# Patient Record
Sex: Male | Born: 1955 | Race: White | Hispanic: No | Marital: Married | Smoking: Never smoker
Health system: Southern US, Community
[De-identification: ages and names within clinical notes are randomized; demographics above are authoritative.]

## PROBLEM LIST (undated history)

## (undated) DIAGNOSIS — I10 Essential (primary) hypertension: Secondary | ICD-10-CM

## (undated) DIAGNOSIS — A498 Other bacterial infections of unspecified site: Secondary | ICD-10-CM

## (undated) HISTORY — PX: APPENDECTOMY: SHX54

## (undated) HISTORY — PX: KNEE SURGERY: SHX244

## (undated) HISTORY — PX: TONSILLECTOMY: SUR1361

---

## 2013-04-23 ENCOUNTER — Emergency Department: Payer: Self-pay | Admitting: Emergency Medicine

## 2013-04-23 LAB — CBC
HCT: 46.4 % (ref 40.0–52.0)
HGB: 16.2 g/dL (ref 13.0–18.0)
MCH: 33.5 pg (ref 26.0–34.0)
MCHC: 34.9 g/dL (ref 32.0–36.0)
MCV: 96 fL (ref 80–100)
Platelet: 188 10*3/uL (ref 150–440)
RBC: 4.83 10*6/uL (ref 4.40–5.90)
RDW: 13 % (ref 11.5–14.5)
WBC: 9.4 10*3/uL (ref 3.8–10.6)

## 2013-04-23 LAB — COMPREHENSIVE METABOLIC PANEL
ALBUMIN: 4.1 g/dL (ref 3.4–5.0)
ALK PHOS: 72 U/L
ALT: 34 U/L (ref 12–78)
ANION GAP: 7 (ref 7–16)
BUN: 6 mg/dL — ABNORMAL LOW (ref 7–18)
Bilirubin,Total: 0.9 mg/dL (ref 0.2–1.0)
Calcium, Total: 8.2 mg/dL — ABNORMAL LOW (ref 8.5–10.1)
Chloride: 95 mmol/L — ABNORMAL LOW (ref 98–107)
Co2: 26 mmol/L (ref 21–32)
Creatinine: 0.67 mg/dL (ref 0.60–1.30)
EGFR (African American): >60
EGFR (Non-African Amer.): >60
GLUCOSE: 98 mg/dL (ref 65–99)
Osmolality: 255 (ref 275–301)
Potassium: 3.6 mmol/L (ref 3.5–5.1)
SGOT(AST): 25 U/L (ref 15–37)
Sodium: 128 mmol/L — ABNORMAL LOW (ref 136–145)
Total Protein: 7.6 g/dL (ref 6.4–8.2)

## 2013-04-23 LAB — TROPONIN I: Troponin-I: <0.02 ng/mL

## 2013-05-22 ENCOUNTER — Ambulatory Visit: Payer: Self-pay | Admitting: Otolaryngology

## 2015-01-26 ENCOUNTER — Emergency Department
Admission: EM | Admit: 2015-01-26 | Discharge: 2015-01-26 | Disposition: A | Discharge status: Home / Self Care | Payer: BLUE CROSS/BLUE SHIELD | Attending: Emergency Medicine | Admitting: Emergency Medicine

## 2015-01-26 ENCOUNTER — Encounter: Payer: Self-pay | Admitting: Emergency Medicine

## 2015-01-26 DIAGNOSIS — I10 Essential (primary) hypertension: Secondary | ICD-10-CM | POA: Diagnosis not present

## 2015-01-26 DIAGNOSIS — Y9389 Activity, other specified: Secondary | ICD-10-CM | POA: Insufficient documentation

## 2015-01-26 DIAGNOSIS — Y998 Other external cause status: Secondary | ICD-10-CM | POA: Diagnosis not present

## 2015-01-26 DIAGNOSIS — S99921A Unspecified injury of right foot, initial encounter: Secondary | ICD-10-CM | POA: Diagnosis present

## 2015-01-26 DIAGNOSIS — Y9289 Other specified places as the place of occurrence of the external cause: Secondary | ICD-10-CM | POA: Insufficient documentation

## 2015-01-26 DIAGNOSIS — W208XXA Other cause of strike by thrown, projected or falling object, initial encounter: Secondary | ICD-10-CM | POA: Insufficient documentation

## 2015-01-26 DIAGNOSIS — S92321A Displaced fracture of second metatarsal bone, right foot, initial encounter for closed fracture: Secondary | ICD-10-CM | POA: Diagnosis not present

## 2015-01-26 DIAGNOSIS — S92301A Fracture of unspecified metatarsal bone(s), right foot, initial encounter for closed fracture: Secondary | ICD-10-CM

## 2015-01-26 HISTORY — DX: Essential (primary) hypertension: I10

## 2015-01-26 MED ORDER — OXYCODONE-ACETAMINOPHEN 5-325 MG PO TABS
1.0000 | ORAL_TABLET | Freq: Once | ORAL | Status: AC
  Administered 2015-01-26: 1 via ORAL
  Filled 2015-01-26: qty 1

## 2015-01-26 MED ORDER — OXYCODONE-ACETAMINOPHEN 7.5-325 MG PO TABS: 1.0000 | ORAL_TABLET | Freq: Four times a day (QID) | ORAL | Status: DC | PRN

## 2015-01-26 MED ORDER — IBUPROFEN 800 MG PO TABS
800.0000 mg | ORAL_TABLET | Freq: Once | ORAL | Status: AC
  Administered 2015-01-26: 800 mg via ORAL
  Filled 2015-01-26: qty 1

## 2015-01-26 MED ORDER — IBUPROFEN 800 MG PO TABS: 800.0000 mg | ORAL_TABLET | Freq: Three times a day (TID) | ORAL | Status: DC | PRN

## 2015-01-26 NOTE — ED Notes (Signed)
States he had a block of wood drop on right foot  Sent in from [facility]

## 2015-01-26 NOTE — Discharge Instructions (Signed)
Metatarsal Fracture A metatarsal fracture is a break in a metatarsal bone. Metatarsal bones connect your toe bones to your ankle bones. CAUSES This type of fracture may be caused by:  A sudden twisting of your foot.  A fall onto your foot.  Overuse or repetitive exercise. RISK FACTORS This condition is more likely to develop in people who:  Play contact sports.  Have a bone disease.  Have a low calcium level. SYMPTOMS Symptoms of this condition include:  Pain that is worse when walking or standing.  Pain when pressing on the foot or moving the toes.  Swelling.  Bruising on the top or bottom of the foot.  A foot that appears shorter than the other one. DIAGNOSIS This condition is diagnosed with a physical exam. You may also have imaging tests, such as:  X-rays.  A CT scan.  MRI. TREATMENT Treatment for this condition depends on its severity and whether a bone has moved out of place. Treatment may involve:  Rest.  Wearing foot support such as a cast, splint, or boot for several weeks.  Using crutches.  Surgery to move bones back into the right position. Surgery is usually needed if there are many pieces of broken bone or bones that are very out of place (displaced fracture).  Physical therapy. This may be needed to help you regain full movement and strength in your foot. You will need to return to your health care provider to have X-rays taken until your bones heal. Your health care provider will look at the X-rays to make sure that your foot is healing well. HOME CARE INSTRUCTIONS  If You Have a Cast:  Do not stick anything inside the cast to scratch your skin. Doing that increases your risk of infection.  Check the skin around the cast every day. Report any concerns to your health care provider. You may put lotion on dry skin around the edges of the cast. Do not apply lotion to the skin underneath the cast.  Keep the cast clean and dry. If You Have a Splint  or a Supportive Boot:  Wear it as directed by your health care provider. Remove it only as directed by your health care provider.  Loosen it if your toes become numb and tingle, or if they turn cold and blue.  Keep it clean and dry. Bathing  Do not take baths, swim, or use a hot tub until your health care provider approves. Ask your health care provider if you can take showers. You may only be allowed to take sponge baths for bathing.  If your health care provider approves bathing and showering, cover the cast or splint with a watertight plastic bag to protect it from water. Do not let the cast or splint get wet. Managing Pain, Stiffness, and Swelling  If directed, apply ice to the injured area (if you have a splint, not a cast).  Put ice in a plastic bag.  Place a towel between your skin and the bag.  Leave the ice on for 20 minutes, 2-3 times per day.  Move your toes often to avoid stiffness and to lessen swelling.  Raise (elevate) the injured area above the level of your heart while you are sitting or lying down. Driving  Do not drive or operate heavy machinery while taking pain medicine.  Do not drive while wearing foot support on a foot that you use for driving. Activity  Return to your normal activities as directed by your health care   provider. Ask your health care provider what activities are safe for you.  Perform exercises as directed by your health care provider or physical therapist. Safety  Do not use the injured foot to support your body weight until your health care provider says that you can. Use crutches as directed by your health care provider. General Instructions  Do not put pressure on any part of the cast or splint until it is fully hardened. This may take several hours.  Do not use any tobacco products, including cigarettes, chewing tobacco, or e-cigarettes. Tobacco can delay bone healing. If you need help quitting, ask your health care  provider.  Take medicines only as directed by your health care provider.  Keep all follow-up visits as directed by your health care provider. This is important. SEEK MEDICAL CARE IF:  You have a fever.  Your cast, splint, or boot is too loose or too tight.  Your cast, splint, or boot is damaged.  Your pain medicine is not helping.  You have pain, tingling, or numbness in your foot that is not going away. SEEK IMMEDIATE MEDICAL CARE IF:  You have severe pain.  You have tingling or numbness in your foot that is getting worse.  Your foot feels cold or becomes numb.  Your foot changes color.   This information is not intended to replace advice given to you by your health care provider. Make sure you discuss any questions you have with your health care provider.   Document Released: 11/11/2001 Document Revised: 07/06/2014 Document Reviewed: 12/16/2013 Elsevier Interactive Patient Education 2016 [employer]  

## 2015-01-26 NOTE — ED Provider Notes (Signed)
[facility] Emergency Department Provider Note  ____________________________________________  Time seen: Approximately 11:24 AM  I have reviewed the triage vital signs and the nursing notes.   HISTORY  Chief Complaint Foot Pain    HPI Madison HickmanDonald L Dykema is a 59 y.o. male patient complaining of right foot pain secondary dropped a block of wood on his foot. Patient stated there was pain mild edema but he was able to ambulate and went to work whole shift that evening. Patient states pain continued he went to an urgent care clinic and x-rays were taken x-rays reveal a mild displaced fracture involving the distal portion of the right second metatarsal. It was noted fractures that involve the metatarsophalangeal joint. Patient was sent from urgent care to the ER for further evaluation and treatment. Patient was given crutches with nonweightbearing by urgent care clinic. No medications were prescribed. Patient is rating his pain discomfort as  8/10.  Past Medical History  Diagnosis Date  . Hypertension     There are no active problems to display for this patient.   Past Surgical History  Procedure Laterality Date  . Tonsillectomy    . Appendectomy    . Knee surgery Left     No current outpatient prescriptions on file.  Allergies Review of patient's allergies indicates no known allergies.  No family history on file.  Social History Social History  Substance Use Topics  . Smoking status: Never Smoker   . Smokeless tobacco: None  . Alcohol Use: No    Review of Systems Constitutional: No fever/chills Eyes: No visual changes. ENT: No sore throat. Cardiovascular: Denies chest pain. Respiratory: Denies shortness of breath. Gastrointestinal: No abdominal pain.  No nausea, no vomiting.  No diarrhea.  No constipation. Genitourinary: Negative for dysuria. Musculoskeletal: Right foot pain Skin: Negative for rash. Neurological: Negative for headaches, focal  weakness or numbness. Endocrine:Hypertension 10-point ROS otherwise negative.  ____________________________________________   PHYSICAL EXAM:  VITAL SIGNS: ED Triage Vitals  Enc Vitals Group     BP 01/26/15 1108 146/72 mmHg     Pulse Rate 01/26/15 1108 92     Resp 01/26/15 1108 18     Temp 01/26/15 1108 98.2 F (36.8 C)     Temp Source 01/26/15 1108 Oral     SpO2 01/26/15 1108 98 %     Weight 01/26/15 1108 166 lb (75.297 kg)     Height 01/26/15 1108 5\' 9"  (1.753 m)     Head Cir --      Peak Flow --      Pain Score 01/26/15 1108 8     Pain Loc --      Pain Edu? --      Excl. in GC? --     Constitutional: Alert and oriented. Well appearing and in no acute distress. Eyes: Conjunctivae are normal. PERRL. EOMI. Head: Atraumatic. Nose: No congestion/rhinnorhea. Mouth/Throat: Mucous membranes are moist.  Oropharynx non-erythematous. Neck: No stridor. No cervical spine tenderness to palpation. Hematological/Lymphatic/Immunilogical: No cervical lymphadenopathy. Cardiovascular: Normal rate, regular rhythm. Grossly normal heart sounds.  Good peripheral circulation. Respiratory: Normal respiratory effort.  No retractions. Lungs CTAB. Gastrointestinal: Soft and nontender. No distention. No abdominal bruits. No CVA tenderness. Musculoskeletal: No lower extremity tenderness nor edema.  No joint effusions. Neurologic:  Normal speech and language. No gross focal neurologic deficits are appreciated. No gait instability. Skin:  Skin is warm, dry and intact. No rash noted. Psychiatric: Mood and affect are normal. Speech and behavior are normal.  ____________________________________________   LABS (all labs ordered are listed, but only abnormal results are displayed)  Labs Reviewed - No data to display ____________________________________________  EKG   ____________________________________________  RADIOLOGY  Reviewed x-ray findings from urgent care clinic  . ____________________________________________   PROCEDURES  Procedure(s) performed: None  Critical Care performed: No  ____________________________________________   INITIAL IMPRESSION / ASSESSMENT AND PLAN / ED COURSE  Pertinent labs & imaging results that were available during my care of the patient were reviewed by me and considered in my medical decision making (see chart for details).  Right mild displaced second metatarsal fracture. Patient given a cast shoe and advised to continue ambulating with supportive crutches. Patient given a prescription for Percocets and ibuprofen. Patient advised to contact Dr. Rondel Baton office for follow-up appointment. ____________________________________________   FINAL CLINICAL IMPRESSION(S) / ED DIAGNOSES  Final diagnoses:  Fracture of second metatarsal bone, right, closed, initial encounter      Joni Reining, PA-C 01/26/15 1146  Sharman Cheek, MD 01/26/15 1527

## 2015-09-18 ENCOUNTER — Other Ambulatory Visit: Payer: Self-pay | Admitting: Family Medicine

## 2015-11-03 ENCOUNTER — Other Ambulatory Visit: Payer: Self-pay | Admitting: Family Medicine

## 2015-11-04 ENCOUNTER — Other Ambulatory Visit: Payer: Self-pay | Admitting: Family Medicine

## 2016-02-04 ENCOUNTER — Other Ambulatory Visit: Payer: Self-pay | Admitting: Family Medicine

## 2016-05-06 ENCOUNTER — Other Ambulatory Visit: Payer: Self-pay | Admitting: Family Medicine

## 2016-06-08 ENCOUNTER — Other Ambulatory Visit: Payer: Self-pay | Admitting: Family Medicine

## 2016-07-08 ENCOUNTER — Other Ambulatory Visit: Payer: Self-pay | Admitting: Family Medicine

## 2016-07-12 NOTE — Telephone Encounter (Signed)
Patient has not seen bob in over 2 years. He needs to schedule follow up with Nadine CountsBob before we can approve prescription refill.

## 2016-07-13 NOTE — Telephone Encounter (Signed)
[id number] 

## 2016-07-17 NOTE — Telephone Encounter (Signed)
Patient scheduled for 07/20/16

## 2016-07-18 ENCOUNTER — Telehealth: Payer: Self-pay | Admitting: Family Medicine

## 2016-07-18 ENCOUNTER — Other Ambulatory Visit: Payer: Self-pay | Admitting: Family Medicine

## 2016-07-18 NOTE — Telephone Encounter (Signed)
Please review. KW 

## 2016-07-18 NOTE — Telephone Encounter (Signed)
I sent this in yesterday

## 2016-07-18 NOTE — Telephone Encounter (Signed)
[facility] pharmacy faxed a request for the following medication. Thanks CC ° ° °lisinopril (PRINIVIL,ZESTRIL) 10 MG tablet  °Take 1 tablet by mouth every day. °

## 2016-07-20 ENCOUNTER — Ambulatory Visit: Payer: Self-pay | Admitting: Family Medicine

## 2016-08-10 ENCOUNTER — Encounter: Payer: Self-pay | Admitting: Family Medicine

## 2016-08-10 ENCOUNTER — Other Ambulatory Visit: Payer: Self-pay | Admitting: Family Medicine

## 2016-08-10 ENCOUNTER — Ambulatory Visit (INDEPENDENT_AMBULATORY_CARE_PROVIDER_SITE_OTHER): Payer: BLUE CROSS/BLUE SHIELD | Admitting: Family Medicine

## 2016-08-10 VITALS — BP 166/96 | HR 78 | Temp 97.9°F | Resp 16 | Wt 167.0 lb

## 2016-08-10 DIAGNOSIS — E785 Hyperlipidemia, unspecified: Secondary | ICD-10-CM | POA: Insufficient documentation

## 2016-08-10 DIAGNOSIS — I10 Essential (primary) hypertension: Secondary | ICD-10-CM | POA: Diagnosis not present

## 2016-08-10 HISTORY — DX: Hyperlipidemia, unspecified: E78.5

## 2016-08-10 MED ORDER — LISINOPRIL 20 MG PO TABS
20.0000 mg | ORAL_TABLET | Freq: Every day | ORAL | 3 refills | Status: DC
Start: 2016-08-10 — End: 2017-08-12

## 2016-08-10 NOTE — Progress Notes (Signed)
Subjective:     Patient ID: Madison HickmanDonald L Pasquarello, male   DOB: 08/31/1955, 61 y.o.   MRN: [id number]  HPI  Chief Complaint  Patient presents with  . 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 He has been taking lisinopril 10 mg for many years and thinks he needs his medication adjusted.   Reports compliance with medication. Defers lipid screen: "I don't want to be on cholesterol medication" and defers other health maintenance at this time. 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   Review of Systems  Respiratory: Negative for shortness of breath.   Cardiovascular: Negative for chest pain and palpitations.       Objective:   Physical Exam  Constitutional: He appears well-developed and well-nourished. No distress.  Cardiovascular: Normal rate and regular rhythm.   Pulmonary/Chest: Breath sounds normal.  Musculoskeletal: He exhibits no edema (of lower extremities).       Assessment:    1. Essential hypertension; increase dose for improved control - lisinopril (PRINIVIL,ZESTRIL) 20 MG tablet; Take 1 tablet (20 mg total) by mouth daily.  Dispense: 90 tablet; Refill: 3 - Renal function panel    Plan:    Further f/u pending lab work.

## 2016-08-10 NOTE — Patient Instructions (Signed)
We will call you with the lab results. 

## 2016-09-07 ENCOUNTER — Ambulatory Visit: Payer: BLUE CROSS/BLUE SHIELD | Admitting: Family Medicine

## 2017-08-12 ENCOUNTER — Other Ambulatory Visit: Payer: Self-pay | Admitting: Family Medicine

## 2017-08-12 DIAGNOSIS — I10 Essential (primary) hypertension: Secondary | ICD-10-CM

## 2017-11-07 ENCOUNTER — Other Ambulatory Visit: Payer: Self-pay | Admitting: Family Medicine

## 2017-11-07 DIAGNOSIS — I10 Essential (primary) hypertension: Secondary | ICD-10-CM

## 2019-03-19 ENCOUNTER — Ambulatory Visit: Payer: BC Managed Care – PPO | Admitting: Adult Health

## 2019-03-19 ENCOUNTER — Other Ambulatory Visit: Payer: Self-pay

## 2019-03-19 ENCOUNTER — Encounter: Payer: Self-pay | Admitting: Adult Health

## 2019-03-19 VITALS — BP 130/102 | HR 76 | Temp 96.8°F | Resp 16 | Wt 183.2 lb

## 2019-03-19 DIAGNOSIS — Z13220 Encounter for screening for lipoid disorders: Secondary | ICD-10-CM

## 2019-03-19 DIAGNOSIS — F419 Anxiety disorder, unspecified: Secondary | ICD-10-CM

## 2019-03-19 DIAGNOSIS — Z1329 Encounter for screening for other suspected endocrine disorder: Secondary | ICD-10-CM | POA: Diagnosis not present

## 2019-03-19 DIAGNOSIS — H5461 Unqualified visual loss, right eye, normal vision left eye: Secondary | ICD-10-CM

## 2019-03-19 DIAGNOSIS — H547 Unspecified visual loss: Secondary | ICD-10-CM

## 2019-03-19 DIAGNOSIS — I10 Essential (primary) hypertension: Secondary | ICD-10-CM | POA: Diagnosis not present

## 2019-03-19 DIAGNOSIS — Z125 Encounter for screening for malignant neoplasm of prostate: Secondary | ICD-10-CM

## 2019-03-19 MED ORDER — LISINOPRIL 20 MG PO TABS: 20.0000 mg | ORAL_TABLET | Freq: Every day | ORAL | 0 refills | Status: DC

## 2019-03-19 NOTE — Patient Instructions (Addendum)
[facility] online can schedule appointment for counseling and even psychiatric care if needed. Counseling always recommended.  Will start Lexapro if labs ok and cleared with [facility].  Records requested.  Monitor blood pressure at home,    Lisinopril Tablets What is this medicine? LISINOPRIL (lyse IN oh pril) is an ACE inhibitor. It treats high blood pressure and heart failure. It can treat heart damage after a heart attack. This medicine may be used for other purposes; ask your health care provider or pharmacist if you have questions. COMMON BRAND NAME(S): Prinivil, Zestril What should I tell my health care provider before I take this medicine? They need to know if you have any of these conditions:  diabetes  heart or blood vessel disease  kidney disease  low blood pressure  previous swelling of the tongue, face, or lips with difficulty breathing, difficulty swallowing, hoarseness, or tightening of the throat  an unusual or allergic reaction to lisinopril, other ACE inhibitors, insect venom, foods, dyes, or preservatives  pregnant or trying to get pregnant  breast-feeding How should I use this medicine? Take this drug by mouth. Take it as directed on the prescription label at the same time every day. You can take it with or without food. If it upsets your stomach, take it with food. Keep taking it unless your health care provider tells you to stop. Talk to your health care provider about the use of this drug in children. While it may be prescribed for children as young as 6 for selected conditions, precautions do apply. Overdosage: If you think you have taken too much of this medicine contact a poison control center or emergency room at once. NOTE: This medicine is only for you. Do not share this medicine with others. What if I miss a dose? If you miss a dose, take it as soon as you can. If it is almost time for your next dose, take only that dose.  Do not take double or extra doses. What may interact with this medicine? Do not take this medicine with any of the following medications:  hymenoptera venom  sacubitril; valsartan This medicines may also interact with the following medications:  aliskiren  angiotensin receptor blockers, like losartan or valsartan  certain medicines for diabetes  diuretics  everolimus  gold compounds  lithium  NSAIDs, medicines for pain and inflammation, like ibuprofen or naproxen  potassium salts or supplements  salt substitutes  sirolimus  temsirolimus This list may not describe all possible interactions. Give your health care provider a list of all the medicines, herbs, non-prescription drugs, or dietary supplements you use. Also tell them if you smoke, drink alcohol, or use illegal drugs. Some items may interact with your medicine. What should I watch for while using this medicine? Visit your doctor or health care professional for regular check ups. Check your blood pressure as directed. Ask your doctor what your blood pressure should be, and when you should contact him or her. Do not treat yourself for coughs, colds, or pain while you are using this medicine without asking your doctor or health care professional for advice. Some ingredients may increase your blood pressure. Women should inform their doctor if they wish to become pregnant or think they might be pregnant. There is a potential for serious side effects to an unborn child. Talk to your health care professional or pharmacist for more information. Check with your doctor or health care professional if you get an attack of severe  diarrhea, nausea and vomiting, or if you sweat a lot. The loss of too much body fluid can make it dangerous for you to take this medicine. You may get drowsy or dizzy. Do not drive, use machinery, or do anything that needs mental alertness until you know how this drug affects you. Do not stand or sit up  quickly, especially if you are an older patient. This reduces the risk of dizzy or fainting spells. Alcohol can make you more drowsy and dizzy. Avoid alcoholic drinks. Avoid salt substitutes unless you are told otherwise by your doctor or health care professional. What side effects may I notice from receiving this medicine? Side effects that you should report to your doctor or health care professional as soon as possible:  allergic reactions like skin rash, itching or hives, swelling of the hands, feet, face, lips, throat, or tongue  breathing problems  signs and symptoms of kidney injury like trouble passing urine or change in the amount of urine  signs and symptoms of increased potassium like muscle weakness; chest pain; or fast, irregular heartbeat  signs and symptoms of liver injury like dark yellow or brown urine; general ill feeling or flu-like symptoms; light-colored stools; loss of appetite; nausea; right upper belly pain; unusually weak or tired; yellowing of the eyes or skin  signs and symptoms of low blood pressure like dizziness; feeling faint or lightheaded, falls; unusually weak or tired  stomach pain with or without nausea and vomiting Side effects that usually do not require medical attention (report to your doctor or health care professional if they continue or are bothersome):  changes in taste  cough  dizziness  fever  headache  sensitivity to light This list may not describe all possible side effects. Call your doctor for medical advice about side effects. You may report side effects to FDA at 1-800-FDA-1088. Where should I keep my medicine? Keep out of the reach of children and pets. Store at room temperature between 20 and 25 degrees C (68 and 77 degrees F). Protect from moisture. Keep the container tightly closed. Do not freeze. Avoid exposure to extreme heat. Throw away any unused drug after the expiration date. NOTE: This sheet is a summary. It may not  cover all possible information. If you have questions about this medicine, talk to your doctor, pharmacist, or health care provider.  2020 Elsevier/Gold Standard (2018-09-24 11:38:35) Hypertension, Adult Hypertension is another name for high blood pressure. High blood pressure forces your heart to work harder to pump blood. This can cause problems over time. There are two numbers in a blood pressure reading. There is a top number (systolic) over a bottom number (diastolic). It is best to have a blood pressure that is below 120/80. Healthy choices can help lower your blood pressure, or you may need medicine to help lower it. What are the causes? The cause of this condition is not known. Some conditions may be related to high blood pressure. What increases the risk?  Smoking.  Having type 2 diabetes mellitus, high cholesterol, or both.  Not getting enough exercise or physical activity.  Being overweight.  Having too much fat, sugar, calories, or salt (sodium) in your diet.  Drinking too much alcohol.  Having long-term (chronic) kidney disease.  Having a family history of high blood pressure.  Age. Risk increases with age.  Race. You may be at higher risk if you are African American.  Gender. Men are at higher risk than women before age 64. After  age 29, women are at higher risk than men.  Having obstructive sleep apnea.  Stress. What are the signs or symptoms?  High blood pressure may not cause symptoms. Very high blood pressure (hypertensive crisis) may cause: ? Headache. ? Feelings of worry or nervousness (anxiety). ? Shortness of breath. ? Nosebleed. ? A feeling of being sick to your stomach (nausea). ? Throwing up (vomiting). ? Changes in how you see. ? Very bad chest pain. ? Seizures. How is this treated?  This condition is treated by making healthy lifestyle changes, such as: ? Eating healthy foods. ? Exercising more. ? Drinking less alcohol.  Your health  care provider may prescribe medicine if lifestyle changes are not enough to get your blood pressure under control, and if: ? Your top number is above 130. ? Your bottom number is above 80.  Your personal target blood pressure may vary. Follow these instructions at home: Eating and drinking   If told, follow the DASH eating plan. To follow this plan: ? Fill one half of your plate at each meal with fruits and vegetables. ? Fill one fourth of your plate at each meal with whole grains. Whole grains include whole-wheat pasta, brown rice, and whole-grain bread. ? Eat or drink low-fat dairy products, such as skim milk or low-fat yogurt. ? Fill one fourth of your plate at each meal with low-fat (lean) proteins. Low-fat proteins include fish, chicken without skin, eggs, beans, and tofu. ? Avoid fatty meat, cured and processed meat, or chicken with skin. ? Avoid pre-made or processed food.  Eat less than 1,500 mg of salt each day.  Do not drink alcohol if: ? Your doctor tells you not to drink. ? You are pregnant, may be pregnant, or are planning to become pregnant.  If you drink alcohol: ? Limit how much you use to:  0-1 drink a day for women.  0-2 drinks a day for men. ? Be aware of how much alcohol is in your drink. In the [place] one drink equals one 12 oz bottle of beer (355 mL), one 5 oz glass of wine (148 mL), or one 1 oz glass of hard liquor (44 mL). Lifestyle   Work with your doctor to stay at a healthy weight or to lose weight. Ask your doctor what the best weight is for you.  Get at least 30 minutes of exercise most days of the week. This may include walking, swimming, or biking.  Get at least 30 minutes of exercise that strengthens your muscles (resistance exercise) at least 3 days a week. This may include lifting weights or doing Pilates.  Do not use any products that contain nicotine or tobacco, such as cigarettes, e-cigarettes, and chewing tobacco. If you need help quitting,  ask your doctor.  Check your blood pressure at home as told by your doctor.  Keep all follow-up visits as told by your doctor. This is important. Medicines  Take over-the-counter and prescription medicines only as told by your doctor. Follow directions carefully.  Do not skip doses of blood pressure medicine. The medicine does not work as well if you skip doses. Skipping doses also puts you at risk for problems.  Ask your doctor about side effects or reactions to medicines that you should watch for. Contact a doctor if you:  Think you are having a reaction to the medicine you are taking.  Have headaches that keep coming back (recurring).  Feel dizzy.  Have swelling in your ankles.  Have trouble  with your vision. Get help right away if you:  Get a very bad headache.  Start to feel mixed up (confused).  Feel weak or numb.  Feel faint.  Have very bad pain in your: ? Chest. ? Belly (abdomen).  Throw up more than once.  Have trouble breathing. Summary  Hypertension is another name for high blood pressure.  High blood pressure forces your heart to work harder to pump blood.  For most people, a normal blood pressure is less than 120/80.  Making healthy choices can help lower blood pressure. If your blood pressure does not get lower with healthy choices, you may need to take medicine. This information is not intended to replace advice given to you by your health care provider. Make sure you discuss any questions you have with your health care provider. Document Revised: 10/30/2017 Document Reviewed: 10/30/2017 Elsevier Patient Education  [place]. Generalized Anxiety Disorder, Adult Generalized anxiety disorder (GAD) is a mental health disorder. People with this condition constantly worry about everyday events. Unlike normal anxiety, worry related to GAD is not triggered by a specific event. These worries also do not fade or get better with time. GAD interferes  with life functions, including relationships, work, and school. GAD can vary from mild to severe. People with severe GAD can have intense waves of anxiety with physical symptoms (panic attacks). What are the causes? The exact cause of GAD is not known. What increases the risk? This condition is more likely to develop in:  Women.  People who have a family history of anxiety disorders.  People who are very shy.  People who experience very stressful life events, such as the death of a loved one.  People who have a very stressful family environment. What are the signs or symptoms? People with GAD often worry excessively about many things in their lives, such as their health and family. They may also be overly concerned about:  Doing well at work.  Being on time.  Natural disasters.  Friendships. Physical symptoms of GAD include:  Fatigue.  Muscle tension or having muscle twitches.  Trembling or feeling shaky.  Being easily startled.  Feeling like your heart is pounding or racing.  Feeling out of breath or like you cannot take a deep breath.  Having trouble falling asleep or staying asleep.  Sweating.  Nausea, diarrhea, or irritable bowel syndrome (IBS).  Headaches.  Trouble concentrating or remembering facts.  Restlessness.  Irritability. How is this diagnosed? Your health care provider can diagnose GAD based on your symptoms and medical history. You will also have a physical exam. The health care provider will ask specific questions about your symptoms, including how severe they are, when they started, and if they come and go. Your health care provider may ask you about your use of alcohol or drugs, including prescription medicines. Your health care provider may refer you to a mental health specialist for further evaluation. Your health care provider will do a thorough examination and may perform additional tests to rule out other possible causes of your  symptoms. To be diagnosed with GAD, a person must have anxiety that:  Is out of his or her control.  Affects several different aspects of his or her life, such as work and relationships.  Causes distress that makes him or her unable to take part in normal activities.  Includes at least three physical symptoms of GAD, such as restlessness, fatigue, trouble concentrating, irritability, muscle tension, or sleep problems. Before your  health care provider can confirm a diagnosis of GAD, these symptoms must be present more days than they are not, and they must last for six months or longer. How is this treated? The following therapies are usually used to treat GAD:  Medicine. Antidepressant medicine is usually prescribed for long-term daily control. Antianxiety medicines may be added in severe cases, especially when panic attacks occur.  Talk therapy (psychotherapy). Certain types of talk therapy can be helpful in treating GAD by providing support, education, and guidance. Options include: ? Cognitive behavioral therapy (CBT). People learn coping skills and techniques to ease their anxiety. They learn to identify unrealistic or negative thoughts and behaviors and to replace them with positive ones. ? Acceptance and commitment therapy (ACT). This treatment teaches people how to be mindful as a way to cope with unwanted thoughts and feelings. ? Biofeedback. This process trains you to manage your body's response (physiological response) through breathing techniques and relaxation methods. You will work with a therapist while machines are used to monitor your physical symptoms.  Stress management techniques. These include yoga, meditation, and exercise. A mental health specialist can help determine which treatment is best for you. Some people see improvement with one type of therapy. However, other people require a combination of therapies. Follow these instructions at home:  Take over-the-counter and  prescription medicines only as told by your health care provider.  Try to maintain a normal routine.  Try to anticipate stressful situations and allow extra time to manage them.  Practice any stress management or self-calming techniques as taught by your health care provider.  Do not punish yourself for setbacks or for not making progress.  Try to recognize your accomplishments, even if they are small.  Keep all follow-up visits as told by your health care provider. This is important. Contact a health care provider if:  Your symptoms do not get better.  Your symptoms get worse.  You have signs of depression, such as: ? A persistently sad, cranky, or irritable mood. ? Loss of enjoyment in activities that used to bring you joy. ? Change in weight or eating. ? Changes in sleeping habits. ? Avoiding friends or family members. ? Loss of energy for normal tasks. ? Feelings of guilt or worthlessness. Get help right away if:  You have serious thoughts about hurting yourself or others. If you ever feel like you may hurt yourself or others, or have thoughts about taking your own life, get help right away. You can go to your nearest emergency department or call:  Your local emergency services (911 in the [place]  A suicide crisis helpline, such as the National Suicide Prevention Lifeline at 810-848-6847. This is open 24 hours a day. Summary  Generalized anxiety disorder (GAD) is a mental health disorder that involves worry that is not triggered by a specific event.  People with GAD often worry excessively about many things in their lives, such as their health and family.  GAD may cause physical symptoms such as restlessness, trouble concentrating, sleep problems, frequent sweating, nausea, diarrhea, headaches, and trembling or muscle twitching.  A mental health specialist can help determine which treatment is best for you. Some people see improvement with one type of therapy.  However, other people require a combination of therapies. This information is not intended to replace advice given to you by your health care provider. Make sure you discuss any questions you have with your health care provider. Document Revised: 02/01/2017 Document Reviewed: 01/10/2016 Elsevier Patient Education  2020 Elsevier Inc.  Escitalopram tablets What is this medicine? ESCITALOPRAM (es sye TAL oh pram) is used to treat depression and certain types of anxiety. This medicine may be used for other purposes; ask your health care provider or pharmacist if you have questions. COMMON BRAND NAME(S): Lexapro What should I tell my health care provider before I take this medicine? They need to know if you have any of these conditions:  bipolar disorder or a family history of bipolar disorder  diabetes  glaucoma  heart disease  kidney or liver disease  receiving electroconvulsive therapy  seizures (convulsions)  suicidal thoughts, plans, or attempt by you or a family member  an unusual or allergic reaction to escitalopram, the related drug citalopram, other medicines, foods, dyes, or preservatives  pregnant or trying to become pregnant  breast-feeding How should I use this medicine? Take this medicine by mouth with a glass of water. Follow the directions on the prescription label. You can take it with or without food. If it upsets your stomach, take it with food. Take your medicine at regular intervals. Do not take it more often than directed. Do not stop taking this medicine suddenly except upon the advice of your doctor. Stopping this medicine too quickly may cause serious side effects or your condition may worsen. A special MedGuide will be given to you by the pharmacist with each prescription and refill. Be sure to read this information carefully each time. Talk to your pediatrician regarding the use of this medicine in children. Special care may be needed. Overdosage: If you  think you have taken too much of this medicine contact a poison control center or emergency room at once. NOTE: This medicine is only for you. Do not share this medicine with others. What if I miss a dose? If you miss a dose, take it as soon as you can. If it is almost time for your next dose, take only that dose. Do not take double or extra doses. What may interact with this medicine? Do not take this medicine with any of the following medications:  certain medicines for fungal infections like fluconazole, itraconazole, ketoconazole, posaconazole, voriconazole  cisapride  citalopram  dronedarone  linezolid  MAOIs like Carbex, Eldepryl, Marplan, Nardil, and Parnate  methylene blue (injected into a vein)  pimozide  thioridazine This medicine may also interact with the following medications:  alcohol  amphetamines  aspirin and aspirin-like medicines  carbamazepine  certain medicines for depression, anxiety, or psychotic disturbances  certain medicines for migraine headache like almotriptan, eletriptan, frovatriptan, naratriptan, rizatriptan, sumatriptan, zolmitriptan  certain medicines for sleep  certain medicines that treat or prevent blood clots like warfarin, enoxaparin, dalteparin  cimetidine  diuretics  dofetilide  fentanyl  furazolidone  isoniazid  lithium  metoprolol  NSAIDs, medicines for pain and inflammation, like ibuprofen or naproxen  other medicines that prolong the QT interval (cause an abnormal heart rhythm)  procarbazine  rasagiline  supplements like St. John's wort, kava kava, valerian  tramadol  tryptophan  ziprasidone This list may not describe all possible interactions. Give your health care provider a list of all the medicines, herbs, non-prescription drugs, or dietary supplements you use. Also tell them if you smoke, drink alcohol, or use illegal drugs. Some items may interact with your medicine. What should I watch for  while using this medicine? Tell your doctor if your symptoms do not get better or if they get worse. Visit your doctor or health care professional for regular checks on  your progress. Because it may take several weeks to see the full effects of this medicine, it is important to continue your treatment as prescribed by your doctor. Patients and their families should watch out for new or worsening thoughts of suicide or depression. Also watch out for sudden changes in feelings such as feeling anxious, agitated, panicky, irritable, hostile, aggressive, impulsive, severely restless, overly excited and hyperactive, or not being able to sleep. If this happens, especially at the beginning of treatment or after a change in dose, call your health care professional. Bonita Quin may get drowsy or dizzy. Do not drive, use machinery, or do anything that needs mental alertness until you know how this medicine affects you. Do not stand or sit up quickly, especially if you are an older patient. This reduces the risk of dizzy or fainting spells. Alcohol may interfere with the effect of this medicine. Avoid alcoholic drinks. Your mouth may get dry. Chewing sugarless gum or sucking hard candy, and drinking plenty of water may help. Contact your doctor if the problem does not go away or is severe. What side effects may I notice from receiving this medicine? Side effects that you should report to your doctor or health care professional as soon as possible:  allergic reactions like skin rash, itching or hives, swelling of the face, lips, or tongue  anxious  black, tarry stools  changes in vision  confusion  elevated mood, decreased need for sleep, racing thoughts, impulsive behavior  eye pain  fast, irregular heartbeat  feeling faint or lightheaded, falls  feeling agitated, angry, or irritable  hallucination, loss of contact with reality  loss of balance or coordination  loss of memory  painful or prolonged  erections  restlessness, pacing, inability to keep still  seizures  stiff muscles  suicidal thoughts or other mood changes  trouble sleeping  unusual bleeding or bruising  unusually weak or tired  vomiting Side effects that usually do not require medical attention (report to your doctor or health care professional if they continue or are bothersome):  changes in appetite  change in sex drive or performance  headache  increased sweating  indigestion, nausea  tremors This list may not describe all possible side effects. Call your doctor for medical advice about side effects. You may report side effects to FDA at 1-800-FDA-1088. Where should I keep my medicine? Keep out of reach of children. Store at room temperature between 15 and 30 degrees C (59 and 86 degrees F). Throw away any unused medicine after the expiration date. NOTE: This sheet is a summary. It may not cover all possible information. If you have questions about this medicine, talk to your doctor, pharmacist, or health care provider.  2020 Elsevier/Gold Standard (2018-02-10 11:21:44)

## 2019-03-19 NOTE — Progress Notes (Deleted)
snelle

## 2019-03-19 NOTE — Progress Notes (Signed)
Patient: Elijah Murphy Male    DOB: 02/14/1956   64 y.o.   MRN: [id number] Visit Date: 03/19/2019  Today's Provider: Marcille Buffy, FNP   Chief Complaint  Patient presents with  . Loss of Vision  . Anxiety   Subjective:     Eye Problem  The right eye is affected. This is a new problem. The current episode started 1 to 4 weeks ago (patient states that he went to [facility] 1 week ago and states that he was given a shot in his eye and told to return in one month). The problem has been unchanged. There is no known exposure to pink eye. He does not wear contacts. Associated symptoms include blurred vision. Pertinent negatives include no eye discharge, double vision, eye redness, fever, foreign body sensation, itching, nausea, photophobia, recent URI, vomiting or weakness.  Anxiety Presents for initial visit. Onset was 1 to 6 months ago. The problem has been unchanged. Symptoms include depressed mood, insomnia (patient reports difficulty falling asleep and being restless) and nervous/anxious behavior. Patient reports no chest pain, compulsions, confusion, decreased concentration, dizziness, dry mouth, excessive worry, feeling of choking, hyperventilation, impotence, irritability, malaise, muscle tension, nausea, obsessions, palpitations, panic, restlessness, shortness of breath or suicidal ideas. The quality of sleep is poor. Nighttime awakenings: several.    He sees eye doctor again 5th February and sooner if needed. He denies any changes since seeing eye doctor last.   He woke up and had  lost vision in his right eye, he went to the [facility] and saw Elijah Murphy, This occurred January 4th.He reports he had injections in both eyes that day. He had a small amount of fluid on left eye. Right eye had small bleed he reports on pictures taken by opthalmology. He is unable to see out of right eye. Denies any falling, no changes in behaviors, no headaches, or tunnel vision.  Denies floaters. Denies any injury, chemosis, foreign body or any liquid leaking from eye.  He is unsure of his actual diagnosis.  Records have been requested from [facility]. Eye doctored ordered on script pad patient has with him today's visit.  CBC , A1C , ESR. [facility].   He was taking Lisnopril and quit it for a while he reports.  He has started it back last week.  His blood pressure in the office today is 130/102, he has been back on his lisinopril for 2 days.  He will continue taking lisinopril.  He was seen last in this office 08/10/2016 with Elijah Baltimore PA, at that time he only had a renal function panel ordered as he declined all other labs and it appears that that lab was not drawn as well.  Last noted labs are over 42 years old. He has not had a complete physical exam, unknown last.  He has had increased anxiety with Covid, the happenings in the world today, and with his recent changes to his office and vision. Denies any suicidal/ homicidal ideations or intents.  Denies dizziness, lightheadedness, any oral rectal bleeding. He denies any numbness tingling or paresthesias.  He denies any falls or trauma, denies any injury to his head or eyes. Patient  denies any fever, body aches,chills, rash, chest pain, shortness of breath, nausea, vomiting, or diarrhea.    No Known Allergies   Current Outpatient Medications:  .  lisinopril (  PRINIVIL,ZESTRIL) 20 MG tablet, TAKE 1 TABLET BY MOUTH EVERY DAY, Disp: 90 tablet, Rfl: 0 .  Multiple Vitamin (MULTIVITAMIN) tablet, Take 1 tablet by mouth daily., Disp: , Rfl:   Review of Systems  Constitutional: Negative for activity change, appetite change, chills, diaphoresis, fatigue, fever, irritability and unexpected weight change.  HENT: Negative for congestion, hearing loss, postnasal drip, rhinorrhea, sinus pressure and sinus pain.   Eyes: Positive for blurred vision and visual  disturbance. Negative for double vision, photophobia, pain, discharge, redness and itching.  Respiratory: Negative.  Negative for apnea, cough, choking, chest tightness, shortness of breath, wheezing and stridor.   Cardiovascular: Negative.  Negative for chest pain, palpitations and leg swelling.  Gastrointestinal: Negative.  Negative for blood in stool, nausea, rectal pain and vomiting.  Endocrine: Negative for polydipsia, polyphagia and polyuria.  Genitourinary: Negative for dysuria, flank pain, hematuria and impotence.  Musculoskeletal: Negative.   Skin: Negative.   Neurological: Negative for dizziness, tremors, seizures, syncope, facial asymmetry, speech difficulty, weakness, light-headedness, numbness and headaches.  Hematological: Negative for adenopathy. Does not bruise/bleed easily.  Psychiatric/Behavioral: Negative for agitation, behavioral problems, confusion, decreased concentration, dysphoric mood, hallucinations, self-injury and suicidal ideas. The patient is nervous/anxious and has insomnia (patient reports difficulty falling asleep and being restless). The patient is not hyperactive.     Social History   Tobacco Use  . Smoking status: Never Smoker  . Smokeless tobacco: Current User    Types: Chew  Substance Use Topics  . Alcohol use: Yes    Comment: occasionally      Objective:   BP (!) 130/102   Pulse 76   Temp (!) 96.8 F (36 C) (Oral)   Resp 16   Wt 183 lb 3.2 oz (83.1 kg)   SpO2 98%   BMI 27.05 kg/m  Vitals:   03/19/19 0819  BP: (!) 130/102  Pulse: 76  Resp: 16  Temp: (!) 96.8 F (36 C)  TempSrc: Oral  SpO2: 98%  Weight: 183 lb 3.2 oz (83.1 kg)  Body mass index is 27.05 kg/m.   Physical Exam Vitals and nursing note reviewed.  Constitutional:      General: He is not in acute distress.    Appearance: Normal appearance. He is not ill-appearing, toxic-appearing or diaphoretic.  HENT:     Head: Normocephalic and atraumatic. No raccoon eyes,  Battle's sign, contusion, right periorbital erythema or left periorbital erythema. Hair is normal.     Jaw: There is normal jaw occlusion.     Right Ear: Hearing, tympanic membrane, ear canal and external ear normal. No tenderness. There is no impacted cerumen. Tympanic membrane is not erythematous.     Left Ear: Hearing, tympanic membrane, ear canal and external ear normal. No tenderness. There is no impacted cerumen. Tympanic membrane is not erythematous.     Nose: Nose normal. No congestion or rhinorrhea.     Right Sinus: No maxillary sinus tenderness or frontal sinus tenderness.     Left Sinus: No maxillary sinus tenderness or frontal sinus tenderness.     Mouth/Throat:     Lips: Pink.     Mouth: Mucous membranes are moist.     Palate: No mass and lesions.     Pharynx: Oropharynx is clear. Uvula midline. No oropharyngeal exudate or posterior oropharyngeal erythema.     Tonsils: No tonsillar exudate or tonsillar abscesses.  Eyes:     General: Visual field deficit (right eye  vision loss - he has seen opthamology and recieved injection in  eye he reports this week- awaiting [facility] report ) present. No scleral icterus.       Right eye: No discharge.        Left eye: No discharge.     Extraocular Movements: Extraocular movements intact.     Conjunctiva/sclera: Conjunctivae normal.     Pupils: Pupils are equal, round, and reactive to light.  Neck:     Vascular: No carotid bruit.  Cardiovascular:     Rate and Rhythm: Normal rate and regular rhythm.     Pulses: Normal pulses.     Heart sounds: Normal heart sounds. No murmur. No friction rub. No gallop.   Pulmonary:     Effort: Pulmonary effort is normal. No respiratory distress.     Breath sounds: Normal breath sounds. No stridor. No wheezing, rhonchi or rales.  Chest:     Chest wall: No tenderness.  Abdominal:     General: There is no distension.     Palpations: Abdomen is soft. There is no mass.     Tenderness: There is  no abdominal tenderness. There is no right CVA tenderness, left CVA tenderness, guarding or rebound.     Hernia: No hernia is present.  Musculoskeletal:        General: Normal range of motion.     Cervical back: Normal range of motion and neck supple. No rigidity or tenderness.  Lymphadenopathy:     Cervical: No cervical adenopathy.  Skin:    General: Skin is warm and dry.     Capillary Refill: Capillary refill takes less than 2 seconds.  Neurological:     General: No focal deficit present.     Mental Status: He is alert and oriented to person, place, and time. Mental status is at baseline.     GCS: GCS eye subscore is 4. GCS verbal subscore is 5. GCS motor subscore is 6.     Cranial Nerves: No cranial nerve deficit, dysarthria or facial asymmetry.     Sensory: Sensation is intact. No sensory deficit.     Motor: Motor function is intact. No weakness, abnormal muscle tone or seizure activity.     Coordination: Coordination is intact. Romberg sign negative. Coordination normal. Finger-Nose-Finger Test and Heel to [facility] Test normal. Rapid alternating movements normal.     Gait: Gait normal.     Deep Tendon Reflexes: Reflexes normal.     Reflex Scores:      Tricep reflexes are 2+ on the right side and 2+ on the left side.      Bicep reflexes are 2+ on the right side and 2+ on the left side.      Brachioradialis reflexes are 2+ on the right side and 2+ on the left side.      Patellar reflexes are 2+ on the right side and 2+ on the left side.      Achilles reflexes are 2+ on the right side and 2+ on the left side. Psychiatric:        Mood and Affect: Mood normal.        Behavior: Behavior normal.        Thought Content: Thought content normal.        Judgment: Judgment normal.      No results found for any visits on 03/19/19.     Assessment & Plan    Vision problem - Plan: Visual acuity screening, CBC with Differential/Platelet 005009, Comprehensive Metabolic Panel (CMET), Sed Rate  (ESR), HgB A1c, PSA, TSH,  PTT, INR/PT, Lipid panel  Vision loss, right eye - Plan: Comprehensive Metabolic Panel (CMET), Sed Rate (ESR), HgB A1c, PSA, TSH, PTT, INR/PT, Lipid panel  Essential hypertension - Plan: Lipid panel, lisinopril (ZESTRIL) 20 MG tablet  Screening for thyroid disorder  Screening for malignant neoplasm of prostate - Plan: PSA  Screening for hyperlipidemia - Plan: Lipid panel  Anxiety - Plan: CBC with Differential/Platelet 005009, Comprehensive Metabolic Panel (CMET), Sed Rate (ESR), HgB A1c, PSA, TSH, PTT, INR/PT, Lipid panel  He has been non compliant with any labs in the past. Discussed importance of labs with his new vision change and his medication. He agrees to have labs performed.      1. Vision problem Keep follow up with [facility]. Will wait for records to be sent. No neurological symptoms currently and neurologically intact exam. Will consider neurology referral.  - Visual acuity screening - CBC with Differential/Platelet 005009 - Comprehensive Metabolic Panel (CMET) - Sed Rate (ESR) - HgB A1c - PSA - TSH - PTT - INR/PT - Lipid panel  2. Vision loss, right eye Keep follow up with [facility].  - Comprehensive Metabolic Panel (CMET) - Sed Rate (ESR) - HgB A1c - PSA - TSH - PTT - INR/PT - Lipid panel  3. Essential hypertension Refill given for his medication he had recently stopped, he has been taking expired medication prior to that. No recent follow up's with office.  He will start new scrip. Return in 2 weeks for recheck and keep records.  - Lipid panel - lisinopril (ZESTRIL) 20 MG tablet; Take 1 tablet (20 mg total) by mouth daily.  Dispense: 90 tablet; Refill: 0  4. Screening for thyroid disorder TSH   5. Screening for malignant neoplasm of prostate  - PSA  6. Screening for hyperlipidemia - Lipid panel  7. Anxiety Will await lab results and eye center report. Mild anxiety discussed counseling and red flags.    - CBC  with Differential/Platelet 005009 - Comprehensive Metabolic Panel (CMET) - Sed Rate (ESR) - HgB A1c - PSA - TSH - PTT - INR/PT - Lipid panel  Return in about 2 weeks (around 04/02/2019), or if symptoms worsen or fail to improve, for at any time for any worsening symptoms, Go to Emergency room/ urgent care if worse.  Advised patient call the office or your primary care doctor for an appointment if no improvement within 72 hours or if any symptoms change or worsen at any time  Advised ER or urgent Care if after hours or on weekend. Call 911 for emergency symptoms at any time.Patinet verbalized understanding of all instructions given/reviewed and treatment plan and has no further questions or concerns at this time.      The entirety of the information documented in the History of Present Illness, Review of Systems and Physical Exam were personally obtained by me. Portions of this information were initially documented by the  Certified Medical Assistant whose name is documented in [place] and reviewed by me for thoroughness and accuracy.  I have personally performed the exam and reviewed the chart and it is accurate to the best of my knowledge.  Haematologist has been used and any errors in dictation or transcription are unintentional.  Kelby Aline. [place], [facility]

## 2019-03-19 NOTE — Progress Notes (Signed)
Seen by [facility] and is being followed  there - records requested.

## 2019-03-20 ENCOUNTER — Encounter: Payer: Self-pay | Admitting: Adult Health

## 2019-03-21 LAB — COMPREHENSIVE METABOLIC PANEL
AG Ratio: 2.2 (calc) (ref 1.0–2.5)
ALT: 12 U/L (ref 9–46)
AST: 14 U/L (ref 10–35)
Albumin: 4.3 g/dL (ref 3.6–5.1)
Alkaline phosphatase (APISO): 52 U/L (ref 35–144)
BUN: 9 mg/dL (ref 7–25)
CO2: 26 mmol/L (ref 20–32)
Calcium: 9 mg/dL (ref 8.6–10.3)
Chloride: 98 mmol/L (ref 98–110)
Creat: 0.82 mg/dL (ref 0.70–1.25)
Globulin: 2 g/dL (calc) (ref 1.9–3.7)
Glucose, Bld: 98 mg/dL (ref 65–99)
Potassium: 3.9 mmol/L (ref 3.5–5.3)
Sodium: 132 mmol/L — ABNORMAL LOW (ref 135–146)
Total Bilirubin: 0.8 mg/dL (ref 0.2–1.2)
Total Protein: 6.3 g/dL (ref 6.1–8.1)

## 2019-03-21 LAB — PSA: PSA: 4.1 ng/mL — ABNORMAL HIGH (ref <=4.0)

## 2019-03-21 LAB — LIPID PANEL
Cholesterol: 244 mg/dL — ABNORMAL HIGH (ref <200)
HDL: 59 mg/dL (ref >=40)
LDL Cholesterol (Calc): 165 mg/dL (calc) — ABNORMAL HIGH
Non-HDL Cholesterol (Calc): 185 mg/dL (calc) — ABNORMAL HIGH (ref <130)
Total CHOL/HDL Ratio: 4.1 (calc) (ref <5.0)
Triglycerides: 93 mg/dL (ref <150)

## 2019-03-21 LAB — HEMOGLOBIN A1C
Hgb A1c MFr Bld: 5.2 % of total Hgb (ref <5.7)
Mean Plasma Glucose: 103 (calc)
eAG (mmol/L): 5.7 (calc)

## 2019-03-21 LAB — CBC WITH DIFFERENTIAL/PLATELET
Absolute Monocytes: 356 cells/uL (ref 200–950)
Basophils Absolute: 20 cells/uL (ref 0–200)
Basophils Relative: 0.5 %
Eosinophils Absolute: 48 cells/uL (ref 15–500)
Eosinophils Relative: 1.2 %
HCT: 40.5 % (ref 38.5–50.0)
Hemoglobin: 14.2 g/dL (ref 13.2–17.1)
Lymphs Abs: 1460 cells/uL (ref 850–3900)
MCH: 33.3 pg — ABNORMAL HIGH (ref 27.0–33.0)
MCHC: 35.1 g/dL (ref 32.0–36.0)
MCV: 95.1 fL (ref 80.0–100.0)
MPV: 10.8 fL (ref 7.5–12.5)
Monocytes Relative: 8.9 %
Neutro Abs: 2116 cells/uL (ref 1500–7800)
Neutrophils Relative %: 52.9 %
Platelets: 185 10*3/uL (ref 140–400)
RBC: 4.26 10*6/uL (ref 4.20–5.80)
RDW: 11.7 % (ref 11.0–15.0)
Total Lymphocyte: 36.5 %
WBC: 4 10*3/uL (ref 3.8–10.8)

## 2019-03-21 LAB — TSH: TSH: 1.02 mIU/L (ref 0.40–4.50)

## 2019-03-21 LAB — PROTIME-INR
INR: 1.1
Prothrombin Time: 11.6 s — ABNORMAL HIGH (ref 9.0–11.5)

## 2019-03-21 LAB — APTT: aPTT: 28 s (ref 23–32)

## 2019-03-21 LAB — SEDIMENTATION RATE: Sed Rate: 6 mm/h (ref 0–20)

## 2019-03-23 NOTE — Progress Notes (Signed)
Follow up call- still looking for records from [facility]. Keep follow ups.  If patient having any neurological symptoms or new symptoms from his last office visit he should return to the office.  CBC ok no anemia or infection. Sedimentation rate normal. No diabetes. PSA only mildly elevated at 4.1 within acceptable variation ok to  will recheck in 6 months.   Patients sodium level is mildly decreased , has he been drinking increased amounts of water, intense excercising, alcohol intake ? Looks like at his last labs years ago sodium was low as well. Appears he could be overhydrated by kidney function labs. If drinking large amounts decrease and will recheck lab in 1-2 months. He could add in a sport drink,Pedialyte, pr electrolyte drink for a few weeks daily, just be sure to look at sugar content.   Total cholesterol and LDL elevated.  Discuss lifestyle modification with patient e.g. increase exercise, fiber, fruits, vegetables, lean meat, and omega 3/fish intake and decrease saturated fat.  Statin can be prescribed if patient is in agreement to reduce the risk of cardiovascular disease.

## 2019-04-02 ENCOUNTER — Ambulatory Visit: Payer: Self-pay | Admitting: Adult Health

## 2019-04-06 NOTE — Addendum Note (Signed)
Addended by: Berniece Pap on: 04/06/2019 01:25 PM   Modules accepted: Orders

## 2019-04-13 ENCOUNTER — Ambulatory Visit: Payer: BC Managed Care – PPO | Admitting: Adult Health

## 2019-04-16 ENCOUNTER — Encounter: Payer: Self-pay | Admitting: Adult Health

## 2019-06-08 ENCOUNTER — Other Ambulatory Visit: Payer: Self-pay | Admitting: Adult Health

## 2019-06-08 DIAGNOSIS — I10 Essential (primary) hypertension: Secondary | ICD-10-CM

## 2019-10-17 ENCOUNTER — Encounter: Payer: Self-pay | Admitting: Emergency Medicine

## 2019-10-17 ENCOUNTER — Emergency Department: Payer: BC Managed Care – PPO

## 2019-10-17 ENCOUNTER — Other Ambulatory Visit: Payer: Self-pay

## 2019-10-17 ENCOUNTER — Emergency Department
Admission: EM | Admit: 2019-10-17 | Discharge: 2019-10-17 | Disposition: A | Discharge status: Home / Self Care | Payer: BC Managed Care – PPO | Attending: Emergency Medicine | Admitting: Emergency Medicine

## 2019-10-17 DIAGNOSIS — R112 Nausea with vomiting, unspecified: Secondary | ICD-10-CM | POA: Diagnosis present

## 2019-10-17 DIAGNOSIS — Z20822 Contact with and (suspected) exposure to covid-19: Secondary | ICD-10-CM | POA: Diagnosis not present

## 2019-10-17 DIAGNOSIS — Z79899 Other long term (current) drug therapy: Secondary | ICD-10-CM | POA: Diagnosis not present

## 2019-10-17 DIAGNOSIS — E860 Dehydration: Secondary | ICD-10-CM

## 2019-10-17 DIAGNOSIS — R197 Diarrhea, unspecified: Secondary | ICD-10-CM | POA: Insufficient documentation

## 2019-10-17 DIAGNOSIS — R531 Weakness: Secondary | ICD-10-CM | POA: Insufficient documentation

## 2019-10-17 DIAGNOSIS — R5381 Other malaise: Secondary | ICD-10-CM

## 2019-10-17 DIAGNOSIS — I10 Essential (primary) hypertension: Secondary | ICD-10-CM | POA: Insufficient documentation

## 2019-10-17 LAB — URINALYSIS, COMPLETE (UACMP) WITH MICROSCOPIC
Bacteria, UA: NONE SEEN
Bilirubin Urine: NEGATIVE
Glucose, UA: NEGATIVE mg/dL
Ketones, ur: 20 mg/dL — AB
Nitrite: NEGATIVE
Protein, ur: 100 mg/dL — AB
Specific Gravity, Urine: 1.016 (ref 1.005–1.030)
pH: 6 (ref 5.0–8.0)

## 2019-10-17 LAB — BASIC METABOLIC PANEL
Anion gap: 14 (ref 5–15)
BUN: 22 mg/dL (ref 8–23)
CO2: 22 mmol/L (ref 22–32)
Calcium: 8.1 mg/dL — ABNORMAL LOW (ref 8.9–10.3)
Chloride: 99 mmol/L (ref 98–111)
Creatinine, Ser: 1 mg/dL (ref 0.61–1.24)
GFR calc Af Amer: >60 mL/min (ref >60)
GFR calc non Af Amer: >60 mL/min (ref >60)
Glucose, Bld: 152 mg/dL — ABNORMAL HIGH (ref 70–99)
Potassium: 4.4 mmol/L (ref 3.5–5.1)
Sodium: 135 mmol/L (ref 135–145)

## 2019-10-17 LAB — FIBRIN DERIVATIVES D-DIMER (ARMC ONLY): Fibrin derivatives D-dimer (ARMC): >7500 ng/mL (FEU) — ABNORMAL HIGH (ref 0.00–499.00)

## 2019-10-17 LAB — COMPREHENSIVE METABOLIC PANEL
ALT: 46 U/L — ABNORMAL HIGH (ref 0–44)
AST: 50 U/L — ABNORMAL HIGH (ref 15–41)
Albumin: 4.9 g/dL (ref 3.5–5.0)
Alkaline Phosphatase: 58 U/L (ref 38–126)
Anion gap: 23 — ABNORMAL HIGH (ref 5–15)
BUN: 22 mg/dL (ref 8–23)
CO2: 18 mmol/L — ABNORMAL LOW (ref 22–32)
Calcium: 9.8 mg/dL (ref 8.9–10.3)
Chloride: 91 mmol/L — ABNORMAL LOW (ref 98–111)
Creatinine, Ser: 1.57 mg/dL — ABNORMAL HIGH (ref 0.61–1.24)
GFR calc Af Amer: 53 mL/min — ABNORMAL LOW (ref >60)
GFR calc non Af Amer: 46 mL/min — ABNORMAL LOW (ref >60)
Glucose, Bld: 220 mg/dL — ABNORMAL HIGH (ref 70–99)
Potassium: 3.7 mmol/L (ref 3.5–5.1)
Sodium: 132 mmol/L — ABNORMAL LOW (ref 135–145)
Total Bilirubin: 4.3 mg/dL — ABNORMAL HIGH (ref 0.3–1.2)
Total Protein: 7.8 g/dL (ref 6.5–8.1)

## 2019-10-17 LAB — CBC WITH DIFFERENTIAL/PLATELET
Abs Immature Granulocytes: 0.06 10*3/uL (ref 0.00–0.07)
Basophils Absolute: 0 10*3/uL (ref 0.0–0.1)
Basophils Relative: 0 %
Eosinophils Absolute: 0 10*3/uL (ref 0.0–0.5)
Eosinophils Relative: 0 %
HCT: 52 % (ref 39.0–52.0)
Hemoglobin: 18.5 g/dL — ABNORMAL HIGH (ref 13.0–17.0)
Immature Granulocytes: 1 %
Lymphocytes Relative: 10 %
Lymphs Abs: 1 10*3/uL (ref 0.7–4.0)
MCH: 35.5 pg — ABNORMAL HIGH (ref 26.0–34.0)
MCHC: 35.6 g/dL (ref 30.0–36.0)
MCV: 99.8 fL (ref 80.0–100.0)
Monocytes Absolute: 0.7 10*3/uL (ref 0.1–1.0)
Monocytes Relative: 7 %
Neutro Abs: 8.2 10*3/uL — ABNORMAL HIGH (ref 1.7–7.7)
Neutrophils Relative %: 82 %
Platelets: 211 10*3/uL (ref 150–400)
RBC: 5.21 MIL/uL (ref 4.22–5.81)
RDW: 12.5 % (ref 11.5–15.5)
WBC: 9.9 10*3/uL (ref 4.0–10.5)
nRBC: 0 % (ref 0.0–0.2)

## 2019-10-17 LAB — SARS CORONAVIRUS 2 BY RT PCR (HOSPITAL ORDER, PERFORMED IN ~~LOC~~ HOSPITAL LAB): SARS Coronavirus 2: NEGATIVE

## 2019-10-17 LAB — TROPONIN I (HIGH SENSITIVITY)
Troponin I (High Sensitivity): 34 ng/L — ABNORMAL HIGH (ref <18)
Troponin I (High Sensitivity): 20 ng/L — ABNORMAL HIGH (ref <18)

## 2019-10-17 LAB — LIPASE, BLOOD: Lipase: 39 U/L (ref 11–51)

## 2019-10-17 MED ORDER — CEPHALEXIN 500 MG PO CAPS: 500.0000 mg | ORAL_CAPSULE | Freq: Four times a day (QID) | ORAL | 0 refills | Status: AC

## 2019-10-17 MED ORDER — IOHEXOL 350 MG/ML SOLN
75.0000 mL | Freq: Once | INTRAVENOUS | Status: AC | PRN
  Administered 2019-10-17: 75 mL via INTRAVENOUS

## 2019-10-17 MED ORDER — SODIUM CHLORIDE 0.9 % IV BOLUS
1000.0000 mL | Freq: Once | INTRAVENOUS | Status: AC
  Administered 2019-10-17: 1000 mL via INTRAVENOUS

## 2019-10-17 MED ORDER — LORAZEPAM 2 MG/ML IJ SOLN
1.0000 mg | Freq: Once | INTRAMUSCULAR | Status: AC
  Administered 2019-10-17: 1 mg via INTRAVENOUS
  Filled 2019-10-17: qty 1

## 2019-10-17 MED ORDER — ONDANSETRON 4 MG PO TBDP
4.0000 mg | ORAL_TABLET | Freq: Three times a day (TID) | ORAL | 0 refills | Status: DC | PRN
Start: 2019-10-17 — End: 2020-02-06

## 2019-10-17 MED ORDER — LORAZEPAM 1 MG PO TABS
1.0000 mg | ORAL_TABLET | Freq: Two times a day (BID) | ORAL | 0 refills | Status: DC
Start: 2019-10-17 — End: 2020-02-06

## 2019-10-17 NOTE — Discharge Instructions (Signed)
You may have a UTI.  I will give you Keflex 1 pill 4 times a day.  This is an antibiotic and should help with that.  For the shakiness I will give you Ativan.  Take 2 pills 3 times a day for 2 days then take 1 pill 3 times a day for 2 days then 1 pill twice a day for 2 days then 1 pill once a day for 2 days then stop.  If you get very sleepy while you are taking this medication you can cut the number of pills in half for example instead of taking 2 pills 3 times a day you can take 1 pill 3 times a day.  If you are only taking 1 pill 3 times a day you can take a half a pill 3 times a day.  Please return for any further problems.  If you need something for nausea I will give you some Zofran melt on your tongue wafers to take as needed 3 times a day.

## 2019-10-17 NOTE — ED Triage Notes (Signed)
Pt presents to ED via POV with c/o weakness and decreased appetite x several days. Pt with noted severe weakness on arrival to ED. Pt also noted to be tachycardic on arrival with HR 135. Diaphoretic to touch.   Pt also c/o N/V x 3 days, states has been unable to keep anything down.

## 2019-10-17 NOTE — ED Notes (Signed)
Pt unable to sign for discharge d/t signature pad not working °

## 2019-10-17 NOTE — ED Provider Notes (Signed)
[facility] Emergency Department Provider Note   ____________________________________________   First MD Initiated Contact with Patient 10/17/19 [phone]     (approximate)  I have reviewed the triage vital signs and the nursing notes.   HISTORY  Chief Complaint Weakness   HPI Elijah Murphy is a 65 y.o. male patient reports nausea vomiting and diarrhea for 3 days.  He is feeling very weak.  He is also sweating.  He denies any abdominal pain.  He is a little bit short of breath he says and cannot breathe through the mask.  He is not having any chest pain either.   He is not been able to keep anything down today.     Past Medical History:  Diagnosis Date  . 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 Hypertension 08/10/2016  . Hyperlipidemia 08/10/2016    Past Surgical History:  Procedure Laterality Date  . APPENDECTOMY    . KNEE SURGERY Left   . TONSILLECTOMY      Prior to Admission medications   Medication Sig Start Date End Date Taking? Authorizing Provider  cephALEXin (KEFLEX) 500 MG capsule Take 1 capsule (500 mg total) by mouth 4 (four) times daily for 10 days. 10/17/19 10/27/19  Arnaldo Natal, MD  lisinopril (ZESTRIL) 20 MG tablet TAKE 1 TABLET BY MOUTH EVERY DAY 06/08/19   Flinchum, Eula Fried, FNP  LORazepam (ATIVAN) 1 MG tablet Take 1 tablet (1 mg total) by mouth 2 (two) times daily. Disregard the 1 tablet by mouth twice a day. Start with 2 tablets 3 times a day for 2 days and then  drop down to 1 tablet 3 times a day for 2 days and then Drop down to 1 tablet twice a day for 2 days and then Take 1 tablet a day for 2 days then stop.  If it anytime you get very sleepy you can cut the number of tablets taken at any 1 time in half.  To include going down to half a tablet the same number of times per day. 10/17/19 10/16/20  Arnaldo Natal, MD  Multiple Vitamin (MULTIVITAMIN) tablet Take 1  tablet by mouth daily.    [provider]  ondansetron (ZOFRAN ODT) 4 MG disintegrating tablet Take 1 tablet (4 mg total) by mouth every 8 (eight) hours as needed for nausea or vomiting. 10/17/19   Arnaldo Natal, MD    Allergies Patient has no known allergies.  History reviewed. No pertinent family history.  Social History Social History   Tobacco Use  . Smoking status: Never Smoker  . Smokeless tobacco: Current User    Types: Chew  Vaping Use  . Vaping Use: Never used  Substance Use Topics  . Alcohol use: Yes    Comment: occasionally  . Drug use: No    Review of Systems  Constitutional: No fever/chills but he is very sweaty Eyes: No visual changes. ENT: No sore throat. Cardiovascular: Denies chest pain. Respiratory: Some shortness of breath. Gastrointestinal: No abdominal pain.   nausea, vomiting.  diarrhea.  No constipation. Genitourinary: Negative for dysuria. Musculoskeletal: Negative for back pain. Skin: Negative for rash. Neurological: Negative for headaches, focal weakness   ____________________________________________   PHYSICAL EXAM:  VITAL SIGNS: ED Triage Vitals [10/17/19 0814]  Enc Vitals Group     BP (!) 109/49     Pulse Rate (!) 130     Resp (!)  24     Temp 97.9 F (36.6 C)     Temp Source Oral     SpO2 95 %     Weight 183 lb 3.2 oz (83.1 kg)     Height 5\' 9"  (1.753 m)     Head Circumference      Peak Flow      Pain Score 0     Pain Loc      Pain Edu?      Excl. in GC?     Constitutional: Alert and oriented.  Looks tired and ill he is very sweaty Eyes: Conjunctivae are normal. Head: Atraumatic. Nose: No congestion/rhinnorhea. Mouth/Throat: Mucous membranes are moist.  Oropharynx non-erythematous. Neck: No stridor. Cardiovascular: Normal rate, regular rhythm. Grossly normal heart sounds.  Good peripheral circulation. Respiratory: Normal respiratory effort.  No retractions. Lungs CTAB. Gastrointestinal: Soft and nontender. No  distention. No abdominal bruits.  Musculoskeletal: No lower extremity tenderness nor edema.   Neurologic:  Normal speech and language. No gross focal neurologic deficits are appreciated.  Skin:  Skin is warm, dry and intact. No rash noted.   ____________________________________________   LABS (all labs ordered are listed, but only abnormal results are displayed)  Labs Reviewed  COMPREHENSIVE METABOLIC PANEL - Abnormal; Notable for the following components:      Result Value   Sodium 132 (*)    Chloride 91 (*)    CO2 18 (*)    Glucose, Bld 220 (*)    Creatinine, Ser 1.57 (*)    AST 50 (*)    ALT 46 (*)    Total Bilirubin 4.3 (*)    GFR calc non Af Amer 46 (*)    GFR calc Af Amer 53 (*)    Anion gap 23 (*)    All other components within normal limits  URINALYSIS, COMPLETE (UACMP) WITH MICROSCOPIC - Abnormal; Notable for the following components:   Color, Urine AMBER (*)    APPearance CLOUDY (*)    Hgb urine dipstick SMALL (*)    Ketones, ur 20 (*)    Protein, ur 100 (*)    Leukocytes,Ua SMALL (*)    All other components within normal limits  CBC WITH DIFFERENTIAL/PLATELET - Abnormal; Notable for the following components:   Hemoglobin 18.5 (*)    MCH 35.5 (*)    Neutro Abs 8.2 (*)    All other components within normal limits  FIBRIN DERIVATIVES D-DIMER ([facility] ONLY) - Abnormal; Notable for the following components:   Fibrin derivatives D-dimer ([facility]) >7,500.00 (*)    All other components within normal limits  BASIC METABOLIC PANEL - Abnormal; Notable for the following components:   Glucose, Bld 152 (*)    Calcium 8.1 (*)    All other components within normal limits  TROPONIN I (HIGH SENSITIVITY) - Abnormal; Notable for the following components:   Troponin I (High Sensitivity) 34 (*)    All other components within normal limits  TROPONIN I (HIGH SENSITIVITY) - Abnormal; Notable for the following components:   Troponin I (High Sensitivity) 20 (*)    All other components  within normal limits  SARS CORONAVIRUS 2 BY RT PCR (HOSPITAL ORDER, PERFORMED IN [facility])  GASTROINTESTINAL PANEL BY PCR, STOOL (REPLACES STOOL CULTURE)  C DIFFICILE QUICK SCREEN W PCR REFLEX  LIPASE, BLOOD   ____________________________________________  EKG EKG read interpreted by me shows sinus tach rate of 134 normal axis new flipped T in lead III compared to old EKG from 2015 patient  does have an S1Q3T3 which can be seen with pulmonary embolus.  Patient's chest x-ray is clear.  I do not think he has a pulmonary embolus and in my experience S1Q3T3 is only been associated with a PE about 50% of the time I will get a D-dimer  ____________________________________________  RADIOLOGY  ED MD interpretation: Lungs appear clear but there does appear to be a shadow above the right diaphragm on the AP view. Chest x-ray read by radiology as negative as is the abdominal film.  I did review all the films. CT of the head and chest show no acute disease.  There is a nodule chest x-ray today. Official radiology report(s): DG Chest 2 View  Result Date: 10/17/2019 CLINICAL DATA:  Weakness and tachycardia EXAM: CHEST - 2 VIEW COMPARISON:  None. FINDINGS: The heart size and mediastinal contours are within normal limits. Both lungs are clear. The visualized skeletal structures are unremarkable. IMPRESSION: No active cardiopulmonary disease. Electronically Signed   By: Alcide Clever M.D.   On: 10/17/2019 09:14   CT Head Wo Contrast  Result Date: 10/17/2019 CLINICAL DATA:  Possible stroke. EXAM: CT HEAD WITHOUT CONTRAST TECHNIQUE: Contiguous axial images were obtained from the base of the skull through the vertex without intravenous contrast. COMPARISON:  MRI brain 05/22/2013 FINDINGS: Brain: Ventricles, cisterns and other CSF spaces are normal. There is evidence of mild to moderate chronic ischemic microvascular disease. No evidence of mass, mass effect, shift of midline structures or acute  hemorrhage. No evidence of acute infarction. Vascular: No hyperdense vessel or unexpected calcification. Skull: Normal. Negative for fracture or focal lesion. Sinuses/Orbits: No acute finding. Other: None. IMPRESSION: 1. No acute findings. 2. Chronic ischemic microvascular disease. Electronically Signed   By: Elberta Fortis M.D.   On: 10/17/2019 12:46   CT Angio Chest PE W and/or Wo Contrast  Result Date: 10/17/2019 CLINICAL DATA:  Patient with tachycardia and elevated D-dimer. EXAM: CT ANGIOGRAPHY CHEST WITH CONTRAST TECHNIQUE: Multidetector CT imaging of the chest was performed using the standard protocol during bolus administration of intravenous contrast. Multiplanar CT image reconstructions and MIPs were obtained to evaluate the vascular anatomy. CONTRAST:  84mL OMNIPAQUE IOHEXOL 350 MG/ML SOLN COMPARISON:  None. FINDINGS: Cardiovascular: Heart size upper limits of normal. Ascending thoracic aorta measures 4 cm. Thoracic aortic and coronary arterial vascular calcifications. Adequate opacification of the pulmonary arterial system. No intraluminal filling defects identified to suggest acute pulmonary embolus. Mediastinum/Nodes: No enlarged axillary, mediastinal. Circumferential wall thickening of the esophagus. Lungs/Pleura: Central airways are patent. Dependent atelectasis within the bilateral lower lobes. 4 mm left upper lobe nodule (image 20; series 6). Small right pleural effusion. No pneumothorax. Upper Abdomen: Liver is markedly low in attenuation compatible with steatosis. No acute process. Musculoskeletal: Thoracic spine degenerative changes. No aggressive or acute appearing osseous lesions. Review of the MIP images confirms the above findings. IMPRESSION: 1. No evidence for acute pulmonary embolus. 2. Small right pleural effusion. 3. Circumferential wall thickening of the esophagus which may be secondary to esophagitis. Consider evaluation with upper endoscopy as clinically warranted. 4. Hepatic  steatosis. 5. 4 mm left upper lobe nodule. No follow-up needed if patient is low-risk. Non-contrast chest CT can be considered in 12 months if patient is high-risk. This recommendation follows the consensus statement: Guidelines for Management of Incidental Pulmonary Nodules Detected on CT Images: From the Fleischner Society 2017; Radiology 2017; 284:228-243. 6. Ascending thoracic aorta measures 4 cm. Recommend annual imaging followup by CTA or MRA. This recommendation follows 2010 ACCF/AHA/AATS/ACR/ASA/SCA/SCAI/SIR/STS/SVM  Guidelines for the Diagnosis and Management of Patients with Thoracic Aortic Disease. Circulation. 2010; 121: Z610-R604: E266-e369. Aortic aneurysm NOS (ICD10-I71.9) 1 Aortic Atherosclerosis (ICD10-I70.0). Electronically Signed   By: Annia Beltrew  Davis M.D.   On: 10/17/2019 12:45   DG Abd 2 Views  Result Date: 10/17/2019 CLINICAL DATA:  Weakness EXAM: ABDOMEN - 2 VIEW COMPARISON:  None. FINDINGS: The bowel gas pattern is normal. There is no evidence of free air. No radio-opaque calculi or other significant radiographic abnormality is seen. IMPRESSION: No acute abnormality noted. Electronically Signed   By: Alcide CleverMark  Lukens M.D.   On: 10/17/2019 09:15    ____________________________________________   PROCEDURES  Procedure(s) performed (including Critical Care):  Procedures   ____________________________________________   INITIAL IMPRESSION / ASSESSMENT AND PLAN / ED COURSE ----------------------------------------- 11:13 AM on 10/17/2019 ----------------------------------------- Patient now feeling better his wife is here history changes little bit he has had the nausea and vomiting for 3 days really has minimal to keep anything down he was quite short of breath earlier today but is not now.  He did not have any pleuritic chest pain.  He is a little constipated and has not had diarrhea.  ----------------------------------------- 1:23 PM on  10/17/2019 -----------------------------------------  Patient now feeling basically back to normal.  He is a little shaky.  His wife tells me he drinks bourbon every day and has not been able to for 3 days so he may be having a little withdrawal.  We will give him some Ativan IV and see if this helps.  If it does we will try some p.o. fluids if he can keep them down anticipate we can let him leave.  He does not have any pain or discomfort anywhere.  There are some white cells and red cells in his urine but no bacteria.  We will double check with him and see if he has any dysuria.         ----------------------------------------- 2:11 PM on 10/17/2019 -----------------------------------------  Patient is feeling much better.  He is tolerating p.o.  He is a little shaky I will give him some Ativan taper to help with that.  His wife is going to make sure he is not drinking any.  I will also give him some Keflex for his apparent UTI.  And some Zofran ODT.         ____________________________________________   FINAL CLINICAL IMPRESSION(S) / ED DIAGNOSES  Final diagnoses:  Dehydration  Weakness  Non-intractable vomiting with nausea, unspecified vomiting type     ED Discharge Orders         Ordered    cephALEXin (KEFLEX) 500 MG capsule  4 times daily     Discontinue  Reprint     10/17/19 1358    LORazepam (ATIVAN) 1 MG tablet  2 times daily     Discontinue  Reprint     10/17/19 1358    ondansetron (ZOFRAN ODT) 4 MG disintegrating tablet  Every 8 hours PRN     Discontinue  Reprint     10/17/19 1400           Note:  This document was prepared using Dragon voice recognition software and may include unintentional dictation errors.    Arnaldo NatalMalinda, Zalmen Wrightsman F, MD 10/17/19 [phone]

## 2019-10-17 NOTE — ED Notes (Signed)
Pt to bathroom to urinate.  

## 2019-10-19 LAB — GLUCOSE, CAPILLARY: Glucose-Capillary: 230 mg/dL — ABNORMAL HIGH (ref 70–99)

## 2019-11-22 ENCOUNTER — Encounter: Payer: Self-pay | Admitting: Adult Health

## 2019-11-27 ENCOUNTER — Ambulatory Visit: Payer: BC Managed Care – PPO | Admitting: Adult Health

## 2019-12-01 NOTE — Telephone Encounter (Signed)
It looks like patient was on the schedule for 09/24 with Marcelino Duster and appointment got canceled by patient/or wife.

## 2019-12-08 NOTE — Telephone Encounter (Signed)
Called to leave a message for the patient to r/s appt and the voice mailbox was full.  MyChart message was sent on 9/29 for patient to call in for appt.

## 2020-02-05 ENCOUNTER — Emergency Department: Payer: BC Managed Care – PPO

## 2020-02-05 ENCOUNTER — Encounter: Payer: Self-pay | Admitting: Emergency Medicine

## 2020-02-05 ENCOUNTER — Inpatient Hospital Stay
Admission: EM | Admit: 2020-02-05 | Discharge: 2020-02-08 | DRG: 871 | Disposition: A | Discharge status: Home Health | Payer: BC Managed Care – PPO | Attending: Internal Medicine | Admitting: Internal Medicine

## 2020-02-05 ENCOUNTER — Other Ambulatory Visit: Payer: Self-pay

## 2020-02-05 DIAGNOSIS — R000 Tachycardia, unspecified: Secondary | ICD-10-CM | POA: Diagnosis present

## 2020-02-05 DIAGNOSIS — K529 Noninfective gastroenteritis and colitis, unspecified: Secondary | ICD-10-CM | POA: Diagnosis not present

## 2020-02-05 DIAGNOSIS — F17220 Nicotine dependence, chewing tobacco, uncomplicated: Secondary | ICD-10-CM | POA: Diagnosis present

## 2020-02-05 DIAGNOSIS — K5790 Diverticulosis of intestine, part unspecified, without perforation or abscess without bleeding: Secondary | ICD-10-CM | POA: Diagnosis present

## 2020-02-05 DIAGNOSIS — H538 Other visual disturbances: Secondary | ICD-10-CM | POA: Diagnosis present

## 2020-02-05 DIAGNOSIS — E860 Dehydration: Secondary | ICD-10-CM | POA: Diagnosis present

## 2020-02-05 DIAGNOSIS — A498 Other bacterial infections of unspecified site: Secondary | ICD-10-CM

## 2020-02-05 DIAGNOSIS — N179 Acute kidney failure, unspecified: Secondary | ICD-10-CM

## 2020-02-05 DIAGNOSIS — I10 Essential (primary) hypertension: Secondary | ICD-10-CM

## 2020-02-05 DIAGNOSIS — J90 Pleural effusion, not elsewhere classified: Secondary | ICD-10-CM | POA: Diagnosis present

## 2020-02-05 DIAGNOSIS — A419 Sepsis, unspecified organism: Secondary | ICD-10-CM | POA: Diagnosis present

## 2020-02-05 DIAGNOSIS — E876 Hypokalemia: Secondary | ICD-10-CM

## 2020-02-05 DIAGNOSIS — R296 Repeated falls: Secondary | ICD-10-CM | POA: Diagnosis present

## 2020-02-05 DIAGNOSIS — D649 Anemia, unspecified: Secondary | ICD-10-CM | POA: Diagnosis not present

## 2020-02-05 DIAGNOSIS — E872 Acidosis, unspecified: Secondary | ICD-10-CM

## 2020-02-05 DIAGNOSIS — A09 Infectious gastroenteritis and colitis, unspecified: Secondary | ICD-10-CM | POA: Diagnosis not present

## 2020-02-05 DIAGNOSIS — E785 Hyperlipidemia, unspecified: Secondary | ICD-10-CM | POA: Diagnosis present

## 2020-02-05 DIAGNOSIS — E871 Hypo-osmolality and hyponatremia: Secondary | ICD-10-CM | POA: Diagnosis present

## 2020-02-05 DIAGNOSIS — R531 Weakness: Secondary | ICD-10-CM

## 2020-02-05 DIAGNOSIS — Z20822 Contact with and (suspected) exposure to covid-19: Secondary | ICD-10-CM | POA: Diagnosis present

## 2020-02-05 DIAGNOSIS — Z87440 Personal history of urinary (tract) infections: Secondary | ICD-10-CM | POA: Diagnosis not present

## 2020-02-05 DIAGNOSIS — N390 Urinary tract infection, site not specified: Secondary | ICD-10-CM

## 2020-02-05 DIAGNOSIS — K760 Fatty (change of) liver, not elsewhere classified: Secondary | ICD-10-CM | POA: Diagnosis present

## 2020-02-05 DIAGNOSIS — D61818 Other pancytopenia: Secondary | ICD-10-CM | POA: Diagnosis present

## 2020-02-05 DIAGNOSIS — F32A Depression, unspecified: Secondary | ICD-10-CM | POA: Diagnosis present

## 2020-02-05 DIAGNOSIS — R6520 Severe sepsis without septic shock: Secondary | ICD-10-CM | POA: Diagnosis present

## 2020-02-05 DIAGNOSIS — I69398 Other sequelae of cerebral infarction: Secondary | ICD-10-CM | POA: Diagnosis not present

## 2020-02-05 DIAGNOSIS — E861 Hypovolemia: Secondary | ICD-10-CM | POA: Diagnosis present

## 2020-02-05 DIAGNOSIS — F419 Anxiety disorder, unspecified: Secondary | ICD-10-CM | POA: Diagnosis present

## 2020-02-05 DIAGNOSIS — R6510 Systemic inflammatory response syndrome (SIRS) of non-infectious origin without acute organ dysfunction: Secondary | ICD-10-CM | POA: Diagnosis not present

## 2020-02-05 DIAGNOSIS — W19XXXA Unspecified fall, initial encounter: Secondary | ICD-10-CM | POA: Diagnosis present

## 2020-02-05 DIAGNOSIS — K920 Hematemesis: Secondary | ICD-10-CM | POA: Diagnosis not present

## 2020-02-05 DIAGNOSIS — A044 Other intestinal Escherichia coli infections: Secondary | ICD-10-CM | POA: Diagnosis present

## 2020-02-05 DIAGNOSIS — A040 Enteropathogenic Escherichia coli infection: Secondary | ICD-10-CM | POA: Diagnosis present

## 2020-02-05 DIAGNOSIS — H5461 Unqualified visual loss, right eye, normal vision left eye: Secondary | ICD-10-CM | POA: Diagnosis present

## 2020-02-05 DIAGNOSIS — G9341 Metabolic encephalopathy: Secondary | ICD-10-CM

## 2020-02-05 DIAGNOSIS — N400 Enlarged prostate without lower urinary tract symptoms: Secondary | ICD-10-CM | POA: Diagnosis present

## 2020-02-05 DIAGNOSIS — K209 Esophagitis, unspecified without bleeding: Secondary | ICD-10-CM | POA: Diagnosis present

## 2020-02-05 DIAGNOSIS — R197 Diarrhea, unspecified: Secondary | ICD-10-CM | POA: Diagnosis not present

## 2020-02-05 LAB — BASIC METABOLIC PANEL
Anion gap: 30 — ABNORMAL HIGH (ref 5–15)
BUN: 13 mg/dL (ref 8–23)
CO2: 13 mmol/L — ABNORMAL LOW (ref 22–32)
Calcium: 8.8 mg/dL — ABNORMAL LOW (ref 8.9–10.3)
Chloride: 76 mmol/L — ABNORMAL LOW (ref 98–111)
Creatinine, Ser: 1.66 mg/dL — ABNORMAL HIGH (ref 0.61–1.24)
GFR, Estimated: 46 mL/min — ABNORMAL LOW (ref >60)
Glucose, Bld: 163 mg/dL — ABNORMAL HIGH (ref 70–99)
Potassium: 2.8 mmol/L — ABNORMAL LOW (ref 3.5–5.1)
Sodium: 119 mmol/L — CL (ref 135–145)

## 2020-02-05 LAB — CBC WITH DIFFERENTIAL/PLATELET
Abs Immature Granulocytes: 0.13 10*3/uL — ABNORMAL HIGH (ref 0.00–0.07)
Basophils Absolute: 0 10*3/uL (ref 0.0–0.1)
Basophils Relative: 0 %
Eosinophils Absolute: 0 10*3/uL (ref 0.0–0.5)
Eosinophils Relative: 0 %
Hemoglobin: 15.3 g/dL (ref 13.0–17.0)
Immature Granulocytes: 1 %
Lymphocytes Relative: 10 %
Lymphs Abs: 1.1 10*3/uL (ref 0.7–4.0)
Monocytes Absolute: 1.7 10*3/uL — ABNORMAL HIGH (ref 0.1–1.0)
Monocytes Relative: 16 %
Neutro Abs: 7.9 10*3/uL — ABNORMAL HIGH (ref 1.7–7.7)
Neutrophils Relative %: 73 %
Platelets: 191 10*3/uL (ref 150–400)
Smear Review: UNDETERMINED
WBC: 10.8 10*3/uL — ABNORMAL HIGH (ref 4.0–10.5)
nRBC: 0 % (ref 0.0–0.2)

## 2020-02-05 LAB — RESP PANEL BY RT-PCR (FLU A&B, COVID) ARPGX2
Influenza A by PCR: NEGATIVE
Influenza B by PCR: NEGATIVE
SARS Coronavirus 2 by RT PCR: NEGATIVE

## 2020-02-05 LAB — LACTIC ACID, PLASMA
Lactic Acid, Venous: 5.9 mmol/L (ref 0.5–1.9)
Lactic Acid, Venous: 2.7 mmol/L (ref 0.5–1.9)

## 2020-02-05 LAB — MAGNESIUM: Magnesium: 1.6 mg/dL — ABNORMAL LOW (ref 1.7–2.4)

## 2020-02-05 MED ORDER — VANCOMYCIN HCL IN DEXTROSE 1-5 GM/200ML-% IV SOLN
1000.0000 mg | Freq: Once | INTRAVENOUS | Status: AC
  Administered 2020-02-05: 1000 mg via INTRAVENOUS
  Filled 2020-02-05: qty 200

## 2020-02-05 MED ORDER — MAGNESIUM SULFATE 2 GM/50ML IV SOLN
2.0000 g | Freq: Once | INTRAVENOUS | Status: AC
  Administered 2020-02-05: 2 g via INTRAVENOUS
  Filled 2020-02-05: qty 50

## 2020-02-05 MED ORDER — ENOXAPARIN SODIUM 40 MG/0.4ML ~~LOC~~ SOLN: 40.0000 mg | SUBCUTANEOUS | Status: DC

## 2020-02-05 MED ORDER — METRONIDAZOLE IN NACL 5-0.79 MG/ML-% IV SOLN
500.0000 mg | Freq: Once | INTRAVENOUS | Status: AC
  Administered 2020-02-05: 500 mg via INTRAVENOUS
  Filled 2020-02-05: qty 100

## 2020-02-05 MED ORDER — ACETAMINOPHEN 325 MG PO TABS: 650.0000 mg | ORAL_TABLET | Freq: Four times a day (QID) | ORAL | Status: DC | PRN

## 2020-02-05 MED ORDER — VANCOMYCIN HCL IN DEXTROSE 1-5 GM/200ML-% IV SOLN: 1000.0000 mg | Freq: Once | INTRAVENOUS | Status: DC

## 2020-02-05 MED ORDER — SODIUM CHLORIDE 0.9 % IV BOLUS
1000.0000 mL | Freq: Once | INTRAVENOUS | Status: AC
  Administered 2020-02-05: 1000 mL via INTRAVENOUS

## 2020-02-05 MED ORDER — SODIUM CHLORIDE 0.9 % IV BOLUS (SEPSIS)
1000.0000 mL | Freq: Once | INTRAVENOUS | Status: AC
  Administered 2020-02-05: 1000 mL via INTRAVENOUS

## 2020-02-05 MED ORDER — LACTATED RINGERS IV SOLN: INTRAVENOUS | Status: DC

## 2020-02-05 MED ORDER — SODIUM CHLORIDE 0.9 % IV SOLN: 1.0000 g | Freq: Three times a day (TID) | INTRAVENOUS | Status: DC

## 2020-02-05 MED ORDER — ACETAMINOPHEN 650 MG RE SUPP: 650.0000 mg | Freq: Four times a day (QID) | RECTAL | Status: DC | PRN

## 2020-02-05 MED ORDER — SODIUM CHLORIDE 0.9 % IV SOLN
2.0000 g | Freq: Once | INTRAVENOUS | Status: AC
  Administered 2020-02-05: 2 g via INTRAVENOUS
  Filled 2020-02-05: qty 2

## 2020-02-05 MED ORDER — POTASSIUM CHLORIDE 10 MEQ/100ML IV SOLN
10.0000 meq | INTRAVENOUS | Status: AC
  Administered 2020-02-05 – 2020-02-06 (×4): 10 meq via INTRAVENOUS
  Filled 2020-02-05 (×4): qty 100

## 2020-02-05 MED ORDER — ONDANSETRON HCL 4 MG PO TABS: 4.0000 mg | ORAL_TABLET | Freq: Four times a day (QID) | ORAL | Status: DC | PRN

## 2020-02-05 MED ORDER — ONDANSETRON HCL 4 MG/2ML IJ SOLN: 4.0000 mg | Freq: Four times a day (QID) | INTRAMUSCULAR | Status: DC | PRN

## 2020-02-05 MED ORDER — SODIUM CHLORIDE 0.9 % IV BOLUS (SEPSIS)
500.0000 mL | Freq: Once | INTRAVENOUS | Status: AC
  Administered 2020-02-05: 500 mL via INTRAVENOUS

## 2020-02-05 NOTE — Progress Notes (Signed)
PHARMACY -  BRIEF ANTIBIOTIC NOTE   Pharmacy has received consult(s) for Vancomycin and Cefepime from an ED provider.  The patient's profile has been reviewed for ht/wt/allergies/indication/available labs.    One time order(s) placed for Cefepime 2g and Vancomycin 1g by ED provider.  Further antibiotics/pharmacy consults should be ordered by admitting physician if indicated.                       Thank you, Foye Deer 02/05/2020  8:45 PM

## 2020-02-05 NOTE — ED Notes (Signed)
Critical sodium of 119 called from lab. Elon Jester, charge rn notified.

## 2020-02-05 NOTE — ED Notes (Signed)
Pt noted to have BM. Pt cleansed and new linens applied. Pt also urinated at this time. Is amber in color.

## 2020-02-05 NOTE — ED Notes (Signed)
Patient transported to CT 

## 2020-02-05 NOTE — ED Triage Notes (Signed)
First Nurse NOte:  Arrives via [facility] for c/o syncope.  Initial BP 70/40.  Most recent BO 140/80.  500 cc NS given by EMS.  20 g R FA.

## 2020-02-05 NOTE — ED Notes (Signed)
Pt noted to have emesis episode, attending made aware and awaiting orders.

## 2020-02-05 NOTE — Progress Notes (Signed)
CODE SEPSIS - PHARMACY COMMUNICATION  **Broad Spectrum Antibiotics should be administered within 1 hour of Sepsis diagnosis**  Time Code Sepsis Called/Page Received: 20:38  Antibiotics Ordered: Cefepime, Metronidazole, Vancomycon  Time of 1st antibiotic administration: Cefepime given at 21:00  Additional action taken by pharmacy: n/a  If necessary, Name of Provider/Nurse Contacted: n/a    Foye Deer ,PharmD Clinical Pharmacist  02/05/2020  8:57 PM

## 2020-02-05 NOTE — ED Notes (Signed)
Dr. Derrill Kay notified regarding sodium of 119.

## 2020-02-05 NOTE — Sepsis Progress Note (Signed)
Following for sepsis monitoring ?

## 2020-02-05 NOTE — ED Triage Notes (Signed)
Pt to ED via [facility] from home for increased fall. Pt states that he has been losing his balance over the last few days. Pt states that he has been falling in the bathroom and when he gets out of bed. Pt has a skin tear on his right hand and right forearm. Pt denies any pain at this time. Pt is in NAD.

## 2020-02-05 NOTE — H&P (Addendum)
History and Physical    Elijah Murphy SPQ:[id number] DOB: 1955-08-14 DOA: 02/05/2020  PCP: Nira Retort   Patient coming from: Home  I have personally briefly reviewed patient's old medical records in [facility]  Chief Complaint: Fall  HPI: Elijah Murphy is a 64 y.o. 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  No affected contacts, no offending foods, no recent antibiotic use.  His weakness has led to frequent falls, reason for visit to the emergency room.  Wife states that this is how he presents when he has a urinary tract infection. Wife said she tried to encourage her husband to come earlier to the emergency room but he did not want to come.  He did have a cough but no shortness of breath.  No chest pain, fever chills or dysuria history limited as patient unable to contribute to history.  He is not vaccinated against Covid. ED Course: On arrival in the ED he was afebrile with temperature 97.8, BP 124/74 heart rate 107, becoming tachypneic in the ER, O2 sat 97% on room air.  Blood work showed WBC of 333[address] with lactic acid of 5.9.  Chemistries remarkable for sodium of 119, potassium 2.8, bicarb 13, creatinine 1.66 up from baseline of 1.0 and anion gap of 30.  Magnesium 1.6.  Repeat lactic after IV fluid bolus in the ER was 2.7.  Urinalysis pending.  Covid and flu negative. EKG as reviewed by me : Sinus tachycardia at 91 with no acute ST-T wave changes Chest x-ray ; no active disease CT head chronic right internal capsule lacunar infarct chronic white matter changes and no acute intracranial abnormalities Patient started on IV fluid bolus IV antibiotics for sepsis of unknown source.  Hospitalist consulted  for admission Review of Systems: As per HPI otherwise all other systems on review of systems negative.  Patient lethargic unable to contribute and wife helps with review of systems.   Past Medical History:  Diagnosis Date  . Hypertension     Past Surgical History:  Procedure Laterality Date  . APPENDECTOMY    . KNEE SURGERY Left   . TONSILLECTOMY       reports that he has never smoked. His smokeless tobacco use includes chew. He reports current alcohol use. He reports that he does not use drugs.  No Known Allergies  History reviewed. No pertinent family history.    Prior to Admission medications   Medication Sig Start Date End Date Taking? Authorizing Provider  LORazepam (ATIVAN) 1 MG tablet Take 1 tablet (1 mg total) by mouth 2 (two) times daily. Disregard the 1 tablet by mouth twice a day. Start with 2 tablets 3 times a day for 2 days and then  drop down to 1 tablet 3 times a day for 2 days and then Drop down to 1 tablet twice a day for 2 days and then Take 1 tablet a day for 2 days then stop.  If it anytime you get very sleepy you can cut the number of tablets taken at any 1 time in half.  To include going down to half a tablet the same number of times per day. 10/17/19 10/16/20  Arnaldo Natal, MD  ondansetron (ZOFRAN ODT) 4 MG disintegrating  tablet Take 1 tablet (4 mg total) by mouth every 8 (eight) hours as needed for nausea or vomiting. 10/17/19   Arnaldo Natal, MD    Physical Exam: Vitals:   02/05/20 1736 02/05/20 1859  BP: 124/74 (!) 155/98  Pulse: (!) 107 (!) 106  Resp: 16 16  Temp: 97.8 F (36.6 C)   TempSrc: Oral   SpO2: 97% 97%     Vitals:   02/05/20 1736 02/05/20 1859  BP: 124/74 (!) 155/98  Pulse: (!) 107 (!) 106  Resp: 16 16  Temp: 97.8 F (36.6 C)   TempSrc: Oral   SpO2: 97% 97%      Constitutional:  Lethargic and ill-appearing but oriented x 3 . Not in any apparent distress HEENT:      Head: Normocephalic and atraumatic.         Eyes:  PERLA, EOMI, Conjunctivae are normal. Sclera is non-icteric.       Mouth/Throat: Mucous membranes are moist.       Neck: Supple with no signs of meningismus. Cardiovascular:  Tachycardic. No murmurs, gallops, or rubs. 2+ symmetrical distal pulses are present . No JVD. No LE edema Respiratory: Respiratory effort normal .Lungs sounds clear bilaterally. No wheezes, crackles, or rhonchi.  Gastrointestinal: Soft, non tender, and non distended with positive bowel sounds. No rebound or guarding. Genitourinary: No CVA tenderness. Musculoskeletal: Nontender with normal range of motion in all extremities. No cyanosis, or erythema of extremities. Neurologic:  Face is symmetric. Moving all extremities. No gross focal neurologic deficits . Skin: Skin is warm, dry.  No rash or ulcers Psychiatric: Difficult to assess due to lethargy   Labs on Admission: I have personally reviewed following labs and imaging studies  CBC: Recent Labs  Lab 02/05/20 1905  WBC 10.8*  NEUTROABS 7.9*  HGB 15.3  HCT RESULTS UNAVAILABLE DUE TO INTERFERING SUBSTANCE  MCV RESULTS UNAVAILABLE DUE TO INTERFERING SUBSTANCE  PLT 191   Basic Metabolic Panel: Recent Labs  Lab 02/05/20 1905  NA 119*  K 2.8*  CL 76*  CO2 13*  GLUCOSE 163*  BUN 13  CREATININE 1.66*  CALCIUM 8.8*  MG 1.6*   GFR: CrCl cannot be calculated (Unknown ideal weight.). Liver Function Tests: No results for input(s): AST, ALT, ALKPHOS, BILITOT, PROT, ALBUMIN in the last 168 hours. No results for input(s): LIPASE, AMYLASE in the last 168 hours. No results for input(s): AMMONIA in the last 168 hours. Coagulation Profile: No results for input(s): INR, PROTIME in the last 168 hours. Cardiac Enzymes: No results for input(s): CKTOTAL, CKMB, CKMBINDEX, TROPONINI in the last 168 hours. BNP (last 3 results) No results for input(s): PROBNP in the last 8760 hours. HbA1C: No results for input(s): HGBA1C in the last 72 hours. CBG: No results for  input(s): GLUCAP in the last 168 hours. Lipid Profile: No results for input(s): CHOL, HDL, LDLCALC, TRIG, CHOLHDL, LDLDIRECT in the last 72 hours. Thyroid Function Tests: No results for input(s): TSH, T4TOTAL, FREET4, T3FREE, THYROIDAB in the last 72 hours. Anemia Panel: No results for input(s): VITAMINB12, FOLATE, FERRITIN, TIBC, IRON, RETICCTPCT in the last 72 hours. Urine analysis:    Component Value Date/Time   COLORURINE AMBER (A) 10/17/2019 1117   APPEARANCEUR CLOUDY (A) 10/17/2019 1117   LABSPEC 1.016 10/17/2019 1117   PHURINE 6.0 10/17/2019 1117   GLUCOSEU NEGATIVE 10/17/2019 1117   HGBUR SMALL (A) 10/17/2019 1117   BILIRUBINUR NEGATIVE 10/17/2019 1117   KETONESUR 20 (A) 10/17/2019 1117   PROTEINUR 100 (A) 10/17/2019  1117   NITRITE NEGATIVE 10/17/2019 1117   LEUKOCYTESUR SMALL (A) 10/17/2019 1117    Radiological Exams on Admission: CT Head Wo Contrast  Result Date: 02/05/2020 CLINICAL DATA:  Fall.  Losing balance over the last few days. EXAM: CT HEAD WITHOUT CONTRAST TECHNIQUE: Contiguous axial images were obtained from the base of the skull through the vertex without intravenous contrast. COMPARISON:  None. FINDINGS: Brain: No subdural, epidural, or subarachnoid hemorrhage. Ventricles and sulci are stable. A lacunar infarct in the right internal capsule is more prominent interval remains small. Scattered white matter changes are otherwise unchanged. No acute cortical ischemia or infarct. No mass effect or midline shift. Cerebellum, brainstem, and basal cisterns are normal. Vascular: Calcified atherosclerosis in the intracranial carotids. Skull: Normal. Negative for fracture or focal lesion. Sinuses/Orbits: No acute finding. Other: None. IMPRESSION: Chronic white matter changes. Chronic right internal capsule lacunar infarct, more prominent the interval. No acute intracranial abnormalities are noted. Electronically Signed   By: Gerome Sam III M.D   On: 02/05/2020 20:34   DG  Chest Portable 1 View  Result Date: 02/05/2020 CLINICAL DATA:  Confusion EXAM: PORTABLE CHEST 1 VIEW COMPARISON:  10/17/2019 FINDINGS: The heart size and mediastinal contours are within normal limits. Both lungs are clear. The visualized skeletal structures are unremarkable. IMPRESSION: No active disease. Electronically Signed   By: Deatra Robinson M.D.   On: 02/05/2020 22:37     Assessment/Plan 64 year old male with history of HTN, HLD, anxiety and depression and past history of UTIs who presents to the emergency room with a 2-week history of progressive weakness, decreased oral intake, confusion and vomiting and diarrhea and frequent falls     Severe sepsis (HCC) -Patient with above symptoms, tachycardic, afebrile but with leukocytosis, lactic acid of 5.9>>2.7, AKI, acute encephalopathy, with history of UTIs, now with symptoms of gastroenteritis -Completed sepsis fluid bolus -Continue aggressive IV resuscitation -IV meropenem and vancomycin to cover sepsis of unknown source -Covid and flu negative.  Chest x-ray clear -Follow urinalysis, which is suspected source  Acute gastroenteritis -Patient presents with vomiting and diarrhea, decreased oral intake but without abdominal pain or fever -Follow-up hepatic function panel, lipase, urinalysis -Follow stool studies -May consider CT abdomen and pelvis -IV hydration as above, IV antiemetics  Coffee-ground emesis -Following admission, patient had a coffee-ground emesis in the ER -Gastroccult.  Hemoglobin at 15 but possibly hemoconcentrated from dehydration -Keep n.p.o. -Protonix IV -Consult GI -Serial H&H -SCDs for DVT prophylaxis    AKI (acute kidney injury) (HCC)   Increased anion gap metabolic acidosis -Creatinine of 1.66, up from most recent baseline of 1.0, likely prerenal related to vomiting and renal hypoperfusion from sepsis -Bicarb of 13 and anion gap of 30, likely related to lactic acidosis -Treat sepsis above -Monitor renal  function, renally adjust meds avoid nephrotoxins    Hyponatremia -Critically low sodium of 119, likely related to the aforementioned problems, likely hypovolemic -Patient was bolused with normal saline -Monitor serum sodium -Follow urine and sodium osmolalities and urine sodium -Aim for daily correction of 8 mEq -Close monitoring of mental status  Acute metabolic encephalopathy -Related to GI losses, sepsis as well as hyponatremia -Neurologic checks -Fall and aspiration precautions    Hypomagnesemia -IV supplementation given in the emergency room.  Continue to monitor    Hypertension -Holding home antihypertensives given severe sepsis and possibility of developing pain and hypotension -Continue to monitor closely    Hyperlipidemia -Continue home meds    Hypokalemia -IV repletion and monitor  Right  eye visual loss secondary to stroke -Increase nursing assistance as needed -Hold off on aspirin given possible GI bleed    DVT prophylaxis: SCD Code Status: full code  Family Communication:  none  Disposition Plan: Back to previous home environment Consults called: none  Status:At the time of admission, it appears that the appropriate admission status for this patient is INPATIENT. This is judged to be reasonable and necessary in order to provide the required intensity of service to ensure the patient's safety given the presenting symptoms, physical exam findings, and initial radiographic and laboratory data in the context of their  Comorbid conditions.   Patient requires inpatient status due to high intensity of service, high risk for further deterioration and high frequency of surveillance required.   I certify that at the point of admission it is my clinical judgment that the patient will require inpatient hospital care spanning beyond 2 midnights     Andris BaumannHazel V Virgene Tirone MD [facility]     02/05/2020, 10:43 PM

## 2020-02-05 NOTE — ED Notes (Signed)
Date and time results received: 02/05/20 2029   Test: Lactic Acid  Critical Value: 5.9  Name of Provider Notified: Derrill Kay, MD   Orders Received? Or Actions Taken?: Actions Taken: ED provider aware.

## 2020-02-05 NOTE — ED Provider Notes (Signed)
[facility] Emergency Department Provider Note   ____________________________________________   I have reviewed the triage vital signs and the nursing notes.   HISTORY  Chief Complaint Fall   History limited by and level 5 caveat due to confusion: History primarily obtained from family   HPI Elijah Murphy is a 64 y.o. male who presents to the emergency department today with concerns for weakness and a fall.  Family states the patient has not been doing well for the past couple of weeks.  I have noticed over the past couple of days that his appetite has decreased significantly.  This has been accompanied by some vomiting.  Today the patient became so weak that he fell to the ground.  Family states that he has a history of UTI which caused somewhat similar symptoms in the past.  They are unaware of any fevers.  Patient not been complaining of any abdominal pain.   Records reviewed. Per medical record review patient has a history of HTN  Past Medical History:  Diagnosis Date  . Hypertension     Patient Active Problem List   Diagnosis Date Noted  . Hypertension 08/10/2016  . Hyperlipidemia 08/10/2016    Past Surgical History:  Procedure Laterality Date  . APPENDECTOMY    . KNEE SURGERY Left   . TONSILLECTOMY      Prior to Admission medications   Medication Sig Start Date End Date Taking? Authorizing Provider  LORazepam (ATIVAN) 1 MG tablet Take 1 tablet (1 mg total) by mouth 2 (two) times daily. Disregard the 1 tablet by mouth twice a day. Start with 2 tablets 3 times a day for 2 days and then  drop down to 1 tablet 3 times a day for 2 days and then Drop down to 1 tablet twice a day for 2 days and then Take 1 tablet a day for 2 days then stop.  If it anytime you get very sleepy you can cut the number of tablets taken at any 1 time in half.  To include going down to half a tablet the same number of times per day. 10/17/19 10/16/20  Arnaldo Natal, MD   ondansetron (ZOFRAN ODT) 4 MG disintegrating tablet Take 1 tablet (4 mg total) by mouth every 8 (eight) hours as needed for nausea or vomiting. 10/17/19   Arnaldo Natal, MD    Allergies Patient has no known allergies.  No family history on file.  Social History Social History   Tobacco Use  . Smoking status: Never Smoker  . Smokeless tobacco: Current User    Types: Chew  Vaping Use  . Vaping Use: Never used  Substance Use Topics  . Alcohol use: Yes    Comment: occasionally  . Drug use: No    Review of Systems Unable to obtain reliable ROS secondary to confusion. ____________________________________________   PHYSICAL EXAM:  VITAL SIGNS: ED Triage Vitals  Enc Vitals Group     BP 02/05/20 1736 124/74     Pulse Rate 02/05/20 1736 (!) 107     Resp 02/05/20 1736 16     Temp 02/05/20 1736 97.8 F (36.6 C)     Temp Source 02/05/20 1736 Oral     SpO2 02/05/20 1736 97 %     Weight --      Height --      Head Circumference --      Peak Flow --      Pain Score 02/05/20 1740 0  Constitutional: Awake and alert.  Eyes: Conjunctivae are normal.  ENT      Head: Normocephalic and atraumatic.      Nose: No congestion/rhinnorhea.      Mouth/Throat: Mucous membranes are moist.      Neck: No stridor. Hematological/Lymphatic/Immunilogical: No cervical lymphadenopathy. Cardiovascular: Normal rate, regular rhythm.  No murmurs, rubs, or gallops.  Respiratory: Normal respiratory effort without tachypnea nor retractions. Breath sounds are clear and equal bilaterally. No wheezes/rales/rhonchi. Gastrointestinal: Soft and non tender. No rebound. No guarding.  Genitourinary: Deferred Musculoskeletal: Normal range of motion in all extremities. No lower extremity edema. Neurologic: Awake and alert. Not completely oriented.  Skin:  Skin is warm, dry and intact. No rash noted. ____________________________________________    LABS (pertinent positives/negatives)  CBC wbc 10.8, hgb  15.4, plt 191 BMP na 119, k 2.8, glu 163, cr 1.66  ____________________________________________   EKG  I, Phineas Semen, attending physician, personally viewed and interpreted this EKG  EKG Time: 2001 Rate: 91 Rhythm: sinus rhythm Axis: normal Intervals: qtc 518 QRS: narrow, q waves v1 ST changes: no st elevation Impression: abnormal ekg  ____________________________________________    RADIOLOGY  CT head No acute abnormalities  ____________________________________________   PROCEDURES  Procedures  CRITICAL CARE Performed by: Phineas Semen   Total critical care time: 35 minutes  Critical care time was exclusive of separately billable procedures and treating other patients.  Critical care was necessary to treat or prevent imminent or life-threatening deterioration.  Critical care was time spent personally by me on the following activities: development of treatment plan with patient and/or surrogate as well as nursing, discussions with consultants, evaluation of patient's response to treatment, examination of patient, obtaining history from patient or surrogate, ordering and performing treatments and interventions, ordering and review of laboratory studies, ordering and review of radiographic studies, pulse oximetry and re-evaluation of patient's condition.  ____________________________________________   INITIAL IMPRESSION / ASSESSMENT AND PLAN / ED COURSE  Pertinent labs & imaging results that were available during my care of the patient were reviewed by me and considered in my medical decision making (see chart for details).   Patient presented to the emergency department today because of concerns for weakness and fall.  Exam patient is somewhat disoriented.  Patient's blood work is concerning for significant lactic acidosis.  Is also found to be significantly hypo-Trimix.  Did have concerns for infection given lactic acidosis and clinical history.  Patient  will be given broad-spectrum antibiotics.  Additionally hyponatremia could contribute to confusion and weakness as well.  Because of this his fluid bolus was given his normal saline rather than lactated Ringer's given higher milliequivalents of sodium.  Discussed findings with patient and family.  Will admit to the hospital service.  ___________________________________________   FINAL CLINICAL IMPRESSION(S) / ED DIAGNOSES  Final diagnoses:  Weakness  Hyponatremia  Lactic acidosis     Note: This dictation was prepared with Dragon dictation. Any transcriptional errors that result from this process are unintentional     Phineas Semen, MD 02/05/20 [phone]

## 2020-02-06 ENCOUNTER — Inpatient Hospital Stay: Payer: BC Managed Care – PPO

## 2020-02-06 ENCOUNTER — Encounter: Payer: Self-pay | Admitting: Internal Medicine

## 2020-02-06 DIAGNOSIS — R6510 Systemic inflammatory response syndrome (SIRS) of non-infectious origin without acute organ dysfunction: Secondary | ICD-10-CM | POA: Insufficient documentation

## 2020-02-06 DIAGNOSIS — I10 Essential (primary) hypertension: Secondary | ICD-10-CM

## 2020-02-06 DIAGNOSIS — R197 Diarrhea, unspecified: Secondary | ICD-10-CM

## 2020-02-06 DIAGNOSIS — E872 Acidosis: Secondary | ICD-10-CM

## 2020-02-06 DIAGNOSIS — E876 Hypokalemia: Secondary | ICD-10-CM

## 2020-02-06 DIAGNOSIS — K920 Hematemesis: Secondary | ICD-10-CM

## 2020-02-06 DIAGNOSIS — K529 Noninfective gastroenteritis and colitis, unspecified: Secondary | ICD-10-CM | POA: Insufficient documentation

## 2020-02-06 DIAGNOSIS — E871 Hypo-osmolality and hyponatremia: Secondary | ICD-10-CM

## 2020-02-06 DIAGNOSIS — D649 Anemia, unspecified: Secondary | ICD-10-CM | POA: Insufficient documentation

## 2020-02-06 DIAGNOSIS — G9341 Metabolic encephalopathy: Secondary | ICD-10-CM

## 2020-02-06 LAB — CORTISOL-AM, BLOOD: Cortisol - AM: 48.8 ug/dL — ABNORMAL HIGH (ref 6.7–22.6)

## 2020-02-06 LAB — URINALYSIS, COMPLETE (UACMP) WITH MICROSCOPIC
Bilirubin Urine: NEGATIVE
Glucose, UA: NEGATIVE mg/dL
Ketones, ur: 20 mg/dL — AB
Leukocytes,Ua: NEGATIVE
Nitrite: NEGATIVE
Protein, ur: 100 mg/dL — AB
Specific Gravity, Urine: 1.014 (ref 1.005–1.030)
pH: 6 (ref 5.0–8.0)

## 2020-02-06 LAB — HEPATIC FUNCTION PANEL
ALT: 42 U/L (ref 0–44)
AST: 70 U/L — ABNORMAL HIGH (ref 15–41)
Albumin: 3.1 g/dL — ABNORMAL LOW (ref 3.5–5.0)
Alkaline Phosphatase: 46 U/L (ref 38–126)
Bilirubin, Direct: 1.1 mg/dL — ABNORMAL HIGH (ref 0.0–0.2)
Indirect Bilirubin: 2.4 mg/dL — ABNORMAL HIGH (ref 0.3–0.9)
Total Bilirubin: 3.5 mg/dL — ABNORMAL HIGH (ref 0.3–1.2)
Total Protein: 5 g/dL — ABNORMAL LOW (ref 6.5–8.1)

## 2020-02-06 LAB — BASIC METABOLIC PANEL
Anion gap: 13 (ref 5–15)
BUN: 16 mg/dL (ref 8–23)
CO2: 20 mmol/L — ABNORMAL LOW (ref 22–32)
Calcium: 7.7 mg/dL — ABNORMAL LOW (ref 8.9–10.3)
Chloride: 93 mmol/L — ABNORMAL LOW (ref 98–111)
Creatinine, Ser: 0.87 mg/dL (ref 0.61–1.24)
GFR, Estimated: >60 mL/min (ref >60)
Glucose, Bld: 110 mg/dL — ABNORMAL HIGH (ref 70–99)
Potassium: 2.4 mmol/L — CL (ref 3.5–5.1)
Sodium: 126 mmol/L — ABNORMAL LOW (ref 135–145)

## 2020-02-06 LAB — CBC
Hemoglobin: 11.5 g/dL — ABNORMAL LOW (ref 13.0–17.0)
Platelets: UNDETERMINED 10*3/uL (ref 150–400)
WBC: 3.6 10*3/uL — ABNORMAL LOW (ref 4.0–10.5)
nRBC: 0 % (ref 0.0–0.2)

## 2020-02-06 LAB — COMPREHENSIVE METABOLIC PANEL
ALT: 43 U/L (ref 0–44)
AST: 74 U/L — ABNORMAL HIGH (ref 15–41)
Albumin: 3.2 g/dL — ABNORMAL LOW (ref 3.5–5.0)
Alkaline Phosphatase: 47 U/L (ref 38–126)
Anion gap: 17 — ABNORMAL HIGH (ref 5–15)
BUN: 17 mg/dL (ref 8–23)
CO2: 17 mmol/L — ABNORMAL LOW (ref 22–32)
Calcium: 7.3 mg/dL — ABNORMAL LOW (ref 8.9–10.3)
Chloride: 87 mmol/L — ABNORMAL LOW (ref 98–111)
Creatinine, Ser: 1.17 mg/dL (ref 0.61–1.24)
GFR, Estimated: >60 mL/min (ref >60)
Glucose, Bld: 178 mg/dL — ABNORMAL HIGH (ref 70–99)
Potassium: 2.9 mmol/L — ABNORMAL LOW (ref 3.5–5.1)
Sodium: 121 mmol/L — ABNORMAL LOW (ref 135–145)
Total Bilirubin: 3.7 mg/dL — ABNORMAL HIGH (ref 0.3–1.2)
Total Protein: 5.2 g/dL — ABNORMAL LOW (ref 6.5–8.1)

## 2020-02-06 LAB — PROTIME-INR
INR: 1.2 (ref 0.8–1.2)
Prothrombin Time: 14.5 seconds (ref 11.4–15.2)

## 2020-02-06 LAB — SODIUM, URINE, RANDOM: Sodium, Ur: 69 mmol/L

## 2020-02-06 LAB — LIPASE, BLOOD: Lipase: 45 U/L (ref 11–51)

## 2020-02-06 LAB — OSMOLALITY: Osmolality: 257 mOsm/kg — ABNORMAL LOW (ref 275–295)

## 2020-02-06 LAB — PROCALCITONIN: Procalcitonin: 2.6 ng/mL

## 2020-02-06 LAB — OSMOLALITY, URINE: Osmolality, Ur: 475 mOsm/kg (ref 300–900)

## 2020-02-06 LAB — HIV ANTIBODY (ROUTINE TESTING W REFLEX): HIV Screen 4th Generation wRfx: NONREACTIVE

## 2020-02-06 MED ORDER — PANTOPRAZOLE SODIUM 40 MG IV SOLR
40.0000 mg | Freq: Two times a day (BID) | INTRAVENOUS | Status: DC
  Administered 2020-02-06 – 2020-02-07 (×4): 40 mg via INTRAVENOUS
  Filled 2020-02-06 (×4): qty 40

## 2020-02-06 MED ORDER — LISINOPRIL 10 MG PO TABS
10.0000 mg | ORAL_TABLET | Freq: Every day | ORAL | Status: DC
  Administered 2020-02-06 – 2020-02-08 (×3): 10 mg via ORAL
  Filled 2020-02-06 (×3): qty 1

## 2020-02-06 MED ORDER — SODIUM CHLORIDE 0.9 % IV SOLN
1.0000 g | Freq: Two times a day (BID) | INTRAVENOUS | Status: DC
  Administered 2020-02-06 (×2): 1 g via INTRAVENOUS
  Filled 2020-02-06 (×2): qty 1

## 2020-02-06 MED ORDER — POTASSIUM CHLORIDE CRYS ER 20 MEQ PO TBCR
40.0000 meq | EXTENDED_RELEASE_TABLET | Freq: Three times a day (TID) | ORAL | Status: DC
  Administered 2020-02-06: 40 meq via ORAL
  Filled 2020-02-06: qty 2

## 2020-02-06 MED ORDER — VANCOMYCIN HCL 750 MG/150ML IV SOLN
750.0000 mg | Freq: Two times a day (BID) | INTRAVENOUS | Status: DC
  Administered 2020-02-06 – 2020-02-07 (×2): 750 mg via INTRAVENOUS
  Filled 2020-02-06 (×4): qty 150

## 2020-02-06 MED ORDER — SODIUM CHLORIDE 0.9 % IV SOLN
1.0000 g | Freq: Three times a day (TID) | INTRAVENOUS | Status: DC
  Administered 2020-02-06 – 2020-02-07 (×2): 1 g via INTRAVENOUS
  Filled 2020-02-06 (×4): qty 1

## 2020-02-06 MED ORDER — POTASSIUM CHLORIDE IN NACL 20-0.9 MEQ/L-% IV SOLN
INTRAVENOUS | Status: DC
  Filled 2020-02-06 (×5): qty 1000

## 2020-02-06 MED ORDER — POTASSIUM CHLORIDE 20 MEQ PO PACK
40.0000 meq | PACK | Freq: Three times a day (TID) | ORAL | Status: DC
  Administered 2020-02-06 – 2020-02-07 (×3): 40 meq via ORAL
  Filled 2020-02-06 (×3): qty 2

## 2020-02-06 MED ORDER — VANCOMYCIN HCL IN DEXTROSE 1-5 GM/200ML-% IV SOLN
1000.0000 mg | Freq: Once | INTRAVENOUS | Status: AC
  Administered 2020-02-06: 1000 mg via INTRAVENOUS
  Filled 2020-02-06: qty 200

## 2020-02-06 NOTE — ED Notes (Signed)
Lab advising this RN redraw lavender needed, this RN requesting lab to come to bedside to obtain repeat sample due to difficult vasculature.

## 2020-02-06 NOTE — ED Notes (Signed)
Date and time results received: 02/06/20    Test: K+ Critical Value: 2.4  Name of Provider Notified: Wieting  Orders Received? Or Actions Taken?: Kdur order TID

## 2020-02-06 NOTE — Consult Note (Signed)
Elijah Minium, MD [facility]  [address], [address] [place], [place] [address] Phone: [phone] Fax : [phone]  Consultation  Referring Provider:     Dr. Lindajo Royal Primary Care Physician:  Nira Retort Primary Gastroenterologist:  Gentry Fitz         Reason for Consultation:     Coffee ground emesis  Date of Admission:  02/05/2020 Date of Consultation:  02/06/2020         HPI:   Elijah Murphy is a 64 y.o. male who is seen today with Dr. Renae Gloss for coffee ground emesis. The patient denies ever seeing a gastrologist in the past.He does report that he has been having progressive weakness and diarrhea the last few weeks.  He denies any bloody stools but does state that his stools have been dark.The patient also came in with hyponatremia and was reported to have coffee ground emesis.  The patient tells me today that he hasn't vomited any blood but when he vomited is clear. The patient was reported to have had a fall prior to admission and his appetite had been decreasing.  The patient denies any abdominal pain associated with his diarrhea nausea or vomiting. On admission the patient's labs showed a elevated total bilirubin with the total bilirubin being 3.5 but the majority of the bilirubin was indirect. His white cell count on admission was 10.8 and this morning was 3.6.  His hemoglobin yesterday was 15.3 and this morning was 11.5 The patient had a CT scan of the abdomen that showed thickening of the distal esophagus consistent with esophagitis and cervical rectal wall thickening of the urinary bladder with fatty liver and pleural effusions.  There was some diverticulosis without any diverticulitis and there was an enlarged prostate noted.  Past Medical History:  Diagnosis Date  . Hypertension     Past Surgical History:  Procedure Laterality Date  . APPENDECTOMY    . KNEE SURGERY Left   . TONSILLECTOMY      Prior to Admission medications   Medication Sig Start Date End  Date Taking? Authorizing Provider  lisinopril (ZESTRIL) 10 MG tablet Take 10 mg by mouth daily.  06/08/19  Yes [provider]    History reviewed. No pertinent family history.   Social History   Tobacco Use  . Smoking status: Never Smoker  . Smokeless tobacco: Current User    Types: Chew  Vaping Use  . Vaping Use: Never used  Substance Use Topics  . Alcohol use: Yes    Comment: occasionally  . Drug use: No    Allergies as of 02/05/2020  . (No Known Allergies)    Review of Systems:    All systems reviewed and negative except where noted in HPI.   Physical Exam:  Vital signs in last 24 hours: Temp:  [97.7 F (36.5 C)-97.8 F (36.6 C)] 97.7 F (36.5 C) (12/04 0527) Pulse Rate:  [72-128] 107 (12/04 1315) Resp:  [12-30] 14 (12/04 1315) BP: (102-173)/(74-121) 173/101 (12/04 1300) SpO2:  [95 %-100 %] 97 % (12/04 1315) Weight:  [83 kg] 83 kg (12/03 2350)   General:   Pleasant, cooperative in NAD Head:  Normocephalic and atraumatic. Eyes:   No icterus.   Conjunctiva pink. PERRLA. Ears:  Normal auditory acuity. Neck:  Supple; no masses or thyroidomegaly Lungs: Respirations even and unlabored. Lungs clear to auscultation bilaterally.   No wheezes, crackles, or rhonchi.  Heart:  Regular rate and rhythm;  Without murmur, clicks, rubs or gallops Abdomen:  Soft, nondistended, nontender. Normal bowel sounds. No appreciable masses or hepatomegaly.  No rebound or guarding.  Rectal:  Not performed. Msk:  Symmetrical without gross deformities.    Extremities:  Without edema, cyanosis or clubbing. Neurologic:  Alert and oriented x3;  grossly normal neurologically. Skin:  Intact without significant lesions or rashes. Cervical Nodes:  No significant cervical adenopathy. Psych:  Alert and cooperative. Normal affect.  LAB RESULTS: Recent Labs    02/05/20 1905 02/06/20 0336  WBC 10.8* 3.6*  HGB 15.3 11.5*  HCT RESULTS UNAVAILABLE DUE TO INTERFERING SUBSTANCE RESULTS  UNAVAILABLE DUE TO INTERFERING SUBSTANCE  PLT 191 PLATELET CLUMPS NOTED ON SMEAR, UNABLE TO ESTIMATE   BMET Recent Labs    02/05/20 1905 02/06/20 0336  NA 119* 121*  K 2.8* 2.9*  CL 76* 87*  CO2 13* 17*  GLUCOSE 163* 178*  BUN 13 17  CREATININE 1.66* 1.17  CALCIUM 8.8* 7.3*   LFT Recent Labs    02/06/20 0336  PROT 5.0*  5.2*  ALBUMIN 3.1*  3.2*  AST 70*  74*  ALT 42  43  ALKPHOS 46  47  BILITOT 3.5*  3.7*  BILIDIR 1.1*  IBILI 2.4*   PT/INR Recent Labs    02/06/20 0336  LABPROT 14.5  INR 1.2    STUDIES: CT ABDOMEN PELVIS WO CONTRAST  Result Date: 02/06/2020 CLINICAL DATA:  Renal failure.  Hematemesis EXAM: CT ABDOMEN AND PELVIS WITHOUT CONTRAST TECHNIQUE: Multidetector CT imaging of the abdomen and pelvis was performed following the standard protocol without IV contrast. COMPARISON:  None. FINDINGS: Lower chest: There are trace bilateral pleural effusions with adjacent mild atelectasis.The heart size is essentially normal. Hepatobiliary: There is severe hepatic steatosis. Normal gallbladder.There is no biliary ductal dilation. Pancreas: Normal contours without ductal dilatation. No peripancreatic fluid collection. Spleen: Unremarkable. Adrenals/Urinary Tract: --Adrenal glands: Unremarkable. --Right kidney/ureter: No hydronephrosis or radiopaque kidney stones. --Left kidney/ureter: No hydronephrosis or radiopaque kidney stones. --Urinary bladder: There is diffuse circumferential wall thickening of the urinary bladder. Stomach/Bowel: --Stomach/Duodenum: There appears to be esophageal wall thickening involving the partially visualized distal esophagus. --Small bowel: Unremarkable. --Colon: There are scattered colonic diverticula without CT evidence for diverticulitis. --Appendix: Normal. Vascular/Lymphatic: Atherosclerotic calcification is present within the non-aneurysmal abdominal aorta, without hemodynamically significant stenosis. --No retroperitoneal lymphadenopathy.  --No mesenteric lymphadenopathy. --No pelvic or inguinal lymphadenopathy. Reproductive: The prostate gland is enlarged. Other: No ascites or free air. The abdominal wall is normal. Musculoskeletal. No acute displaced fractures. IMPRESSION: 1. Probable wall thickening of the distal esophagus which can be seen in patients with esophagitis. 2. Circumferential wall thickening of the urinary bladder. Correlate with urinalysis to exclude cystitis. 3. Severe hepatic steatosis. 4. Trace bilateral pleural effusions with adjacent mild atelectasis. 5. Colonic diverticulosis without CT evidence for diverticulitis. 6. Enlarged prostate gland. Aortic Atherosclerosis (ICD10-I70.0). Electronically Signed   By: Katherine Mantle M.D.   On: 02/06/2020 00:44   CT Head Wo Contrast  Result Date: 02/05/2020 CLINICAL DATA:  Fall.  Losing balance over the last few days. EXAM: CT HEAD WITHOUT CONTRAST TECHNIQUE: Contiguous axial images were obtained from the base of the skull through the vertex without intravenous contrast. COMPARISON:  None. FINDINGS: Brain: No subdural, epidural, or subarachnoid hemorrhage. Ventricles and sulci are stable. A lacunar infarct in the right internal capsule is more prominent interval remains small. Scattered white matter changes are otherwise unchanged. No acute cortical ischemia or infarct. No mass effect or midline shift. Cerebellum, brainstem, and basal cisterns are normal. Vascular: Calcified  atherosclerosis in the intracranial carotids. Skull: Normal. Negative for fracture or focal lesion. Sinuses/Orbits: No acute finding. Other: None. IMPRESSION: Chronic white matter changes. Chronic right internal capsule lacunar infarct, more prominent the interval. No acute intracranial abnormalities are noted. Electronically Signed   By: Gerome Sam III M.D   On: 02/05/2020 20:34   DG Chest Portable 1 View  Result Date: 02/05/2020 CLINICAL DATA:  Confusion EXAM: PORTABLE CHEST 1 VIEW COMPARISON:   10/17/2019 FINDINGS: The heart size and mediastinal contours are within normal limits. Both lungs are clear. The visualized skeletal structures are unremarkable. IMPRESSION: No active disease. Electronically Signed   By: Deatra Robinson M.D.   On: 02/05/2020 22:37      Impression / Plan:   Assessment: Principal Problem:   Severe sepsis (HCC) Active Problems:   Hypertension   Hyperlipidemia   AKI (acute kidney injury) (HCC)   Increased anion gap metabolic acidosis   Hyponatremia   Hypokalemia   Hypomagnesemia   Acute metabolic encephalopathy   History of UTI   Elijah Murphy is a 64 y.o. y/o male with a history of feeling weak with diarrhea and hematemesis.  The patient is clearly alert and awake today and orientated to person place and time. He denies having any hematemesis and states that his vomitus was clear.  He does endorse diarrhea. The patient's hemoglobin was decreased from 15.3 yesterday to 11.5 but his white cell count also went from 10.8-3.6 with the inability to have results on any of the other parts of the CBC due to an interfering substance. The patient has had dark urine as reported by the nursing staff.  Plan:  The patient will be monitored and started on a diet.  His labs will need to be repeated to see where his hemoglobin is going.  The idea that he may have hemolysis should be entertained with his bilirubin being up considering the elevated bilirubin is unconjugated.  The patient has early had rders for stool studies to rule out pathogens as the cause of his diarrhea although he states he has not had any further diarrhea since coming to the hospital. I will order a haptoglobin for this patient and will follow along with you.  Thank you for involving me in the care of this patient.      LOS: 1 day   Elijah Minium, MD, [facility] 02/06/2020, 2:29 PM,  Pager [phone] 7am-5pm  Check AMION for 5pm -7am coverage and on weekends   Note: This dictation was prepared with  Dragon dictation along with smaller phrase technology. Any transcriptional errors that result from this process are unintentional.

## 2020-02-06 NOTE — Progress Notes (Signed)
Pharmacy Antibiotic Note  Elijah Murphy is a 64 y.o. male admitted on 02/05/2020 with sepsis.  Pharmacy has been consulted for Vancomycin, Meropenem  dosing.  Plan: Vancomycin 1 gm IV X 1 given in ED on 12/3 @ 2352. Additional Vancomycin 1 gm IV X 1 ordered to make total loading dose of 2 gm. Vancomycin 750 mg IV Q12H ordered to continue on 12/4 @ 1200.  Meropenem 1 gm IV Q12H ordered to start on 12/4 @ ~ 0100.  Weight: 83 kg (182 lb 15.7 oz)  Temp (24hrs), Avg:97.8 F (36.6 C), Min:97.8 F (36.6 C), Max:97.8 F (36.6 C)  Recent Labs  Lab 02/05/20 1905 02/05/20 1951 02/05/20 2226  WBC 10.8*  --   --   CREATININE 1.66*  --   --   LATICACIDVEN  --  5.9* 2.7*    Estimated Creatinine Clearance: 45 mL/min (A) (by C-G formula based on SCr of 1.66 mg/dL (H)).    No Known Allergies  Antimicrobials this admission:   >>    >>   Dose adjustments this admission:   Microbiology results:  BCx:   UCx:    Sputum:    MRSA PCR:   Thank you for allowing pharmacy to be a part of this patient's care.  Hattie Pine D 02/06/2020 1:02 AM

## 2020-02-06 NOTE — Progress Notes (Signed)
Patient ID: Elijah Murphy, male   DOB: [birth year], 64 y.o.   MRN: [id number] [facility] PROGRESS NOTE  Elijah Murphy KDT:[id number] DOB: [birth year] DOA: 02/05/2020 PCP: Nira Retort  HPI/Subjective: Patient feeling better this morning.  I advance his diet.  Patient been having nausea vomiting and diarrhea prior to coming into the hospital.  No abdominal pain currently.  Admitted with severe sepsis.  Objective: Vitals:   02/06/20 1315 02/06/20 1530  BP:  (!) 140/98  Pulse: (!) 107 (!) 104  Resp: 14 20  Temp:    SpO2: 97% 99%    Intake/Output Summary (Last 24 hours) at 02/06/2020 1615 Last data filed at 02/05/2020 2311 Gross per 24 hour  Intake 500 ml  Output 200 ml  Net 300 ml   Filed Weights   02/05/20 2350  Weight: 83 kg    ROS: Review of Systems  Respiratory: Negative for shortness of breath.   Cardiovascular: Negative for chest pain.  Gastrointestinal: Positive for diarrhea, nausea and vomiting. Negative for abdominal pain.   Exam: Physical Exam HENT:     Head: Normocephalic.     Mouth/Throat:     Pharynx: No oropharyngeal exudate.  Eyes:     General: Lids are normal.     Conjunctiva/sclera: Conjunctivae normal.  Cardiovascular:     Rate and Rhythm: Normal rate and regular rhythm.     Heart sounds: Normal heart sounds, S1 normal and S2 normal.  Pulmonary:     Breath sounds: No decreased breath sounds, wheezing, rhonchi or rales.  Abdominal:     Palpations: Abdomen is soft.     Tenderness: There is no abdominal tenderness.  Musculoskeletal:     Right ankle: No swelling.     Left ankle: No swelling.  Skin:    General: Skin is warm.     Findings: No rash.  Neurological:     Mental Status: He is alert and oriented to person, place, and time.       Data Reviewed: Basic Metabolic Panel: Recent Labs  Lab 02/05/20 1905 02/06/20 0336  NA 119* 121*  K 2.8* 2.9*  CL 76* 87*  CO2 13* 17*  GLUCOSE 163* 178*  BUN 13 17  CREATININE 1.66*  1.17  CALCIUM 8.8* 7.3*  MG 1.6*  --    Liver Function Tests: Recent Labs  Lab 02/06/20 0336  AST 70*  74*  ALT 42  43  ALKPHOS 46  47  BILITOT 3.5*  3.7*  PROT 5.0*  5.2*  ALBUMIN 3.1*  3.2*   Recent Labs  Lab 02/06/20 0336  LIPASE 45   CBC: Recent Labs  Lab 02/05/20 1905 02/06/20 0336  WBC 10.8* 3.6*  NEUTROABS 7.9*  --   HGB 15.3 11.5*  HCT RESULTS UNAVAILABLE DUE TO INTERFERING SUBSTANCE RESULTS UNAVAILABLE DUE TO INTERFERING SUBSTANCE  MCV RESULTS UNAVAILABLE DUE TO INTERFERING SUBSTANCE RESULTS UNAVAILABLE DUE TO INTERFERING SUBSTANCE  PLT 191 PLATELET CLUMPS NOTED ON SMEAR, UNABLE TO ESTIMATE     Recent Results (from the past 240 hour(s))  Resp Panel by RT-PCR (Flu A&B, Covid) Nasopharyngeal Swab     Status: None   Collection Time: 02/05/20  8:39 PM   Specimen: Nasopharyngeal Swab; Nasopharyngeal(NP) swabs in vial transport medium  Result Value Ref Range Status   SARS Coronavirus 2 by RT PCR NEGATIVE NEGATIVE Final    Comment: (NOTE) SARS-CoV-2 target nucleic acids are NOT DETECTED.  The SARS-CoV-2 RNA is generally detectable in upper respiratory specimens during the acute phase  of infection. The lowest concentration of SARS-CoV-2 viral copies this assay can detect is 138 copies/mL. A negative result does not preclude SARS-Cov-2 infection and should not be used as the sole basis for treatment or other patient management decisions. A negative result may occur with  improper specimen collection/handling, submission of specimen other than nasopharyngeal swab, presence of viral mutation(s) within the areas targeted by this assay, and inadequate number of viral copies(<138 copies/mL). A negative result must be combined with clinical observations, patient history, and epidemiological information. The expected result is Negative.  Fact Sheet for Patients:  BloggerCourse.com  Fact Sheet for Healthcare Providers:   SeriousBroker.it  This test is no t yet approved or cleared by the [place] FDA and  has been authorized for detection and/or diagnosis of SARS-CoV-2 by FDA under an Emergency Use Authorization (EUA). This EUA will remain  in effect (meaning this test can be used) for the duration of the COVID-19 declaration under Section 564(b)(1) of the Act, 21 U.S.C.section 360bbb-3(b)(1), unless the authorization is terminated  or revoked sooner.       Influenza A by PCR NEGATIVE NEGATIVE Final   Influenza B by PCR NEGATIVE NEGATIVE Final    Comment: (NOTE) The Xpert Xpress SARS-CoV-2/FLU/RSV plus assay is intended as an aid in the diagnosis of influenza from Nasopharyngeal swab specimens and should not be used as a sole basis for treatment. Nasal washings and aspirates are unacceptable for Xpert Xpress SARS-CoV-2/FLU/RSV testing.  Fact Sheet for Patients: BloggerCourse.com  Fact Sheet for Healthcare Providers: SeriousBroker.it  This test is not yet approved or cleared by the [place] FDA and has been authorized for detection and/or diagnosis of SARS-CoV-2 by FDA under an Emergency Use Authorization (EUA). This EUA will remain in effect (meaning this test can be used) for the duration of the COVID-19 declaration under Section 564(b)(1) of the Act, 21 U.S.C. section 360bbb-3(b)(1), unless the authorization is terminated or revoked.  Performed at [facility], [address] [place], [place] [address]   Culture, blood (single)     Status: None (Preliminary result)   Collection Time: 02/05/20  8:50 PM   Specimen: BLOOD  Result Value Ref Range Status   Specimen Description BLOOD LEFT FOREARM  Final   Special Requests   Final    BOTTLES DRAWN AEROBIC AND ANAEROBIC Blood Culture results may not be optimal due to an inadequate volume of blood received in culture bottles   Culture   Final    NO  GROWTH < 12 HOURS Performed at [facility], [address] [place], [place] [address]    Report Status PENDING  Incomplete     Studies: CT ABDOMEN PELVIS WO CONTRAST  Result Date: 02/06/2020 CLINICAL DATA:  Renal failure.  Hematemesis EXAM: CT ABDOMEN AND PELVIS WITHOUT CONTRAST TECHNIQUE: Multidetector CT imaging of the abdomen and pelvis was performed following the standard protocol without IV contrast. COMPARISON:  None. FINDINGS: Lower chest: There are trace bilateral pleural effusions with adjacent mild atelectasis.The heart size is essentially normal. Hepatobiliary: There is severe hepatic steatosis. Normal gallbladder.There is no biliary ductal dilation. Pancreas: Normal contours without ductal dilatation. No peripancreatic fluid collection. Spleen: Unremarkable. Adrenals/Urinary Tract: --Adrenal glands: Unremarkable. --Right kidney/ureter: No hydronephrosis or radiopaque kidney stones. --Left kidney/ureter: No hydronephrosis or radiopaque kidney stones. --Urinary bladder: There is diffuse circumferential wall thickening of the urinary bladder. Stomach/Bowel: --Stomach/Duodenum: There appears to be esophageal wall thickening involving the partially visualized distal esophagus. --Small bowel: Unremarkable. --Colon: There are scattered  colonic diverticula without CT evidence for diverticulitis. --Appendix: Normal. Vascular/Lymphatic: Atherosclerotic calcification is present within the non-aneurysmal abdominal aorta, without hemodynamically significant stenosis. --No retroperitoneal lymphadenopathy. --No mesenteric lymphadenopathy. --No pelvic or inguinal lymphadenopathy. Reproductive: The prostate gland is enlarged. Other: No ascites or free air. The abdominal wall is normal. Musculoskeletal. No acute displaced fractures. IMPRESSION: 1. Probable wall thickening of the distal esophagus which can be seen in patients with esophagitis. 2. Circumferential wall thickening of the urinary bladder.  Correlate with urinalysis to exclude cystitis. 3. Severe hepatic steatosis. 4. Trace bilateral pleural effusions with adjacent mild atelectasis. 5. Colonic diverticulosis without CT evidence for diverticulitis. 6. Enlarged prostate gland. Aortic Atherosclerosis (ICD10-I70.0). Electronically Signed   By: Katherine Mantle M.D.   On: 02/06/2020 00:44   CT Head Wo Contrast  Result Date: 02/05/2020 CLINICAL DATA:  Fall.  Losing balance over the last few days. EXAM: CT HEAD WITHOUT CONTRAST TECHNIQUE: Contiguous axial images were obtained from the base of the skull through the vertex without intravenous contrast. COMPARISON:  None. FINDINGS: Brain: No subdural, epidural, or subarachnoid hemorrhage. Ventricles and sulci are stable. A lacunar infarct in the right internal capsule is more prominent interval remains small. Scattered white matter changes are otherwise unchanged. No acute cortical ischemia or infarct. No mass effect or midline shift. Cerebellum, brainstem, and basal cisterns are normal. Vascular: Calcified atherosclerosis in the intracranial carotids. Skull: Normal. Negative for fracture or focal lesion. Sinuses/Orbits: No acute finding. Other: None. IMPRESSION: Chronic white matter changes. Chronic right internal capsule lacunar infarct, more prominent the interval. No acute intracranial abnormalities are noted. Electronically Signed   By: Gerome Sam III M.D   On: 02/05/2020 20:34   DG Chest Portable 1 View  Result Date: 02/05/2020 CLINICAL DATA:  Confusion EXAM: PORTABLE CHEST 1 VIEW COMPARISON:  10/17/2019 FINDINGS: The heart size and mediastinal contours are within normal limits. Both lungs are clear. The visualized skeletal structures are unremarkable. IMPRESSION: No active disease. Electronically Signed   By: Deatra Robinson M.D.   On: 02/05/2020 22:37    Scheduled Meds: . lisinopril  10 mg Oral Daily  . pantoprazole (PROTONIX) IV  40 mg Intravenous Q12H   Continuous Infusions: . 0.9  % NaCl with KCl 20 mEq / L 75 mL/hr at 02/06/20 0759  . meropenem (MERREM) IV    . vancomycin Stopped (02/06/20 1600)    Assessment/Plan:  1. Systemic inflammatory response syndrome with tachycardia, leukocytosis and elevated lactic acid of 5.9.  I do not have a source of infection but did have gastroenteritis.  Awaiting stool studies.  Empirically put on antibiotics meropenem and vancomycin.  Procalcitonin elevated at 2.6. 2. Acute gastroenteritis with nausea vomiting diarrhea.  Supportive care with IV fluids as needed nausea medications advance diet. 3. Severe hyponatremia.  IV fluid hydration.  Likely from nausea vomiting and diarrhea.  Nephrology consult appreciated. 4. Hypokalemia.  Replace potassium and IV fluids 5. Acute kidney injury with creatinine of 1.66 on presentation down to 1.17 with IV fluid hydration.  Continue IV fluids. 6. Hypomagnesemia replaced in the emergency room.  Magnesium 1.6 in the ER 7. Essential hypertension blood pressure is elevated today restart lisinopril 8. Anemia,  possible esophagitis on CT scan.  Doubt coffee-ground emesis.  GI does not plan on doing a procedure at this point.  Empiric IV Protonix.  Serial hemoglobins.  We will get a peripheral smear in a.m.  Haptoglobin ordered.     Code Status:     Code Status Orders  (  From admission, onward)         Start     Ordered   02/05/20 2237  Full code  Continuous        02/05/20 2241        Code Status History    This patient has a current code status but no historical code status.   Advance Care Planning Activity     Family Communication: Spoke with patient's wife on the phone Disposition Plan: Status is: Inpatient  Dispo: The patient is from: Home              Anticipated d/c is to: Home              Anticipated d/c date is: Likely will require few days here in the hospital              Patient currently receiving IV fluids for low sodium and low potassium  Time spent: 29  minutes  Virgie Chery [employer]  [facility]

## 2020-02-06 NOTE — Progress Notes (Signed)
SLP Cancellation Note  Patient Details Name: MAXIMUM REILAND MRN: [id number] DOB: 04/01/1955   Cancelled treatment:       Reason Eval/Treat Not Completed: SLP screened, no needs identified, will sign off  Chart reviewed, nurse consulted, pt consumed lunch without any s/s of dysphagia or aspiration. No acute deficits identified.   Cletus Mehlhoff B. Dreama Saa M.S., CCC-SLP, [facility] Speech-Language Pathologist Rehabilitation Services Office [phone]   Reuel Derby 02/06/2020, 3:48 PM

## 2020-02-06 NOTE — Consult Note (Signed)
[facility] CONSULT NOTE    Date: 02/06/2020                  Patient Name:  Elijah HickmanDonald L Kassim  MRN: [id number]  DOB: 08/09/1955  Age / Sex: 64 y.o., male         PCP: [facility]                 Service Requesting Consult:  Hospitalist                 Reason for Consult: Acute kidney injury/hyponatremia            History of Present Illness: Patient is a 64 y.o. male with a PMHx of hypertension, hyperlipidemia, anxiety, depression, right eye blindness, prior history of CVA, who was admitted to [facility] on 02/05/2020 for evaluation of fall with 2-week history of progressive weakness, nausea, vomiting, diarrhea, and poor p.o. intake.  At the moment patient is a poor historian.  Relates that he has had at least 1 week of diminished intake as well as vomiting and diarrhea.  We are asked to see him for hyponatremia as well as acute kidney injury.  Upon presentation creatinine was 1.6.  He was started on IV fluid hydration and creatinine now down to 1.17.  Upon presentation serum sodium was 119.  Currently up to 121.  Also noted to be hypokalemic with a potassium of 2.9.  Patient also found to have lactic acidosis.  Upon admission lactic acid was 5.9 and now down to 2.7.   Medications: Outpatient medications: (Not in a hospital admission)   Current medications: Current Facility-Administered Medications  Medication Dose Route Frequency Provider Last Rate Last Admin  . 0.9 % NaCl with KCl 20 mEq/ L  infusion   Intravenous Continuous Alford HighlandWieting, Richard, MD 75 mL/hr at 02/06/20 0759 New Bag at 02/06/20 0759  . acetaminophen (TYLENOL) tablet 650 mg  650 mg Oral Q6H PRN Andris Baumannuncan, Hazel V, MD       Or  . acetaminophen (TYLENOL) suppository 650 mg  650 mg Rectal Q6H PRN Andris Baumannuncan, Hazel V, MD      . lisinopril (ZESTRIL) tablet 10 mg  10 mg Oral Daily Alford HighlandWieting, Richard, MD   10 mg at 02/06/20 1120  . meropenem (MERREM) 1 g in sodium chloride 0.9 % 100 mL IVPB  1 g Intravenous  Q12H Andris Baumannuncan, Hazel V, MD   Stopped at 02/06/20 1029  . ondansetron (ZOFRAN) tablet 4 mg  4 mg Oral Q6H PRN Andris Baumannuncan, Hazel V, MD       Or  . ondansetron [facility](ZOFRAN) injection 4 mg  4 mg Intravenous Q6H PRN Andris Baumannuncan, Hazel V, MD      . pantoprazole (PROTONIX) injection 40 mg  40 mg Intravenous Q12H Manuela SchwartzMorrison, Brenda, NP   40 mg at 02/06/20 0929  . vancomycin (VANCOREADY) IVPB 750 mg/150 mL  750 mg Intravenous Q12H Andris Baumannuncan, Hazel V, MD 150 mL/hr at 02/06/20 1349 750 mg at 02/06/20 1349   Current Outpatient Medications  Medication Sig Dispense Refill  . lisinopril (ZESTRIL) 10 MG tablet Take 10 mg by mouth daily.         Allergies: No Known Allergies    Past Medical History: Past Medical History:  Diagnosis Date  . Hypertension      Past Surgical History: Past Surgical History:  Procedure Laterality Date  . APPENDECTOMY    . KNEE SURGERY Left   . TONSILLECTOMY       Family  History: History reviewed. No pertinent family history.   Social History: Social History   Socioeconomic History  . Marital status: Married    Spouse name: Not on file  . Number of children: Not on file  . Years of education: Not on file  . Highest education level: Not on file  Occupational History  . Not on file  Tobacco Use  . Smoking status: Never Smoker  . Smokeless tobacco: Current User    Types: Chew  Vaping Use  . Vaping Use: Never used  Substance and Sexual Activity  . Alcohol use: Yes    Comment: occasionally  . Drug use: No  . Sexual activity: Not on file  Other Topics Concern  . Not on file  Social History Narrative  . Not on file   Social Determinants of Health   Financial Resource Strain:   . Difficulty of Paying Living Expenses: Not on file  Food Insecurity:   . Worried About Programme researcher, broadcasting/film/video in the Last Year: Not on file  . Ran Out of Food in the Last Year: Not on file  Transportation Needs:   . Lack of Transportation (Medical): Not on file  . Lack of Transportation  (Non-Medical): Not on file  Physical Activity:   . Days of Exercise per Week: Not on file  . Minutes of Exercise per Session: Not on file  Stress:   . Feeling of Stress : Not on file  Social Connections:   . Frequency of Communication with Friends and Family: Not on file  . Frequency of Social Gatherings with Friends and Family: Not on file  . Attends Religious Services: Not on file  . Active Member of Clubs or Organizations: Not on file  . Attends Banker Meetings: Not on file  . Marital Status: Not on file  Intimate Partner Violence:   . Fear of Current or Ex-Partner: Not on file  . Emotionally Abused: Not on file  . Physically Abused: Not on file  . Sexually Abused: Not on file     Review of Systems: Review of Systems  Constitutional: Positive for malaise/fatigue. Negative for chills and fever.  HENT: Negative for congestion, hearing loss and tinnitus.   Eyes: Negative for blurred vision and double vision.  Respiratory: Negative for cough, sputum production and shortness of breath.   Cardiovascular: Negative for chest pain, palpitations and orthopnea.  Gastrointestinal: Positive for diarrhea, nausea and vomiting.  Genitourinary: Negative for dysuria, frequency and urgency.  Musculoskeletal: Positive for falls. Negative for myalgias.  Skin: Negative for itching and rash.  Neurological: Negative for dizziness and focal weakness.  Endo/Heme/Allergies: Negative for polydipsia. Does not bruise/bleed easily.  Psychiatric/Behavioral: The patient is not nervous/anxious.     Vital Signs: Blood pressure (!) 173/101, pulse (!) 107, temperature 97.7 F (36.5 C), temperature source Oral, resp. rate 14, weight 83 kg, SpO2 97 %.  Weight trends: Filed Weights   02/05/20 2350  Weight: 83 kg    Physical Exam: Physical Exam: General:  No acute distress  Head:  Normocephalic, atraumatic. Moist oral mucosal membranes  Eyes:  Anicteric  Neck:  Supple  Lungs:   Clear to  auscultation, normal effort  Heart:  S1S2 no rubs  Abdomen:   Soft, nontender, bowel sounds present  Extremities:  No peripheral edema.  Neurologic:  Awake, alert, following commands  Skin:  No lesions  Access:  No hemodialysis access    Lab results: Basic Metabolic Panel: Recent Labs  Lab 02/05/20 1905  02/06/20 0336  NA 119* 121*  K 2.8* 2.9*  CL 76* 87*  CO2 13* 17*  GLUCOSE 163* 178*  BUN 13 17  CREATININE 1.66* 1.17  CALCIUM 8.8* 7.3*  MG 1.6*  --     Liver Function Tests: Recent Labs  Lab 02/06/20 0336  AST 70*  74*  ALT 42  43  ALKPHOS 46  47  BILITOT 3.5*  3.7*  PROT 5.0*  5.2*  ALBUMIN 3.1*  3.2*   Recent Labs  Lab 02/06/20 0336  LIPASE 45   No results for input(s): AMMONIA in the last 168 hours.  CBC: Recent Labs  Lab 02/05/20 1905 02/06/20 0336  WBC 10.8* 3.6*  NEUTROABS 7.9*  --   HGB 15.3 11.5*  HCT RESULTS UNAVAILABLE DUE TO INTERFERING SUBSTANCE RESULTS UNAVAILABLE DUE TO INTERFERING SUBSTANCE  MCV RESULTS UNAVAILABLE DUE TO INTERFERING SUBSTANCE RESULTS UNAVAILABLE DUE TO INTERFERING SUBSTANCE  PLT 191 PLATELET CLUMPS NOTED ON SMEAR, UNABLE TO ESTIMATE    Cardiac Enzymes: No results for input(s): CKTOTAL, CKMB, CKMBINDEX, TROPONINI in the last 168 hours.  BNP: Invalid input(s): POCBNP  CBG: No results for input(s): GLUCAP in the last 168 hours.  Microbiology: Results for orders placed or performed during the hospital encounter of 02/05/20  Resp Panel by RT-PCR (Flu A&B, Covid) Nasopharyngeal Swab     Status: None   Collection Time: 02/05/20  8:39 PM   Specimen: Nasopharyngeal Swab; Nasopharyngeal(NP) swabs in vial transport medium  Result Value Ref Range Status   SARS Coronavirus 2 by RT PCR NEGATIVE NEGATIVE Final    Comment: (NOTE) SARS-CoV-2 target nucleic acids are NOT DETECTED.  The SARS-CoV-2 RNA is generally detectable in upper respiratory specimens during the acute phase of infection. The lowest concentration  of SARS-CoV-2 viral copies this assay can detect is 138 copies/mL. A negative result does not preclude SARS-Cov-2 infection and should not be used as the sole basis for treatment or other patient management decisions. A negative result may occur with  improper specimen collection/handling, submission of specimen other than nasopharyngeal swab, presence of viral mutation(s) within the areas targeted by this assay, and inadequate number of viral copies(<138 copies/mL). A negative result must be combined with clinical observations, patient history, and epidemiological information. The expected result is Negative.  Fact Sheet for Patients:  BloggerCourse.com  Fact Sheet for Healthcare Providers:  SeriousBroker.it  This test is no t yet approved or cleared by the [place] FDA and  has been authorized for detection and/or diagnosis of SARS-CoV-2 by FDA under an Emergency Use Authorization (EUA). This EUA will remain  in effect (meaning this test can be used) for the duration of the COVID-19 declaration under Section 564(b)(1) of the Act, 21 U.S.C.section 360bbb-3(b)(1), unless the authorization is terminated  or revoked sooner.       Influenza A by PCR NEGATIVE NEGATIVE Final   Influenza B by PCR NEGATIVE NEGATIVE Final    Comment: (NOTE) The Xpert Xpress SARS-CoV-2/FLU/RSV plus assay is intended as an aid in the diagnosis of influenza from Nasopharyngeal swab specimens and should not be used as a sole basis for treatment. Nasal washings and aspirates are unacceptable for Xpert Xpress SARS-CoV-2/FLU/RSV testing.  Fact Sheet for Patients: BloggerCourse.com  Fact Sheet for Healthcare Providers: SeriousBroker.it  This test is not yet approved or cleared by the [place] FDA and has been authorized for detection and/or diagnosis of SARS-CoV-2 by FDA under an Emergency Use  Authorization (EUA). This EUA will remain in effect (meaning  this test can be used) for the duration of the COVID-19 declaration under Section 564(b)(1) of the Act, 21 U.S.C. section 360bbb-3(b)(1), unless the authorization is terminated or revoked.  Performed at [facility], [address] [place], [place] [address]   Culture, blood (single)     Status: None (Preliminary result)   Collection Time: 02/05/20  8:50 PM   Specimen: BLOOD  Result Value Ref Range Status   Specimen Description BLOOD LEFT FOREARM  Final   Special Requests   Final    BOTTLES DRAWN AEROBIC AND ANAEROBIC Blood Culture results may not be optimal due to an inadequate volume of blood received in culture bottles   Culture   Final    NO GROWTH < 12 HOURS Performed at [facility], [address], [place], [place] [address]    Report Status PENDING  Incomplete    Coagulation Studies: Recent Labs    02/06/20 0336  LABPROT 14.5  INR 1.2    Urinalysis: Recent Labs    02/05/20 2310  COLORURINE YELLOW*  LABSPEC 1.014  PHURINE 6.0  GLUCOSEU NEGATIVE  HGBUR MODERATE*  BILIRUBINUR NEGATIVE  KETONESUR 20*  PROTEINUR 100*  NITRITE NEGATIVE  LEUKOCYTESUR NEGATIVE      Imaging: CT ABDOMEN PELVIS WO CONTRAST  Result Date: 02/06/2020 CLINICAL DATA:  Renal failure.  Hematemesis EXAM: CT ABDOMEN AND PELVIS WITHOUT CONTRAST TECHNIQUE: Multidetector CT imaging of the abdomen and pelvis was performed following the standard protocol without IV contrast. COMPARISON:  None. FINDINGS: Lower chest: There are trace bilateral pleural effusions with adjacent mild atelectasis.The heart size is essentially normal. Hepatobiliary: There is severe hepatic steatosis. Normal gallbladder.There is no biliary ductal dilation. Pancreas: Normal contours without ductal dilatation. No peripancreatic fluid collection. Spleen: Unremarkable. Adrenals/Urinary Tract: --Adrenal glands: Unremarkable. --Right kidney/ureter:  No hydronephrosis or radiopaque kidney stones. --Left kidney/ureter: No hydronephrosis or radiopaque kidney stones. --Urinary bladder: There is diffuse circumferential wall thickening of the urinary bladder. Stomach/Bowel: --Stomach/Duodenum: There appears to be esophageal wall thickening involving the partially visualized distal esophagus. --Small bowel: Unremarkable. --Colon: There are scattered colonic diverticula without CT evidence for diverticulitis. --Appendix: Normal. Vascular/Lymphatic: Atherosclerotic calcification is present within the non-aneurysmal abdominal aorta, without hemodynamically significant stenosis. --No retroperitoneal lymphadenopathy. --No mesenteric lymphadenopathy. --No pelvic or inguinal lymphadenopathy. Reproductive: The prostate gland is enlarged. Other: No ascites or free air. The abdominal wall is normal. Musculoskeletal. No acute displaced fractures. IMPRESSION: 1. Probable wall thickening of the distal esophagus which can be seen in patients with esophagitis. 2. Circumferential wall thickening of the urinary bladder. Correlate with urinalysis to exclude cystitis. 3. Severe hepatic steatosis. 4. Trace bilateral pleural effusions with adjacent mild atelectasis. 5. Colonic diverticulosis without CT evidence for diverticulitis. 6. Enlarged prostate gland. Aortic Atherosclerosis (ICD10-I70.0). Electronically Signed   By: Katherine Mantle M.D.   On: 02/06/2020 00:44   CT Head Wo Contrast  Result Date: 02/05/2020 CLINICAL DATA:  Fall.  Losing balance over the last few days. EXAM: CT HEAD WITHOUT CONTRAST TECHNIQUE: Contiguous axial images were obtained from the base of the skull through the vertex without intravenous contrast. COMPARISON:  None. FINDINGS: Brain: No subdural, epidural, or subarachnoid hemorrhage. Ventricles and sulci are stable. A lacunar infarct in the right internal capsule is more prominent interval remains small. Scattered white matter changes are otherwise  unchanged. No acute cortical ischemia or infarct. No mass effect or midline shift. Cerebellum, brainstem, and basal cisterns are normal. Vascular: Calcified atherosclerosis in the intracranial carotids. Skull: Normal. Negative for fracture  or focal lesion. Sinuses/Orbits: No acute finding. Other: None. IMPRESSION: Chronic white matter changes. Chronic right internal capsule lacunar infarct, more prominent the interval. No acute intracranial abnormalities are noted. Electronically Signed   By: Gerome Sam III M.D   On: 02/05/2020 20:34   DG Chest Portable 1 View  Result Date: 02/05/2020 CLINICAL DATA:  Confusion EXAM: PORTABLE CHEST 1 VIEW COMPARISON:  10/17/2019 FINDINGS: The heart size and mediastinal contours are within normal limits. Both lungs are clear. The visualized skeletal structures are unremarkable. IMPRESSION: No active disease. Electronically Signed   By: Deatra Robinson M.D.   On: 02/05/2020 22:37      Assessment & Plan: Pt is a 64 y.o. male with a PMHx of hypertension, hyperlipidemia, anxiety, depression, right eye blindness, prior history of CVA, who was admitted to [facility] on 02/05/2020 for evaluation of fall with 2-week history of progressive weakness, nausea, vomiting, diarrhea, and poor p.o. intake.  1.  Acute kidney injury.  Baseline creatinine appears to be 1.0.  Acute kidney injury now likely related to volume depletion and dehydration from prolonged nausea, vomiting, and diminished p.o. intake.  Upon presentation creatinine was 1.7.  Now down to 1.1 with IV fluid hydration.  Continue hydration at this time.  No hydronephrosis noted.  2.  Hyponatremia.  Serum sodium was 119 upon admission.  Now up to 121.  Suspect due to volume depletion.  Continue IV fluids with 0.9 normal saline supplemented with potassium chloride at 75 cc/h.  3.  Hypokalemia.  Likely due to nausea, vomiting, and poor p.o. intake.  Patient has been given separate rounds of potassium chloride as well as IV  fluids fortified with potassium.  Continue to monitor potassium closely.  4.  Bladder wall thickening.  Could potentially be compatible with urinary tract infection.  Has received cefepime, meropenem, and vancomycin.

## 2020-02-06 NOTE — ED Notes (Signed)
700 ml dark amber colored urine emptied from suction container

## 2020-02-06 NOTE — ED Notes (Signed)
800 ml of dark amber color urine emptied from suction canister

## 2020-02-06 NOTE — ED Notes (Signed)
Pt resting comfortably at this time. Wife at bedside. No distress noted. Pt is A&Ox4. NAD noted. Male external cathter placed due to increased frequency of  Urination. VSS. Call bell in reach.

## 2020-02-06 NOTE — ED Notes (Signed)
Secure chat sent to accepting RN

## 2020-02-06 NOTE — ED Notes (Signed)
Pt has 2 pillows and prescription glasses going with him to the floor.

## 2020-02-06 NOTE — ED Notes (Signed)
Brief changed, complete linen change, incontinence care provided.

## 2020-02-07 DIAGNOSIS — A498 Other bacterial infections of unspecified site: Secondary | ICD-10-CM | POA: Diagnosis not present

## 2020-02-07 DIAGNOSIS — A09 Infectious gastroenteritis and colitis, unspecified: Secondary | ICD-10-CM | POA: Diagnosis not present

## 2020-02-07 LAB — IRON AND TIBC
Iron: 81 ug/dL (ref 45–182)
Saturation Ratios: 44 % — ABNORMAL HIGH (ref 17.9–39.5)
TIBC: 185 ug/dL — ABNORMAL LOW (ref 250–450)
UIBC: 104 ug/dL

## 2020-02-07 LAB — GASTROINTESTINAL PANEL BY PCR, STOOL (REPLACES STOOL CULTURE)

## 2020-02-07 LAB — BASIC METABOLIC PANEL
Anion gap: 11 (ref 5–15)
BUN: 13 mg/dL (ref 8–23)
CO2: 22 mmol/L (ref 22–32)
Calcium: 7.7 mg/dL — ABNORMAL LOW (ref 8.9–10.3)
Chloride: 96 mmol/L — ABNORMAL LOW (ref 98–111)
Creatinine, Ser: 0.81 mg/dL (ref 0.61–1.24)
GFR, Estimated: >60 mL/min (ref >60)
Glucose, Bld: 104 mg/dL — ABNORMAL HIGH (ref 70–99)
Potassium: 2.8 mmol/L — ABNORMAL LOW (ref 3.5–5.1)
Sodium: 129 mmol/L — ABNORMAL LOW (ref 135–145)

## 2020-02-07 LAB — TECHNOLOGIST SMEAR REVIEW: Plt Morphology: DECREASED

## 2020-02-07 LAB — VITAMIN B12: Vitamin B-12: 572 pg/mL (ref 180–914)

## 2020-02-07 LAB — CBC
HCT: 28.4 % — ABNORMAL LOW (ref 39.0–52.0)
Hemoglobin: 10.6 g/dL — ABNORMAL LOW (ref 13.0–17.0)
MCH: 36.1 pg — ABNORMAL HIGH (ref 26.0–34.0)
MCHC: 37.3 g/dL — ABNORMAL HIGH (ref 30.0–36.0)
MCV: 96.6 fL (ref 80.0–100.0)
Platelets: 89 10*3/uL — ABNORMAL LOW (ref 150–400)
RBC: 2.94 MIL/uL — ABNORMAL LOW (ref 4.22–5.81)
RDW: 12.8 % (ref 11.5–15.5)
WBC: 3.6 10*3/uL — ABNORMAL LOW (ref 4.0–10.5)
nRBC: 0 % (ref 0.0–0.2)

## 2020-02-07 LAB — C DIFFICILE QUICK SCREEN W PCR REFLEX
C Diff antigen: NEGATIVE
C Diff interpretation: NOT DETECTED
C Diff toxin: NEGATIVE

## 2020-02-07 LAB — MAGNESIUM: Magnesium: 1.8 mg/dL (ref 1.7–2.4)

## 2020-02-07 LAB — FERRITIN: Ferritin: 1655 ng/mL — ABNORMAL HIGH (ref 24–336)

## 2020-02-07 LAB — LACTATE DEHYDROGENASE: LDH: 192 U/L (ref 98–192)

## 2020-02-07 MED ORDER — POTASSIUM CHLORIDE CRYS ER 20 MEQ PO TBCR
40.0000 meq | EXTENDED_RELEASE_TABLET | Freq: Three times a day (TID) | ORAL | Status: DC
  Administered 2020-02-07: 40 meq via ORAL
  Filled 2020-02-07: qty 2

## 2020-02-07 MED ORDER — HYDRALAZINE HCL 20 MG/ML IJ SOLN
10.0000 mg | Freq: Four times a day (QID) | INTRAMUSCULAR | Status: DC | PRN
  Administered 2020-02-07 – 2020-02-08 (×2): 10 mg via INTRAVENOUS
  Filled 2020-02-07 (×2): qty 1

## 2020-02-07 NOTE — Progress Notes (Signed)
PHARMACY CONSULT NOTE - FOLLOW UP  Pharmacy Consult for Electrolyte Monitoring and Replacement   Recent Labs: Potassium (mmol/L)  Date Value  02/07/2020 2.8 (L)  04/23/2013 3.6   Magnesium (mg/dL)  Date Value  73/41/9379 1.6 (L)   Calcium (mg/dL)  Date Value  02/40/9735 7.7 (L)   Calcium, Total (mg/dL)  Date Value  32/99/2426 8.2 (L)   Albumin (g/dL)  Date Value  83/41/9622 3.2 (L)  02/06/2020 3.1 (L)  04/23/2013 4.1   Sodium (mmol/L)  Date Value  02/07/2020 129 (L)  04/23/2013 128 (L)     Assessment: 64 year old male with hyponatremia and hypokalemia. Pharmacy to manage electrolytes.  Goal of Therapy:  Electrolytes  Plan:  Order for potassium 40 mEq PO TID. Patient also on fluids with potassium supplementation. Will recheck electrolytes with morning labs.  Pricilla Riffle ,PharmD Clinical Pharmacist 02/07/2020 2:43 PM

## 2020-02-07 NOTE — Progress Notes (Signed)
[facility]  ROUNDING NOTE   Subjective:  Patient is a 64 y.o. male with a PMHx of hypertension, hyperlipidemia, anxiety, depression, right eye blindness, prior history of CVA, who was admitted to [facility] on 02/05/2020 for evaluation of fall with 2-week history of progressive weakness, nausea, vomiting, diarrhea, and poor p.o. intake.Nephrology was consulted for the evaluation of hyponatremia and AKI.  Patient resting in bed, in no acute distress, reports 'feeling much better' today. IVF on flow. Denies nausea and vomiting today.   Objective:  Vital signs in last 24 hours:  Temp:  [98 F (36.7 C)-99 F (37.2 C)] 98.4 F (36.9 C) (12/05 0813) Pulse Rate:  [79-108] 89 (12/05 0813) Resp:  [12-22] 15 (12/05 0813) BP: (132-178)/(75-129) 178/111 (12/05 0813) SpO2:  [94 %-100 %] 100 % (12/05 0813) Weight:  [80.9 kg] 80.9 kg (12/04 2320)  Weight change: -2.1 kg Filed Weights   02/05/20 2350 02/06/20 2320  Weight: 83 kg 80.9 kg    Intake/Output: I/O last 3 completed shifts: In: 2641.3 [P.O.:480; I.V.:1411.3; IV Piggyback:750] Out: 1150 [Urine:1150]   Intake/Output this shift:  Total I/O In: -  Out: 600 [Urine:600]  Physical Exam: General: No acute distress  Head:  Moist oral mucosal membranes  Eyes: Sclerae and conjunctivae clear  Lungs:  Clear to auscultation bilaterally, normal respiratory effort  Heart: Regular rate and rhythm  Abdomen:  Soft, nontender,   Extremities:   Trace peripheral edema.  Neurologic: Awake, alert, speech clear  Skin: No acute  Lesions or rashes    Basic Metabolic Panel: Recent Labs  Lab 02/05/20 1905 02/05/20 1905 02/06/20 0336 02/06/20 1617 02/07/20 0346  NA 119*  --  121* 126* 129*  K 2.8*  --  2.9* 2.4* 2.8*  CL 76*  --  87* 93* 96*  CO2 13*  --  17* 20* 22  GLUCOSE 163*  --  178* 110* 104*  BUN 13  --  17 16 13   CREATININE 1.66*  --  1.17 0.87 0.81  CALCIUM 8.8*   < > 7.3* 7.7* 7.7*  MG 1.6*  --   --   --   --    < > =  values in this interval not displayed.    Liver Function Tests: Recent Labs  Lab 02/06/20 0336  AST 70*  74*  ALT 42  43  ALKPHOS 46  47  BILITOT 3.5*  3.7*  PROT 5.0*  5.2*  ALBUMIN 3.1*  3.2*   Recent Labs  Lab 02/06/20 0336  LIPASE 45   No results for input(s): AMMONIA in the last 168 hours.  CBC: Recent Labs  Lab 02/05/20 1905 02/06/20 0336 02/07/20 0346  WBC 10.8* 3.6* 3.6*  NEUTROABS 7.9*  --   --   HGB 15.3 11.5* 10.6*  HCT RESULTS UNAVAILABLE DUE TO INTERFERING SUBSTANCE RESULTS UNAVAILABLE DUE TO INTERFERING SUBSTANCE 28.4*  MCV RESULTS UNAVAILABLE DUE TO INTERFERING SUBSTANCE RESULTS UNAVAILABLE DUE TO INTERFERING SUBSTANCE 96.6  PLT 191 PLATELET CLUMPS NOTED ON SMEAR, UNABLE TO ESTIMATE 89*    Cardiac Enzymes: No results for input(s): CKTOTAL, CKMB, CKMBINDEX, TROPONINI in the last 168 hours.  BNP: Invalid input(s): POCBNP  CBG: No results for input(s): GLUCAP in the last 168 hours.  Microbiology: Results for orders placed or performed during the hospital encounter of 02/05/20  Resp Panel by RT-PCR (Flu A&B, Covid) Nasopharyngeal Swab     Status: None   Collection Time: 02/05/20  8:39 PM   Specimen: Nasopharyngeal Swab; Nasopharyngeal(NP) swabs in  vial transport medium  Result Value Ref Range Status   SARS Coronavirus 2 by RT PCR NEGATIVE NEGATIVE Final    Comment: (NOTE) SARS-CoV-2 target nucleic acids are NOT DETECTED.  The SARS-CoV-2 RNA is generally detectable in upper respiratory specimens during the acute phase of infection. The lowest concentration of SARS-CoV-2 viral copies this assay can detect is 138 copies/mL. A negative result does not preclude SARS-Cov-2 infection and should not be used as the sole basis for treatment or other patient management decisions. A negative result may occur with  improper specimen collection/handling, submission of specimen other than nasopharyngeal swab, presence of viral mutation(s) within  the areas targeted by this assay, and inadequate number of viral copies(<138 copies/mL). A negative result must be combined with clinical observations, patient history, and epidemiological information. The expected result is Negative.  Fact Sheet for Patients:  BloggerCourse.com  Fact Sheet for Healthcare Providers:  SeriousBroker.it  This test is no t yet approved or cleared by the [place] FDA and  has been authorized for detection and/or diagnosis of SARS-CoV-2 by FDA under an Emergency Use Authorization (EUA). This EUA will remain  in effect (meaning this test can be used) for the duration of the COVID-19 declaration under Section 564(b)(1) of the Act, 21 U.S.C.section 360bbb-3(b)(1), unless the authorization is terminated  or revoked sooner.       Influenza A by PCR NEGATIVE NEGATIVE Final   Influenza B by PCR NEGATIVE NEGATIVE Final    Comment: (NOTE) The Xpert Xpress SARS-CoV-2/FLU/RSV plus assay is intended as an aid in the diagnosis of influenza from Nasopharyngeal swab specimens and should not be used as a sole basis for treatment. Nasal washings and aspirates are unacceptable for Xpert Xpress SARS-CoV-2/FLU/RSV testing.  Fact Sheet for Patients: BloggerCourse.com  Fact Sheet for Healthcare Providers: SeriousBroker.it  This test is not yet approved or cleared by the [place] FDA and has been authorized for detection and/or diagnosis of SARS-CoV-2 by FDA under an Emergency Use Authorization (EUA). This EUA will remain in effect (meaning this test can be used) for the duration of the COVID-19 declaration under Section 564(b)(1) of the Act, 21 U.S.C. section 360bbb-3(b)(1), unless the authorization is terminated or revoked.  Performed at [facility], [address] [place], [place] [address]   Culture, blood (single)     Status: None  (Preliminary result)   Collection Time: 02/05/20  8:50 PM   Specimen: BLOOD  Result Value Ref Range Status   Specimen Description BLOOD LEFT FOREARM  Final   Special Requests   Final    BOTTLES DRAWN AEROBIC AND ANAEROBIC Blood Culture results may not be optimal due to an inadequate volume of blood received in culture bottles   Culture   Final    NO GROWTH 2 DAYS Performed at [facility], [address] [place], [place] [address]    Report Status PENDING  Incomplete  Gastrointestinal Panel by PCR , Stool     Status: Abnormal   Collection Time: 02/07/20  4:30 AM   Specimen: STOOL  Result Value Ref Range Status   Campylobacter species NOT DETECTED NOT DETECTED Final   Plesimonas shigelloides NOT DETECTED NOT DETECTED Final   Salmonella species NOT DETECTED NOT DETECTED Final   Yersinia enterocolitica NOT DETECTED NOT DETECTED Final   Vibrio species NOT DETECTED NOT DETECTED Final   Vibrio cholerae NOT DETECTED NOT DETECTED Final   Enteroaggregative E coli (EAEC) NOT DETECTED NOT DETECTED Final   Enteropathogenic E coli (  EPEC) DETECTED (A) NOT DETECTED Final    Comment: RESULT CALLED TO, READ BACK BY AND VERIFIED WITH:  MARIA [facility] AT 2841 02/07/20 ZSDR    Enterotoxigenic E coli (ETEC) NOT DETECTED NOT DETECTED Final   Shiga like toxin producing E coli (STEC) NOT DETECTED NOT DETECTED Final   Shigella/Enteroinvasive E coli (EIEC) NOT DETECTED NOT DETECTED Final   Cryptosporidium NOT DETECTED NOT DETECTED Final   Cyclospora cayetanensis NOT DETECTED NOT DETECTED Final   Entamoeba histolytica NOT DETECTED NOT DETECTED Final   Giardia lamblia NOT DETECTED NOT DETECTED Final   Adenovirus F40/41 NOT DETECTED NOT DETECTED Final   Astrovirus NOT DETECTED NOT DETECTED Final   Norovirus GI/GII NOT DETECTED NOT DETECTED Final   Rotavirus A NOT DETECTED NOT DETECTED Final   Sapovirus (I, II, IV, and V) NOT DETECTED NOT DETECTED Final    Comment: Performed at [facility], [address] [place], [place] [address]  C Difficile Quick Screen w PCR reflex     Status: None   Collection Time: 02/07/20  4:30 AM   Specimen: STOOL  Result Value Ref Range Status   C Diff antigen NEGATIVE NEGATIVE Final   C Diff toxin NEGATIVE NEGATIVE Final   C Diff interpretation No C. difficile detected.  Final    Comment: Performed at [facility], [address] [place], [place] [address]    Coagulation Studies: Recent Labs    02/06/20 0336  LABPROT 14.5  INR 1.2    Urinalysis: Recent Labs    02/05/20 2310  COLORURINE YELLOW*  LABSPEC 1.014  PHURINE 6.0  GLUCOSEU NEGATIVE  HGBUR MODERATE*  BILIRUBINUR NEGATIVE  KETONESUR 20*  PROTEINUR 100*  NITRITE NEGATIVE  LEUKOCYTESUR NEGATIVE      Imaging: CT ABDOMEN PELVIS WO CONTRAST  Result Date: 02/06/2020 CLINICAL DATA:  Renal failure.  Hematemesis EXAM: CT ABDOMEN AND PELVIS WITHOUT CONTRAST TECHNIQUE: Multidetector CT imaging of the abdomen and pelvis was performed following the standard protocol without IV contrast. COMPARISON:  None. FINDINGS: Lower chest: There are trace bilateral pleural effusions with adjacent mild atelectasis.The heart size is essentially normal. Hepatobiliary: There is severe hepatic steatosis. Normal gallbladder.There is no biliary ductal dilation. Pancreas: Normal contours without ductal dilatation. No peripancreatic fluid collection. Spleen: Unremarkable. Adrenals/Urinary Tract: --Adrenal glands: Unremarkable. --Right kidney/ureter: No hydronephrosis or radiopaque kidney stones. --Left kidney/ureter: No hydronephrosis or radiopaque kidney stones. --Urinary bladder: There is diffuse circumferential wall thickening of the urinary bladder. Stomach/Bowel: --Stomach/Duodenum: There appears to be esophageal wall thickening involving the partially visualized distal esophagus. --Small bowel: Unremarkable. --Colon: There are scattered colonic diverticula without CT evidence for  diverticulitis. --Appendix: Normal. Vascular/Lymphatic: Atherosclerotic calcification is present within the non-aneurysmal abdominal aorta, without hemodynamically significant stenosis. --No retroperitoneal lymphadenopathy. --No mesenteric lymphadenopathy. --No pelvic or inguinal lymphadenopathy. Reproductive: The prostate gland is enlarged. Other: No ascites or free air. The abdominal wall is normal. Musculoskeletal. No acute displaced fractures. IMPRESSION: 1. Probable wall thickening of the distal esophagus which can be seen in patients with esophagitis. 2. Circumferential wall thickening of the urinary bladder. Correlate with urinalysis to exclude cystitis. 3. Severe hepatic steatosis. 4. Trace bilateral pleural effusions with adjacent mild atelectasis. 5. Colonic diverticulosis without CT evidence for diverticulitis. 6. Enlarged prostate gland. Aortic Atherosclerosis (ICD10-I70.0). Electronically Signed   By: Katherine Mantle M.D.   On: 02/06/2020 00:44   CT Head Wo Contrast  Result Date: 02/05/2020 CLINICAL DATA:  Fall.  Losing balance over the last few days. EXAM: CT HEAD  WITHOUT CONTRAST TECHNIQUE: Contiguous axial images were obtained from the base of the skull through the vertex without intravenous contrast. COMPARISON:  None. FINDINGS: Brain: No subdural, epidural, or subarachnoid hemorrhage. Ventricles and sulci are stable. A lacunar infarct in the right internal capsule is more prominent interval remains small. Scattered white matter changes are otherwise unchanged. No acute cortical ischemia or infarct. No mass effect or midline shift. Cerebellum, brainstem, and basal cisterns are normal. Vascular: Calcified atherosclerosis in the intracranial carotids. Skull: Normal. Negative for fracture or focal lesion. Sinuses/Orbits: No acute finding. Other: None. IMPRESSION: Chronic white matter changes. Chronic right internal capsule lacunar infarct, more prominent the interval. No acute intracranial  abnormalities are noted. Electronically Signed   By: Gerome Samavid  Williams III M.D   On: 02/05/2020 20:34   DG Chest Portable 1 View  Result Date: 02/05/2020 CLINICAL DATA:  Confusion EXAM: PORTABLE CHEST 1 VIEW COMPARISON:  10/17/2019 FINDINGS: The heart size and mediastinal contours are within normal limits. Both lungs are clear. The visualized skeletal structures are unremarkable. IMPRESSION: No active disease. Electronically Signed   By: Deatra RobinsonKevin  Herman M.D.   On: 02/05/2020 22:37     Medications:   . 0.9 % NaCl with KCl 20 mEq / L 100 mL/hr at 02/07/20 1247   . lisinopril  10 mg Oral Daily  . potassium chloride  40 mEq Oral TID   acetaminophen **OR** acetaminophen, ondansetron **OR** ondansetron (ZOFRAN) IV  Assessment/ Plan:  Elijah Murphy is a 64 y.o.  male with medical problems of hypertension, hyperlipidemia, anxiety, depression, right eye blindness, prior history of CVA, who was admitted to [facility] on 02/05/2020 for evaluation of fall with 2-week history of progressive weakness, nausea, vomiting, diarrhea, and poor p.o. intake.Nephrology was consulted for the evaluation of hyponatremia and AKI. # Acute Kidney Injury  Baseline Creatinine around 1.0 Possibly dehydrated due to nausea,vomiting, diarrhea and poor oral intake Lab Results  Component Value Date   CREATININE 0.81 02/07/2020   CREATININE 0.87 02/06/2020   CREATININE 1.17 02/06/2020  Creatinine improved to baseline today Total urine output for the preceding 24 hours is 950 ml Continue IV hydration, Encourage PO intake  #Hyponatremia Possibly Hypovolemic  On presentation Na+ was 119  Improved to 129 today  #Hypokalemia Likely due to GI loss Potassium 2.8 today Patient is on IV Fluids  NS with 20 meq KCL 100 ml/hr  Also receiving oral Potassium supplements   LOS: 2 Mayetta Castleman 12/5/20211:54 PM

## 2020-02-07 NOTE — Progress Notes (Signed)
[name], MD [facility]   [address] [place], [place] [address] Phone: [phone] Fax : [phone]   Subjective: This patient was admitted yesterday with diarrhea and stool samples came back today for enteropathogenic E. coli. The patient has had 2 bowel movements today that he states were runny. The patient is being followed by nephrology for acute kidney injury. The patient had iron studies that showed normal iron with a high saturation and a ferritin over 1600. He was also noted to have pancytopenia with a drop in his white cell count platelets and hemoglobin.   Objective: Vital signs in last 24 hours: Vitals:   02/06/20 2320 02/06/20 2321 02/07/20 0519 02/07/20 0813  BP:  (!) 166/83 (!) 159/92 (!) 178/111  Pulse:  80 88 89  Resp:  18 17 15   Temp:  98 F (36.7 C) 98 F (36.7 C) 98.4 F (36.9 C)  TempSrc:  Oral Oral Oral  SpO2:  99% 98% 100%  Weight: 80.9 kg     Height: 5\' 9"  (1.753 m)      Weight change: -2.1 kg  Intake/Output Summary (Last 24 hours) at 02/07/2020 1444 Last data filed at 02/07/2020 14/07/2019 Gross per 24 hour  Intake 2141.32 ml  Output 1550 ml  Net 591.32 ml     Exam: Heart:: Regular rate and rhythm, S1S2 present or without murmur or extra heart sounds Lungs: normal, clear to auscultation and clear to auscultation and percussion Abdomen: soft, nontender, normal bowel sounds   Lab Results: @LABTEST2 @ Micro Results: Recent Results (from the past 240 hour(s))  Resp Panel by RT-PCR (Flu A&B, Covid) Nasopharyngeal Swab     Status: None   Collection Time: 02/05/20  8:39 PM   Specimen: Nasopharyngeal Swab; Nasopharyngeal(NP) swabs in vial transport medium  Result Value Ref Range Status   SARS Coronavirus 2 by RT PCR NEGATIVE NEGATIVE Final    Comment: (NOTE) SARS-CoV-2 target nucleic acids are NOT DETECTED.  The SARS-CoV-2 RNA is generally detectable in upper respiratory specimens during the acute phase of infection. The  lowest concentration of SARS-CoV-2 viral copies this assay can detect is 138 copies/mL. A negative result does not preclude SARS-Cov-2 infection and should not be used as the sole basis for treatment or other patient management decisions. A negative result may occur with  improper specimen collection/handling, submission of specimen other than nasopharyngeal swab, presence of viral mutation(s) within the areas targeted by this assay, and inadequate number of viral copies(<138 copies/mL). A negative result must be combined with clinical observations, patient history, and epidemiological information. The expected result is Negative.  Fact Sheet for Patients:  002.002.002.002  Fact Sheet for Healthcare Providers:   This test is no t yet approved or cleared by the 14/03/21 FDA and  has been authorized for detection and/or diagnosis of SARS-CoV-2 by FDA under an Emergency Use Authorization (EUA). This EUA will remain  in effect (meaning this test can be used) for the duration of the COVID-19 declaration under Section 564(b)(1) of the Act, 21 U.S.C.section 360bbb-3(b)(1), unless the authorization is terminated  or revoked sooner.       Influenza A by PCR NEGATIVE NEGATIVE Final   Influenza B by PCR NEGATIVE NEGATIVE Final    Comment: (NOTE) The Xpert Xpress SARS-CoV-2/FLU/RSV plus assay is intended as an aid in the diagnosis of influenza from Nasopharyngeal swab specimens and should not be used as a sole basis for treatment. Nasal washings and aspirates are unacceptable for Xpert Xpress SARS-CoV-2/FLU/RSV testing.  Fact Sheet for Patients: BloggerCourse.com  Fact Sheet for Healthcare Providers: SeriousBroker.it  This test is not yet approved or cleared by the [place] FDA and has been authorized for detection and/or diagnosis of SARS-CoV-2 by FDA under  an Emergency Use Authorization (EUA). This EUA will remain in effect (meaning this test can be used) for the duration of the COVID-19 declaration under Section 564(b)(1) of the Act, 21 U.S.C. section 360bbb-3(b)(1), unless the authorization is terminated or revoked.  Performed at [facility], [address] [place], [place] [address]   Culture, blood (single)     Status: None (Preliminary result)   Collection Time: 02/05/20  8:50 PM   Specimen: BLOOD  Result Value Ref Range Status   Specimen Description BLOOD LEFT FOREARM  Final   Special Requests   Final    BOTTLES DRAWN AEROBIC AND ANAEROBIC Blood Culture results may not be optimal due to an inadequate volume of blood received in culture bottles   Culture   Final    NO GROWTH 2 DAYS Performed at [facility], [address] [place], [place] [address]    Report Status PENDING  Incomplete  Gastrointestinal Panel by PCR , Stool     Status: Abnormal   Collection Time: 02/07/20  4:30 AM   Specimen: STOOL  Result Value Ref Range Status   Campylobacter species NOT DETECTED NOT DETECTED Final   Plesimonas shigelloides NOT DETECTED NOT DETECTED Final   Salmonella species NOT DETECTED NOT DETECTED Final   Yersinia enterocolitica NOT DETECTED NOT DETECTED Final   Vibrio species NOT DETECTED NOT DETECTED Final   Vibrio cholerae NOT DETECTED NOT DETECTED Final   Enteroaggregative E coli (EAEC) NOT DETECTED NOT DETECTED Final   Enteropathogenic E coli (EPEC) DETECTED (A) NOT DETECTED Final    Comment: RESULT CALLED TO, READ BACK BY AND VERIFIED WITH:  MARIA [facility] AT 7322 02/07/20 ZSDR    Enterotoxigenic E coli (ETEC) NOT DETECTED NOT DETECTED Final   Shiga like toxin producing E coli (STEC) NOT DETECTED NOT DETECTED Final   Shigella/Enteroinvasive E coli (EIEC) NOT DETECTED NOT DETECTED Final   Cryptosporidium NOT DETECTED NOT DETECTED Final   Cyclospora cayetanensis NOT DETECTED NOT DETECTED Final   Entamoeba  histolytica NOT DETECTED NOT DETECTED Final   Giardia lamblia NOT DETECTED NOT DETECTED Final   Adenovirus F40/41 NOT DETECTED NOT DETECTED Final   Astrovirus NOT DETECTED NOT DETECTED Final   Norovirus GI/GII NOT DETECTED NOT DETECTED Final   Rotavirus A NOT DETECTED NOT DETECTED Final   Sapovirus (I, II, IV, and V) NOT DETECTED NOT DETECTED Final    Comment: Performed at [facility], [address] [place], [place] [address]  C Difficile Quick Screen w PCR reflex     Status: None   Collection Time: 02/07/20  4:30 AM   Specimen: STOOL  Result Value Ref Range Status   C Diff antigen NEGATIVE NEGATIVE Final   C Diff toxin NEGATIVE NEGATIVE Final   C Diff interpretation No C. difficile detected.  Final    Comment: Performed at [facility], [address] [place], [place] [address]   Studies/Results: CT ABDOMEN PELVIS WO CONTRAST  Result Date: 02/06/2020 CLINICAL DATA:  Renal failure.  Hematemesis EXAM: CT ABDOMEN AND PELVIS WITHOUT CONTRAST TECHNIQUE: Multidetector CT imaging of the abdomen and pelvis was performed following the standard protocol without IV contrast. COMPARISON:  None. FINDINGS: Lower chest: There are trace bilateral pleural effusions with adjacent mild atelectasis.The heart  size is essentially normal. Hepatobiliary: There is severe hepatic steatosis. Normal gallbladder.There is no biliary ductal dilation. Pancreas: Normal contours without ductal dilatation. No peripancreatic fluid collection. Spleen: Unremarkable. Adrenals/Urinary Tract: --Adrenal glands: Unremarkable. --Right kidney/ureter: No hydronephrosis or radiopaque kidney stones. --Left kidney/ureter: No hydronephrosis or radiopaque kidney stones. --Urinary bladder: There is diffuse circumferential wall thickening of the urinary bladder. Stomach/Bowel: --Stomach/Duodenum: There appears to be esophageal wall thickening involving the partially visualized distal esophagus. --Small bowel: Unremarkable.  --Colon: There are scattered colonic diverticula without CT evidence for diverticulitis. --Appendix: Normal. Vascular/Lymphatic: Atherosclerotic calcification is present within the non-aneurysmal abdominal aorta, without hemodynamically significant stenosis. --No retroperitoneal lymphadenopathy. --No mesenteric lymphadenopathy. --No pelvic or inguinal lymphadenopathy. Reproductive: The prostate gland is enlarged. Other: No ascites or free air. The abdominal wall is normal. Musculoskeletal. No acute displaced fractures. IMPRESSION: 1. Probable wall thickening of the distal esophagus which can be seen in patients with esophagitis. 2. Circumferential wall thickening of the urinary bladder. Correlate with urinalysis to exclude cystitis. 3. Severe hepatic steatosis. 4. Trace bilateral pleural effusions with adjacent mild atelectasis. 5. Colonic diverticulosis without CT evidence for diverticulitis. 6. Enlarged prostate gland. Aortic Atherosclerosis (ICD10-I70.0). Electronically Signed   By: Katherine Mantle M.D.   On: 02/06/2020 00:44   CT Head Wo Contrast  Result Date: 02/05/2020 CLINICAL DATA:  Fall.  Losing balance over the last few days. EXAM: CT HEAD WITHOUT CONTRAST TECHNIQUE: Contiguous axial images were obtained from the base of the skull through the vertex without intravenous contrast. COMPARISON:  None. FINDINGS: Brain: No subdural, epidural, or subarachnoid hemorrhage. Ventricles and sulci are stable. A lacunar infarct in the right internal capsule is more prominent interval remains small. Scattered white matter changes are otherwise unchanged. No acute cortical ischemia or infarct. No mass effect or midline shift. Cerebellum, brainstem, and basal cisterns are normal. Vascular: Calcified atherosclerosis in the intracranial carotids. Skull: Normal. Negative for fracture or focal lesion. Sinuses/Orbits: No acute finding. Other: None. IMPRESSION: Chronic white matter changes. Chronic right internal capsule  lacunar infarct, more prominent the interval. No acute intracranial abnormalities are noted. Electronically Signed   By: Gerome Sam III M.D   On: 02/05/2020 20:34   DG Chest Portable 1 View  Result Date: 02/05/2020 CLINICAL DATA:  Confusion EXAM: PORTABLE CHEST 1 VIEW COMPARISON:  10/17/2019 FINDINGS: The heart size and mediastinal contours are within normal limits. Both lungs are clear. The visualized skeletal structures are unremarkable. IMPRESSION: No active disease. Electronically Signed   By: Deatra Robinson M.D.   On: 02/05/2020 22:37   Medications: I have reviewed the patient's current medications. Scheduled Meds: . lisinopril  10 mg Oral Daily  . potassium chloride  40 mEq Oral TID   Continuous Infusions: . 0.9 % NaCl with KCl 20 mEq / L 100 mL/hr at 02/07/20 1247   PRN Meds:.acetaminophen **OR** acetaminophen, ondansetron **OR** ondansetron (ZOFRAN) IV   Assessment: Principal Problem:   Severe sepsis (HCC) Active Problems:   Essential hypertension   Hyperlipidemia   AKI (acute kidney injury) (HCC)   Lactic acidosis   Hyponatremia   Hypokalemia   Hypomagnesemia   Acute metabolic encephalopathy   History of UTI    Plan: This patient was consulted for possible coffee-ground emesis. The patient had denied coffee-ground emesis but does report that he continues to have diarrhea. The patient has pancytopenia and enteropathogenic E. coli on his stool studies. The patient is now on antibiotics for that and does not need any GI intervention at this time. We  will consider further GI intervention if the patient should show any sign of GI bleeding. The patient has been explained the plan agrees with it.   LOS: 2 days   Sherlyn HayDarren Quintell Bonnin, FACG 02/07/2020, 2:44 PM Pager [phone] 7am-5pm  Check AMION for 5pm -7am coverage and on weekends

## 2020-02-07 NOTE — Progress Notes (Signed)
[facility]  - [place] [facility]   PATIENT NAME: Elijah Murphy    MR#:  [id number]  DATE OF BIRTH:  1955-10-06  SUBJECTIVE:  came in with significant diarrhea vomiting. Tolerating PO diet. Wife in the room. Very small amount of stools. No abdominal pain or fever. Feels somewhat weak. REVIEW OF SYSTEMS:   Review of Systems  Constitutional: Positive for malaise/fatigue. Negative for chills, fever and weight loss.  HENT: Negative for ear discharge, ear pain and nosebleeds.   Eyes: Negative for blurred vision, pain and discharge.  Respiratory: Negative for sputum production, shortness of breath, wheezing and stridor.   Cardiovascular: Negative for chest pain, palpitations, orthopnea and PND.  Gastrointestinal: Negative for abdominal pain, diarrhea, nausea and vomiting.  Genitourinary: Negative for frequency and urgency.  Musculoskeletal: Negative for back pain and joint pain.  Neurological: Positive for weakness. Negative for sensory change, speech change and focal weakness.  Psychiatric/Behavioral: Negative for depression and hallucinations. The patient is not nervous/anxious.      Tolerating Diet:yes Tolerating RJ:JOACZYS  DRUG ALLERGIES:  No Known Allergies  VITALS:  Blood pressure (!) 178/111, pulse 89, temperature 98.4 F (36.9 C), temperature source Oral, resp. rate 15, height 5\' 9"  (1.753 m), weight 80.9 kg, SpO2 100 %.  PHYSICAL EXAMINATION:   Physical Exam  GENERAL:  64 y.o.-year-old patient lying in the bed with no acute distress. Appears chronically ill and deconditioned. HEENT: Head atraumatic, normocephalic. Oropharynx and nasopharynx clear. Mouth breathing LUNGS: normal breath sounds bilaterally, no wheezing, rales, rhonchi. No use of accessory muscles of respiration.  CARDIOVASCULAR: S1, S2 normal. No murmurs, rubs, or gallops.  ABDOMEN: Soft, nontender, nondistended. Bowel sounds present. No organomegaly or mass.  EXTREMITIES: No cyanosis,  clubbing or edema b/l.    NEUROLOGIC: grossly nonfocal.  PSYCHIATRIC:  patient is alert and oriented times three  SKIN: No obvious rash, lesion, or ulcer.   LABORATORY PANEL:  CBC Recent Labs  Lab 02/07/20 0346  WBC 3.6*  HGB 10.6*  HCT 28.4*  PLT 89*    Chemistries  Recent Labs  Lab 02/05/20 1905 02/05/20 1905 02/06/20 0336 02/06/20 1617 02/07/20 0346  NA 119*   < > 121*   < > 129*  K 2.8*   < > 2.9*   < > 2.8*  CL 76*   < > 87*   < > 96*  CO2 13*   < > 17*   < > 22  GLUCOSE 163*   < > 178*   < > 104*  BUN 13   < > 17   < > 13  CREATININE 1.66*   < > 1.17   < > 0.81  CALCIUM 8.8*   < > 7.3*   < > 7.7*  MG 1.6*  --   --   --   --   AST  --   --  70*  74*  --   --   ALT  --   --  42  43  --   --   ALKPHOS  --   --  46  47  --   --   BILITOT  --   --  3.5*  3.7*  --   --    < > = values in this interval not displayed.   Cardiac Enzymes No results for input(s): TROPONINI in the last 168 hours. RADIOLOGY:  CT ABDOMEN PELVIS WO CONTRAST  Result Date: 02/06/2020 CLINICAL DATA:  Renal failure.  Hematemesis EXAM: CT  ABDOMEN AND PELVIS WITHOUT CONTRAST TECHNIQUE: Multidetector CT imaging of the abdomen and pelvis was performed following the standard protocol without IV contrast. COMPARISON:  None. FINDINGS: Lower chest: There are trace bilateral pleural effusions with adjacent mild atelectasis.The heart size is essentially normal. Hepatobiliary: There is severe hepatic steatosis. Normal gallbladder.There is no biliary ductal dilation. Pancreas: Normal contours without ductal dilatation. No peripancreatic fluid collection. Spleen: Unremarkable. Adrenals/Urinary Tract: --Adrenal glands: Unremarkable. --Right kidney/ureter: No hydronephrosis or radiopaque kidney stones. --Left kidney/ureter: No hydronephrosis or radiopaque kidney stones. --Urinary bladder: There is diffuse circumferential wall thickening of the urinary bladder. Stomach/Bowel: --Stomach/Duodenum: There appears to  be esophageal wall thickening involving the partially visualized distal esophagus. --Small bowel: Unremarkable. --Colon: There are scattered colonic diverticula without CT evidence for diverticulitis. --Appendix: Normal. Vascular/Lymphatic: Atherosclerotic calcification is present within the non-aneurysmal abdominal aorta, without hemodynamically significant stenosis. --No retroperitoneal lymphadenopathy. --No mesenteric lymphadenopathy. --No pelvic or inguinal lymphadenopathy. Reproductive: The prostate gland is enlarged. Other: No ascites or free air. The abdominal wall is normal. Musculoskeletal. No acute displaced fractures. IMPRESSION: 1. Probable wall thickening of the distal esophagus which can be seen in patients with esophagitis. 2. Circumferential wall thickening of the urinary bladder. Correlate with urinalysis to exclude cystitis. 3. Severe hepatic steatosis. 4. Trace bilateral pleural effusions with adjacent mild atelectasis. 5. Colonic diverticulosis without CT evidence for diverticulitis. 6. Enlarged prostate gland. Aortic Atherosclerosis (ICD10-I70.0). Electronically Signed   By: Katherine Mantle M.D.   On: 02/06/2020 00:44   CT Head Wo Contrast  Result Date: 02/05/2020 CLINICAL DATA:  Fall.  Losing balance over the last few days. EXAM: CT HEAD WITHOUT CONTRAST TECHNIQUE: Contiguous axial images were obtained from the base of the skull through the vertex without intravenous contrast. COMPARISON:  None. FINDINGS: Brain: No subdural, epidural, or subarachnoid hemorrhage. Ventricles and sulci are stable. A lacunar infarct in the right internal capsule is more prominent interval remains small. Scattered white matter changes are otherwise unchanged. No acute cortical ischemia or infarct. No mass effect or midline shift. Cerebellum, brainstem, and basal cisterns are normal. Vascular: Calcified atherosclerosis in the intracranial carotids. Skull: Normal. Negative for fracture or focal lesion.  Sinuses/Orbits: No acute finding. Other: None. IMPRESSION: Chronic white matter changes. Chronic right internal capsule lacunar infarct, more prominent the interval. No acute intracranial abnormalities are noted. Electronically Signed   By: Gerome Sam III M.D   On: 02/05/2020 20:34   DG Chest Portable 1 View  Result Date: 02/05/2020 CLINICAL DATA:  Confusion EXAM: PORTABLE CHEST 1 VIEW COMPARISON:  10/17/2019 FINDINGS: The heart size and mediastinal contours are within normal limits. Both lungs are clear. The visualized skeletal structures are unremarkable. IMPRESSION: No active disease. Electronically Signed   By: Deatra Robinson M.D.   On: 02/05/2020 22:37   ASSESSMENT AND PLAN:  ZAVION SLEIGHT is a 64 y.o. 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  Severe sepsis (HCC)POA--now improving Ecoli/EPEC Gastroenteritis -Patient with above symptoms, tachycardic, afebrile but with leukocytosis, lactic acid of 5.9>>2.7, AKI, acute encephalopathy, with history of UTIs, now with symptoms of gastroenteritis due to [facility] -received aggressive IV resuscitation -IV meropenem and vancomycin --now d/ced -Covid and flu negative.  Chest x-ray clear -blood culture remains negative -- stool for C.  diff negative -- continue supportive care  Acute gastroenteritis E. coli -Patient presents with vomiting and diarrhea, decreased oral intake but without abdominal pain or fever -IV hydration as above, IV antiemetics  Coffee-ground emesis -now resolved. Hemoglobin stable. -- G.I. input appreciated no indication for G.I. luminal evaluation at present  AKI (acute kidney injury) (HCC) Increased anion gap metabolic  acidosis--resolved Hypokalemia -Creatinine of 1.66, up from most recent baseline of 1.0, likely prerenal related to vomiting and renal hypoperfusion from sepsis -- pharmacy to replete potassium    Hyponatremia -Critically low sodium of 119, likely related to the aforementioned problems, likely hypovolemic -- appreciate nephrology consultation. -- Sodium of 129  Acute metabolic encephalopathy resolved -Related to GI losses, sepsis as well as hyponatremia  Hypomagnesemia -recheck magnesium today    Hypertension -Continue to monitor closely    Hyperlipidemia -Continue home meds lisinopril -- PRN hydralazine    Hypokalemia -IV repletion and monitor  Pancytopenia -- no old labs to compare to. -- Could be due to acute illness versus chronic alcohol use at home.  DVT prophylaxis: SCD Code Status: full code  Family Communication:  wife in the room Disposition Plan: Back to previous home environment Consults called: none  Status:At the time of admission, it appears that the appropriate admission status for this patient is INPATIENT.   Patient currently medically not stable to discharge. Repeating potassium and monitoring sodium. Anticipate discharge likely tomorrow        TOTAL TIME TAKING CARE OF THIS PATIENT: *25* minutes.  >50% time spent on counselling and coordination of care  Note: This dictation was prepared with Dragon dictation along with smaller phrase technology. Any transcriptional errors that result from this process are unintentional.  Enedina Finner M.D    [facility]   CC: Primary care physician; Christella Scheuermann ID: Madison Hickman, male   DOB: 1955/08/12, 64 y.o.   MRN: [id number]

## 2020-02-07 NOTE — Progress Notes (Signed)
PT Cancellation Note  Patient Details Name: Elijah Murphy MRN: [id number] DOB: 22-Jan-1956   Cancelled Treatment:    Reason Eval/Treat Not Completed: Medical issues which prohibited therapy;Other (comment).  After performing chart review, pt has potassium levels at critically low value (2.8 mmol/L).  Will re-attempt evaluation at another time/date.   Nolon Bussing, PT, DPT 02/07/20, 9:23 AM

## 2020-02-08 LAB — MAGNESIUM
Magnesium: 1.4 mg/dL — ABNORMAL LOW (ref 1.7–2.4)
Magnesium: 2.4 mg/dL (ref 1.7–2.4)

## 2020-02-08 LAB — POTASSIUM: Potassium: 3.2 mmol/L — ABNORMAL LOW (ref 3.5–5.1)

## 2020-02-08 LAB — BASIC METABOLIC PANEL
Anion gap: 10 (ref 5–15)
BUN: 8 mg/dL (ref 8–23)
CO2: 25 mmol/L (ref 22–32)
Calcium: 7.7 mg/dL — ABNORMAL LOW (ref 8.9–10.3)
Chloride: 96 mmol/L — ABNORMAL LOW (ref 98–111)
Creatinine, Ser: 0.55 mg/dL — ABNORMAL LOW (ref 0.61–1.24)
GFR, Estimated: >60 mL/min (ref >60)
Glucose, Bld: 114 mg/dL — ABNORMAL HIGH (ref 70–99)
Potassium: 3.3 mmol/L — ABNORMAL LOW (ref 3.5–5.1)
Sodium: 131 mmol/L — ABNORMAL LOW (ref 135–145)

## 2020-02-08 LAB — PATHOLOGIST SMEAR REVIEW

## 2020-02-08 LAB — HAPTOGLOBIN: Haptoglobin: 73 mg/dL (ref 32–363)

## 2020-02-08 MED ORDER — MAGNESIUM SULFATE 4 GM/100ML IV SOLN
4.0000 g | Freq: Once | INTRAVENOUS | Status: AC
  Administered 2020-02-08: 4 g via INTRAVENOUS
  Filled 2020-02-08: qty 100

## 2020-02-08 MED ORDER — POTASSIUM CHLORIDE IN NACL 40-0.9 MEQ/L-% IV SOLN
INTRAVENOUS | Status: DC
  Filled 2020-02-08: qty 1000

## 2020-02-08 MED ORDER — POTASSIUM CHLORIDE CRYS ER 20 MEQ PO TBCR
40.0000 meq | EXTENDED_RELEASE_TABLET | Freq: Once | ORAL | Status: AC
  Administered 2020-02-08: 40 meq via ORAL
  Filled 2020-02-08: qty 2

## 2020-02-08 MED ORDER — PANTOPRAZOLE SODIUM 40 MG PO TBEC: 40.0000 mg | DELAYED_RELEASE_TABLET | Freq: Two times a day (BID) | ORAL | Status: DC

## 2020-02-08 MED ORDER — POTASSIUM CHLORIDE ER 20 MEQ PO TBCR: 20.0000 meq | EXTENDED_RELEASE_TABLET | Freq: Two times a day (BID) | ORAL | 0 refills | Status: DC

## 2020-02-08 NOTE — Progress Notes (Signed)
[facility]  ROUNDING NOTE   Subjective:  Patient is a 64 y.o. male with a PMHx of hypertension, hyperlipidemia, anxiety, depression, right eye blindness, prior history of CVA, who was admitted to [facility] on 02/05/2020 for evaluation of fall with 2-week history of progressive weakness, nausea, vomiting, diarrhea, and poor p.o. intake.Nephrology was consulted for the evaluation of hyponatremia and AKI.  Update  Patient appears in no acute distress.He denies abdominal pain, nausea, vomiting or diarrhea. He reports consuming breakfast this morning with fair appetite.    Objective:  Vital signs in last 24 hours:  Temp:  [97.6 F (36.4 C)-98.5 F (36.9 C)] 97.6 F (36.4 C) (12/06 0807) Pulse Rate:  [76-95] 89 (12/06 0807) Resp:  [16-18] 16 (12/06 0807) BP: (151-173)/(88-105) 156/88 (12/06 0807) SpO2:  [98 %-100 %] 100 % (12/06 0807)  Weight change:  Filed Weights   02/05/20 2350 02/06/20 2320  Weight: 83 kg 80.9 kg    Intake/Output: I/O last 3 completed shifts: In: 2501.3 [P.O.:840; I.V.:1411.3; IV Piggyback:250] Out: 4450 [Urine:4450]   Intake/Output this shift:  No intake/output data recorded.  Physical Exam: General: Resting in bed, appears comfortable  Head:  Normocephalic,atraumatic  Eyes: Anicteric  Lungs:  Normal and symmetrical respiratory effort,lungs clear  Heart: S1S2, no rubs or gallops  Abdomen:  Soft, nontender, non distended  Extremities:  No peripheral edema.  Neurologic: Able to answer simple questions appropriately  Skin: No acute  Lesions or rashes    Basic Metabolic Panel: Recent Labs  Lab 02/05/20 1905 02/05/20 1905 02/06/20 0336 02/06/20 0336 02/06/20 1617 02/07/20 0346 02/08/20 0429 02/08/20 1414  NA 119*  --  121*  --  126* 129* 131*  --   K 2.8*   < > 2.9*  --  2.4* 2.8* 3.3* 3.2*  CL 76*  --  87*  --  93* 96* 96*  --   CO2 13*  --  17*  --  20* 22 25  --   GLUCOSE 163*  --  178*  --  110* 104* 114*  --   BUN 13  --  17  --   16 13 8   --   CREATININE 1.66*  --  1.17  --  0.87 0.81 0.55*  --   CALCIUM 8.8*   < > 7.3*   < > 7.7* 7.7* 7.7*  --   MG 1.6*  --   --   --   --  1.8 1.4* 2.4   < > = values in this interval not displayed.    Liver Function Tests: Recent Labs  Lab 02/06/20 0336  AST 70*  74*  ALT 42  43  ALKPHOS 46  47  BILITOT 3.5*  3.7*  PROT 5.0*  5.2*  ALBUMIN 3.1*  3.2*   Recent Labs  Lab 02/06/20 0336  LIPASE 45   No results for input(s): AMMONIA in the last 168 hours.  CBC: Recent Labs  Lab 02/05/20 1905 02/06/20 0336 02/07/20 0346  WBC 10.8* 3.6* 3.6*  NEUTROABS 7.9*  --   --   HGB 15.3 11.5* 10.6*  HCT RESULTS UNAVAILABLE DUE TO INTERFERING SUBSTANCE RESULTS UNAVAILABLE DUE TO INTERFERING SUBSTANCE 28.4*  MCV RESULTS UNAVAILABLE DUE TO INTERFERING SUBSTANCE RESULTS UNAVAILABLE DUE TO INTERFERING SUBSTANCE 96.6  PLT 191 PLATELET CLUMPS NOTED ON SMEAR, UNABLE TO ESTIMATE 89*    Cardiac Enzymes: No results for input(s): CKTOTAL, CKMB, CKMBINDEX, TROPONINI in the last 168 hours.  BNP: Invalid input(s): POCBNP  CBG: No results  for input(s): GLUCAP in the last 168 hours.  Microbiology: Results for orders placed or performed during the hospital encounter of 02/05/20  Resp Panel by RT-PCR (Flu A&B, Covid) Nasopharyngeal Swab     Status: None   Collection Time: 02/05/20  8:39 PM   Specimen: Nasopharyngeal Swab; Nasopharyngeal(NP) swabs in vial transport medium  Result Value Ref Range Status   SARS Coronavirus 2 by RT PCR NEGATIVE NEGATIVE Final    Comment: (NOTE) SARS-CoV-2 target nucleic acids are NOT DETECTED.  The SARS-CoV-2 RNA is generally detectable in upper respiratory specimens during the acute phase of infection. The lowest concentration of SARS-CoV-2 viral copies this assay can detect is 138 copies/mL. A negative result does not preclude SARS-Cov-2 infection and should not be used as the sole basis for treatment or other patient management decisions. A  negative result may occur with  improper specimen collection/handling, submission of specimen other than nasopharyngeal swab, presence of viral mutation(s) within the areas targeted by this assay, and inadequate number of viral copies(<138 copies/mL). A negative result must be combined with clinical observations, patient history, and epidemiological information. The expected result is Negative.  Fact Sheet for Patients:  BloggerCourse.com  Fact Sheet for Healthcare Providers:  SeriousBroker.it  This test is no t yet approved or cleared by the [place] FDA and  has been authorized for detection and/or diagnosis of SARS-CoV-2 by FDA under an Emergency Use Authorization (EUA). This EUA will remain  in effect (meaning this test can be used) for the duration of the COVID-19 declaration under Section 564(b)(1) of the Act, 21 U.S.C.section 360bbb-3(b)(1), unless the authorization is terminated  or revoked sooner.       Influenza A by PCR NEGATIVE NEGATIVE Final   Influenza B by PCR NEGATIVE NEGATIVE Final    Comment: (NOTE) The Xpert Xpress SARS-CoV-2/FLU/RSV plus assay is intended as an aid in the diagnosis of influenza from Nasopharyngeal swab specimens and should not be used as a sole basis for treatment. Nasal washings and aspirates are unacceptable for Xpert Xpress SARS-CoV-2/FLU/RSV testing.  Fact Sheet for Patients: BloggerCourse.com  Fact Sheet for Healthcare Providers: SeriousBroker.it  This test is not yet approved or cleared by the [place] FDA and has been authorized for detection and/or diagnosis of SARS-CoV-2 by FDA under an Emergency Use Authorization (EUA). This EUA will remain in effect (meaning this test can be used) for the duration of the COVID-19 declaration under Section 564(b)(1) of the Act, 21 U.S.C. section 360bbb-3(b)(1), unless the authorization  is terminated or revoked.  Performed at [facility], [address] [place], [place] [address]   Culture, blood (single)     Status: None (Preliminary result)   Collection Time: 02/05/20  8:50 PM   Specimen: BLOOD  Result Value Ref Range Status   Specimen Description BLOOD LEFT FOREARM  Final   Special Requests   Final    BOTTLES DRAWN AEROBIC AND ANAEROBIC Blood Culture results may not be optimal due to an inadequate volume of blood received in culture bottles   Culture   Final    NO GROWTH 3 DAYS Performed at [facility], [address] [place], [place] [address]    Report Status PENDING  Incomplete  Gastrointestinal Panel by PCR , Stool     Status: Abnormal   Collection Time: 02/07/20  4:30 AM   Specimen: STOOL  Result Value Ref Range Status   Campylobacter species NOT DETECTED NOT DETECTED Final   Plesimonas shigelloides NOT DETECTED NOT  DETECTED Final   Salmonella species NOT DETECTED NOT DETECTED Final   Yersinia enterocolitica NOT DETECTED NOT DETECTED Final   Vibrio species NOT DETECTED NOT DETECTED Final   Vibrio cholerae NOT DETECTED NOT DETECTED Final   Enteroaggregative E coli (EAEC) NOT DETECTED NOT DETECTED Final   Enteropathogenic E coli (EPEC) DETECTED (A) NOT DETECTED Final    Comment: RESULT CALLED TO, READ BACK BY AND VERIFIED WITH:  MARIA [facility] AT 7893 02/07/20 ZSDR    Enterotoxigenic E coli (ETEC) NOT DETECTED NOT DETECTED Final   Shiga like toxin producing E coli (STEC) NOT DETECTED NOT DETECTED Final   Shigella/Enteroinvasive E coli (EIEC) NOT DETECTED NOT DETECTED Final   Cryptosporidium NOT DETECTED NOT DETECTED Final   Cyclospora cayetanensis NOT DETECTED NOT DETECTED Final   Entamoeba histolytica NOT DETECTED NOT DETECTED Final   Giardia lamblia NOT DETECTED NOT DETECTED Final   Adenovirus F40/41 NOT DETECTED NOT DETECTED Final   Astrovirus NOT DETECTED NOT DETECTED Final   Norovirus GI/GII NOT DETECTED NOT DETECTED Final    Rotavirus A NOT DETECTED NOT DETECTED Final   Sapovirus (I, II, IV, and V) NOT DETECTED NOT DETECTED Final    Comment: Performed at [facility], [address] [place], [place] [address]  C Difficile Quick Screen w PCR reflex     Status: None   Collection Time: 02/07/20  4:30 AM   Specimen: STOOL  Result Value Ref Range Status   C Diff antigen NEGATIVE NEGATIVE Final   C Diff toxin NEGATIVE NEGATIVE Final   C Diff interpretation No C. difficile detected.  Final    Comment: Performed at [facility], [address] [place], [place] [address]    Coagulation Studies: Recent Labs    02/06/20 0336  LABPROT 14.5  INR 1.2    Urinalysis: Recent Labs    02/05/20 2310  COLORURINE YELLOW*  LABSPEC 1.014  PHURINE 6.0  GLUCOSEU NEGATIVE  HGBUR MODERATE*  BILIRUBINUR NEGATIVE  KETONESUR 20*  PROTEINUR 100*  NITRITE NEGATIVE  LEUKOCYTESUR NEGATIVE      Imaging: No results found.   Medications:    . lisinopril  10 mg Oral Daily  . pantoprazole  40 mg Oral BID AC   acetaminophen **OR** acetaminophen, hydrALAZINE, ondansetron **OR** ondansetron (ZOFRAN) IV  Assessment/ Plan:  Mr. JOSHUAH MINELLA is a 64 y.o.  male with medical problems of hypertension, hyperlipidemia, anxiety, depression, right eye blindness, prior history of CVA, who was admitted to [facility] on 02/05/2020 for evaluation of fall with 2-week history of progressive weakness, nausea, vomiting, diarrhea, and poor p.o. intake.Nephrology was consulted for the evaluation of hyponatremia and AKI. # Acute Kidney Injury  Baseline Creatinine around 1.0 Possibly dehydrated due to nausea,vomiting, diarrhea and poor oral intake Lab Results  Component Value Date   CREATININE 0.55 (L) 02/08/2020   CREATININE 0.81 02/07/2020   CREATININE 0.87 02/06/2020  Creatinine levels stays stable Urine output 3500 ml for the preceding 24 hours   #Hyponatremia Hypovolemic  Sodium levels progressively  improving,131 today  #Hypokalemia Likely due to GI loss Potassium levels better today, still remains low at 3.2  Will supplement with oral potassium chloride   LOS: 3 Jahmire Ruffins 12/6/20213:13 PM

## 2020-02-08 NOTE — TOC Initial Note (Addendum)
Transition of Care [facility]) - Initial/Assessment Note    Patient Details  Name: Elijah Murphy MRN: [id number] Date of Birth: 10-12-1955  [facility]) CM/SW Contact:    Elijah Chroman, LCSW Phone Number: 02/08/2020, 12:31 PM  Clinical Narrative:  CSW met with patient. Wife at bedside. CSW introduced role and explained that PT recommendations would be discussed. Patient and his wife are agreeable to home health. Explained likely difficulty in finding an agency to take his [place]. If unable to find an agency to accept, will move forward with outpatient PT. Provided list of outpatient therapy clinics. Wife said they thought they had a walker at home but they do not so will order one prior to discharge. Patient will have numerous family members at home to assist. Patient told wife he might not discharge today due to potassium. CSW sent secure chat to MD to clarify so information can be given to wife. No further concerns. CSW encouraged patient and his wife to contact CSW as needed. CSW will continue to follow patient and his wife for support and facilitate return home when stable.       1:50 pm: [facility], [facility] and [facility] are unable to accept. Elijah Murphy is reviewing. Left messages for [facility] and [facility].          2:24 pm: [facility] can accept patient for PT. Patient and his wife are aware and agreeable. Asked MD for home health order and DME order for a walker.  Expected Discharge Plan: [place] (or outpatient PT) Barriers to Discharge: Continued Medical Work up   Patient Goals and CMS Choice   CMS Medicare.gov Compare Post Acute Care list provided to:: Patient (Wife at bedside)    Expected Discharge Plan and Services Expected Discharge Plan: [place] (or outpatient PT)     Post Acute Care Choice: Home Health, Durable Medical Equipment Living arrangements for the past 2 months:  Single Family Home                                      Prior Living Arrangements/Services Living arrangements for the past 2 months: Single Family Home Lives with:: Spouse Patient language and need for interpreter reviewed:: Yes Do you feel safe going back to the place where you live?: Yes      Need for Family Participation in Patient Care: Yes (Comment) Care giver support system in place?: Yes (comment) Current home services: DME Criminal Activity/Legal Involvement Pertinent to Current Situation/Hospitalization: No - Comment as needed  Activities of Daily Living Home Assistive Devices/Equipment: None ADL Screening (condition at time of admission) Patient's cognitive ability adequate to safely complete daily activities?: Yes Is the patient deaf or have difficulty hearing?: No Does the patient have difficulty seeing, even when wearing glasses/contacts?: No Does the patient have difficulty concentrating, remembering, or making decisions?: No Patient able to express need for assistance with ADLs?: Yes Does the patient have difficulty dressing or bathing?: Yes Independently performs ADLs?: No Communication: Independent Dressing (OT): Needs assistance Is this a change from baseline?: Change from baseline, expected to last <3days Grooming: Needs assistance Is this a change from baseline?: Change from baseline, expected to last <3 days Feeding: Independent Bathing: Needs assistance Is this a change from baseline?: Change from baseline, expected to last <3 days Toileting: Needs assistance Is this a change from  baseline?: Change from baseline, expected to last <3 days In/Out Bed: Needs assistance Is this a change from baseline?: Change from baseline, expected to last <3 days Walks in Home: Independent Does the patient have difficulty walking or climbing stairs?: Yes Weakness of Legs: Both Weakness of Arms/Hands: None  Permission Sought/Granted Permission sought to share  information with : Facility Sport and exercise psychologist, Family Supports Permission granted to share information with : Yes, Verbal Permission Granted  Share Information with NAME: Elijah Murphy  Permission granted to share info w AGENCY: [place] granted to share info w Relationship: Spouse  Permission granted to share info w Contact Information: [phone]  Emotional Assessment Appearance:: Appears stated age Attitude/Demeanor/Rapport: Engaged, Gracious Affect (typically observed): Accepting, Appropriate, Calm, Pleasant Orientation: : Oriented to Self, Oriented to  Time, Oriented to Place, Oriented to Situation Alcohol / Substance Use: Not Applicable Psych Involvement: No (comment)  Admission diagnosis:  Lactic acidosis [E87.2] Hyponatremia [E87.1] Weakness [R53.1] Severe sepsis ([place]) [A41.9, R65.20] Patient Active Problem List   Diagnosis Date Noted  . E. coli infection   . Hematemesis with nausea   . Diarrhea   . SIRS (systemic inflammatory response syndrome) (HCC)   . Acute gastroenteritis   . Anemia   . Severe sepsis ([place]) 02/05/2020  . AKI (acute kidney injury) ([place]) 02/05/2020  . Lactic acidosis 02/05/2020  . Hyponatremia 02/05/2020  . Hypokalemia 02/05/2020  . Hypomagnesemia 02/05/2020  . Acute metabolic encephalopathy 62/37/6283  . History of UTI 02/05/2020  . Essential hypertension 08/10/2016  . Hyperlipidemia 08/10/2016   PCP:  Ellene Route Pharmacy:   [id number] [id number]: [phone]: [phone] [id number] [place] [id number]: [phone]: [phone]   Social Determinants of Health (SDOH) Interventions    Readmission Risk Interventions No flowsheet data found.

## 2020-02-08 NOTE — Discharge Summary (Signed)
[facility] [place] [facility]   PATIENT NAME: Elijah Murphy    MR#:  [id number]  DATE OF BIRTH:  Oct 11, 1955  DATE OF ADMISSION:  02/05/2020 ADMITTING PHYSICIAN: Elijah Highland, MD  DATE OF DISCHARGE: 02/08/2020  PRIMARY CARE PHYSICIAN: Elijah Murphy    ADMISSION DIAGNOSIS:  Lactic acidosis [E87.2] Hyponatremia [E87.1] Weakness [R53.1] Severe sepsis (HCC) [A41.9, R65.20]  DISCHARGE DIAGNOSIS:  Severe sepsis present on admission now resolved Acute E. coli gastroenteritis-- improving Hypokalemia/hypomagnesimia acute kidney injury improved SECONDARY DIAGNOSIS:   Past Medical History:  Diagnosis Date  . Hypertension     HOSPITAL COURSE:   Elijah Murphy a 64 y.o.malewith medical history significant forHTN, HLD, anxiety and depression, blindness right eye secondary to strokeand past history of UTIs who presents to the emergency room with a 2-week history of progressive weakness,and 1 week history of vomiting and diarrhea withdecreased oral intake,and confusion and lethargy.  Severe sepsis (HCC)POA--now improving Ecoli/EPEC Gastroenteritis -Patient with above symptoms, tachycardic, afebrile but with leukocytosis, lactic acid of 5.9>>2.7,AKI, acute encephalopathy, with history of UTIs, now with symptoms of gastroenteritis due to [facility] -received aggressive IV resuscitation -IV meropenem and vancomycin --now d/ced -Covid and flu negative. Chest x-ray clear -blood culture remains negative -- stool for C. diff negative -- continue supportive care  Acute gastroenteritis E. coli -Patient presents with vomitingand diarrhea, decreased oral intake butwithout abdominal pain or fever -IV hydration as above, IV antiemetics -- diarrhea improving and frequency.  Coffee-ground emesis -now resolved. Hemoglobin stable. -- G.I. input appreciated no indication for G.I. luminal evaluation at present  AKI (acute kidney injury)  (HCC) Increased anion gap metabolic acidosis--resolved Hypokalemia/hypomagnesimia -Creatinine of 1.66, up from most recent baseline of 1.0, likely prerenal related to vomiting and renal hypoperfusion from sepsis -- pharmacy to replete potassium and magnesium -- patient is eating food high in potassium. He will be prescribed 20 M EQ BID for seven days. Patient will get labs done this week prior to follow-up with [facility] [place]. This was discussed with wife on the phone.  Hyponatremia -Critically low sodium of 119, likely related to hypovolemia/dehydration -- appreciate nephrology consultation. -- Sodium of 131  Acute metabolic encephalopathy resolved -Related toGI losses,sepsis as well as hyponatremia  Hypertension -Continue home meds lisinopril -- PRN hydralazine  Pancytopenia -- no old labs to compare to. -- Could be due to acute illness versus chronic alcohol use at home. -- Defer to PCP for following up CBC as outpatient  Overall improving. Patient work with physical therapy home health PT recommended. Discussed discharge plan with wife she is in agreement.  DVT prophylaxis:SCD Code Status:full code Family Communication:wife in the room Disposition Plan:Back to previous home environment Consults called:none Status:At the time of admission, it appears that the appropriate admission status for this patient is INPATIENT.   d/c home today   CONSULTS OBTAINED:  Treatment Team:  Elijah Minium, MD Elijah Finner, MD  DRUG ALLERGIES:  No Known Allergies  DISCHARGE MEDICATIONS:   Allergies as of 02/08/2020   No Known Allergies     Medication List    TAKE these medications   lisinopril 10 MG tablet Commonly known as: ZESTRIL Take 10 mg by mouth daily.   Potassium Chloride ER 20 MEQ Tbcr Take 20 mEq by mouth 2 (two) times daily for 7 days.       If you experience worsening of your admission symptoms, develop shortness of breath, life  threatening emergency, suicidal or homicidal thoughts you must seek medical attention  immediately by calling 911 or calling your MD immediately  if symptoms less severe.  You Must read complete instructions/literature along with all the possible adverse reactions/side effects for all the Medicines you take and that have been prescribed to you. Take any new Medicines after you have completely understood and accept all the possible adverse reactions/side effects.   Please note  You were cared for by a hospitalist during your hospital stay. If you have any questions about your discharge medications or the care you received while you were in the hospital after you are discharged, you can call the unit and asked to speak with the hospitalist on call if the hospitalist that took care of you is not available. Once you are discharged, your primary care physician will handle any further medical issues. Please note that NO REFILLS for any discharge medications will be authorized once you are discharged, as it is imperative that you return to your primary care physician (or establish a relationship with a primary care physician if you do not have one) for your aftercare needs so that they can reassess your need for medications and monitor your lab values. Today   SUBJECTIVE   Overall improved. Had three bowel movement yesterday. One moment earlier. Decreasing in frequency. Wife in the room. Patient tolerating PO very well.  VITAL SIGNS:  Blood pressure (!) 156/88, pulse 89, temperature 97.6 F (36.4 C), temperature source Oral, resp. rate 16, height 5\' 9"  (1.753 m), weight 80.9 kg, SpO2 100 %.  I/O:    Intake/Output Summary (Last 24 hours) at 02/08/2020 1447 Last data filed at 02/08/2020 0453 Gross per 24 hour  Intake 360 ml  Output 2900 ml  Net -2540 ml    PHYSICAL EXAMINATION:  GENERAL:  64 y.o.-year-old patient lying in the bed with no acute distress.   HEENT: Head atraumatic, normocephalic.  Oropharynx and nasopharynx clear.  LUNGS: Normal breath sounds bilaterally, no wheezing, rales,rhonchi or crepitation. No use of accessory muscles of respiration.  CARDIOVASCULAR: S1, S2 normal. No murmurs, rubs, or gallops.  ABDOMEN: Soft, non-tender, non-distended. Bowel sounds present. No organomegaly or mass.  EXTREMITIES: No pedal edema, cyanosis, or clubbing.  NEUROLOGIC: Cranial nerves II through XII are intact. Muscle strength 5/5 in all extremities. Sensation intact. Gait not checked. weak PSYCHIATRIC: The patient is alert and oriented x 3.  SKIN: No obvious rash, lesion, or ulcer.   DATA REVIEW:   CBC  Recent Labs  Lab 02/07/20 0346  WBC 3.6*  HGB 10.6*  HCT 28.4*  PLT 89*    Chemistries  Recent Labs  Lab 02/06/20 0336 02/06/20 1617 02/08/20 0429 02/08/20 0429 02/08/20 1414  NA 121*   < > 131*  --   --   K 2.9*   < > 3.3*   < > 3.2*  CL 87*   < > 96*  --   --   CO2 17*   < > 25  --   --   GLUCOSE 178*   < > 114*  --   --   BUN 17   < > 8  --   --   CREATININE 1.17   < > 0.55*  --   --   CALCIUM 7.3*   < > 7.7*  --   --   MG  --    < > 1.4*   < > 2.4  AST 70*  74*  --   --   --   --   ALT 42  43  --   --   --   --   ALKPHOS 46  47  --   --   --   --   BILITOT 3.5*  3.7*  --   --   --   --    < > = values in this interval not displayed.    Microbiology Results   Recent Results (from the past 240 hour(s))  Resp Panel by RT-PCR (Flu A&B, Covid) Nasopharyngeal Swab     Status: None   Collection Time: 02/05/20  8:39 PM   Specimen: Nasopharyngeal Swab; Nasopharyngeal(NP) swabs in vial transport medium  Result Value Ref Range Status   SARS Coronavirus 2 by RT PCR NEGATIVE NEGATIVE Final    Comment: (NOTE) SARS-CoV-2 target nucleic acids are NOT DETECTED.  The SARS-CoV-2 RNA is generally detectable in upper respiratory specimens during the acute phase of infection. The lowest concentration of SARS-CoV-2 viral copies this assay can detect is 138  copies/mL. A negative result does not preclude SARS-Cov-2 infection and should not be used as the sole basis for treatment or other patient management decisions. A negative result may occur with  improper specimen collection/handling, submission of specimen other than nasopharyngeal swab, presence of viral mutation(s) within the areas targeted by this assay, and inadequate number of viral copies(<138 copies/mL). A negative result must be combined with clinical observations, patient history, and epidemiological information. The expected result is Negative.  Fact Sheet for Patients:  BloggerCourse.com  Fact Sheet for Healthcare Providers:  SeriousBroker.it  This test is no t yet approved or cleared by the [place] FDA and  has been authorized for detection and/or diagnosis of SARS-CoV-2 by FDA under an Emergency Use Authorization (EUA). This EUA will remain  in effect (meaning this test can be used) for the duration of the COVID-19 declaration under Section 564(b)(1) of the Act, 21 U.S.C.section 360bbb-3(b)(1), unless the authorization is terminated  or revoked sooner.       Influenza A by PCR NEGATIVE NEGATIVE Final   Influenza B by PCR NEGATIVE NEGATIVE Final    Comment: (NOTE) The Xpert Xpress SARS-CoV-2/FLU/RSV plus assay is intended as an aid in the diagnosis of influenza from Nasopharyngeal swab specimens and should not be used as a sole basis for treatment. Nasal washings and aspirates are unacceptable for Xpert Xpress SARS-CoV-2/FLU/RSV testing.  Fact Sheet for Patients: BloggerCourse.com  Fact Sheet for Healthcare Providers: SeriousBroker.it  This test is not yet approved or cleared by the [place] FDA and has been authorized for detection and/or diagnosis of SARS-CoV-2 by FDA under an Emergency Use Authorization (EUA). This EUA will remain in effect (meaning  this test can be used) for the duration of the COVID-19 declaration under Section 564(b)(1) of the Act, 21 U.S.C. section 360bbb-3(b)(1), unless the authorization is terminated or revoked.  Performed at [facility], [address] [place], [place] [address]   Culture, blood (single)     Status: None (Preliminary result)   Collection Time: 02/05/20  8:50 PM   Specimen: BLOOD  Result Value Ref Range Status   Specimen Description BLOOD LEFT FOREARM  Final   Special Requests   Final    BOTTLES DRAWN AEROBIC AND ANAEROBIC Blood Culture results may not be optimal due to an inadequate volume of blood received in culture bottles   Culture   Final    NO GROWTH 3 DAYS Performed at [facility], [address] [place], [place] [address]    Report Status  PENDING  Incomplete  Gastrointestinal Panel by PCR , Stool     Status: Abnormal   Collection Time: 02/07/20  4:30 AM   Specimen: STOOL  Result Value Ref Range Status   Campylobacter species NOT DETECTED NOT DETECTED Final   Plesimonas shigelloides NOT DETECTED NOT DETECTED Final   Salmonella species NOT DETECTED NOT DETECTED Final   Yersinia enterocolitica NOT DETECTED NOT DETECTED Final   Vibrio species NOT DETECTED NOT DETECTED Final   Vibrio cholerae NOT DETECTED NOT DETECTED Final   Enteroaggregative E coli (EAEC) NOT DETECTED NOT DETECTED Final   Enteropathogenic E coli (EPEC) DETECTED (A) NOT DETECTED Final    Comment: RESULT CALLED TO, READ BACK BY AND VERIFIED WITH:  MARIA [facility] AT 1829 02/07/20 ZSDR    Enterotoxigenic E coli (ETEC) NOT DETECTED NOT DETECTED Final   Shiga like toxin producing E coli (STEC) NOT DETECTED NOT DETECTED Final   Shigella/Enteroinvasive E coli (EIEC) NOT DETECTED NOT DETECTED Final   Cryptosporidium NOT DETECTED NOT DETECTED Final   Cyclospora cayetanensis NOT DETECTED NOT DETECTED Final   Entamoeba histolytica NOT DETECTED NOT DETECTED Final   Giardia lamblia NOT DETECTED NOT  DETECTED Final   Adenovirus F40/41 NOT DETECTED NOT DETECTED Final   Astrovirus NOT DETECTED NOT DETECTED Final   Norovirus GI/GII NOT DETECTED NOT DETECTED Final   Rotavirus A NOT DETECTED NOT DETECTED Final   Sapovirus (I, II, IV, and V) NOT DETECTED NOT DETECTED Final    Comment: Performed at [facility], [address] [place], [place] [address]  C Difficile Quick Screen w PCR reflex     Status: None   Collection Time: 02/07/20  4:30 AM   Specimen: STOOL  Result Value Ref Range Status   C Diff antigen NEGATIVE NEGATIVE Final   C Diff toxin NEGATIVE NEGATIVE Final   C Diff interpretation No C. difficile detected.  Final    Comment: Performed at [facility], [address] [place], [place] [address]    RADIOLOGY:  No results found.   CODE STATUS:     Code Status Orders  (From admission, onward)         Start     Ordered   02/05/20 2237  Full code  Continuous        02/05/20 2241        Code Status History    This patient has a current code status but no historical code status.   Advance Care Planning Activity       TOTAL TIME TAKING CARE OF THIS PATIENT: *35 minutes.    Elijah Murphy M.D  [facility]    CC: Primary care physician; Elijah Murphy

## 2020-02-08 NOTE — Evaluation (Signed)
Physical Therapy Evaluation Patient Details Name: Elijah Murphy MRN: [id number] DOB: 07-18-1955 Today's Date: 02/08/2020   History of Present Illness  64 y.o. 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  Clinical Impression  Pt showed good effort with PT exam and was eager to get up as he has been in bed for a few days.  He was able to rise to EOB and to standing w/o a lot of assist, but he required RW (normally uses cane or nothing) and even though he was able to ambulate ~100 ft with walker, but had multiple LOBs (typically backward) needing assist to maintain upright and also fatigued much quicker than his baseline.  Pt will need HHPT at discharge, discussed with wife staying close every time he is up (intially) and to insure he is using walker until he has worked with HHPT enough to transition safely to cane, etc.    Follow Up Recommendations Home health PT    Equipment Recommendations  None recommended by PT (pt has BSC, RW, SPCs)    Recommendations for Other Services       Precautions / Restrictions Precautions Precautions: Fall Restrictions Weight Bearing Restrictions: No      Mobility  Bed Mobility Overal bed mobility: Modified Independent             General bed mobility comments: slow but able to get to EOB w/o assist    Transfers Overall transfer level: Modified independent Equipment used: Rolling walker (2 wheeled)             General transfer comment: Cuing for sequencing and set up, able to rise w/o direct phyiscal assist  Ambulation/Gait Ambulation/Gait assistance: Min assist Gait Distance (Feet): 100 Feet Assistive device: Rolling walker (2 wheeled)       General Gait  Details: multiple loops in the room with walker.  Pt had 2 overt LOBs (backward) that did require assist to arrest fall.  He did need consistent cuing to insure keeping weight forward on the walker/appropriate use.  He showed good effort and vitals stayed relatively stable.    Stairs            Wheelchair Mobility    Modified Rankin (Stroke Patients Only)       Balance Overall balance assessment: Needs assistance   Sitting balance-Leahy Scale: Good       Standing balance-Leahy Scale: Poor Standing balance comment: pt had multiple small LOBs during in-room ambulation, needed assist to maintain upright                              Pertinent Vitals/Pain      Home Living Family/patient expects to be discharged to:: Private residence Living Arrangements: Spouse/significant other Available Help at Discharge: Family;Available 24 hours/day Type of Home: House Home Access: Stairs to enter Entrance Stairs-Rails: Can reach both Entrance Stairs-Number of Steps: 5 Home Layout: One level Home Equipment: Walker - 2 wheels;Cane - single point      Prior Function Level of Independence: Independent         Comments: reports he would sometimes use cane, normally not out of the home a lot but able to do ADLs, etc  Hand Dominance        Extremity/Trunk Assessment   Upper Extremity Assessment Upper Extremity Assessment: Overall WFL for tasks assessed;Generalized weakness    Lower Extremity Assessment Lower Extremity Assessment: Overall WFL for tasks assessed;Generalized weakness       Communication   Communication: No difficulties  Cognition Arousal/Alertness: Awake/alert Behavior During Therapy: WFL for tasks assessed/performed Overall Cognitive Status: Within Functional Limits for tasks assessed                                        General Comments General comments (skin integrity, edema, etc.): Pt far from his baseline, but was  able to do ~100 ft of ambulation (though he needed RW and had multiple LOBs)    Exercises     Assessment/Plan    PT Assessment Patient needs continued PT services  PT Problem List Decreased strength;Decreased range of motion;Decreased activity tolerance;Decreased balance;Decreased mobility;Decreased knowledge of use of DME;Decreased safety awareness       PT Treatment Interventions DME instruction;Gait training;Stair training;Functional mobility training;Therapeutic activities;Therapeutic exercise;Balance training;Neuromuscular re-education;Patient/family education    PT Goals (Current goals can be found in the Care Plan section)  Acute Rehab PT Goals Patient Stated Goal: go home PT Goal Formulation: With patient/family Time For Goal Achievement: 02/22/20 Potential to Achieve Goals: Good    Frequency Min 2X/week   Barriers to discharge        Co-evaluation               AM-PAC PT "6 Clicks" Mobility  Outcome Measure Help needed turning from your back to your side while in a flat bed without using bedrails?: None Help needed moving from lying on your back to sitting on the side of a flat bed without using bedrails?: None Help needed moving to and from a bed to a chair (including a wheelchair)?: A Little Help needed standing up from a chair using your arms (e.g., wheelchair or bedside chair)?: A Little Help needed to walk in hospital room?: A Little Help needed climbing 3-5 steps with a railing? : A Lot 6 Click Score: 19    End of Session Equipment Utilized During Treatment: Gait belt Activity Tolerance: Patient limited by fatigue Patient left: with chair alarm set;with call bell/phone within reach;with family/visitor present Nurse Communication: Mobility status PT Visit Diagnosis: Muscle weakness (generalized) (M62.81);Difficulty in walking, not elsewhere classified (R26.2)    Time: 9833-8250 PT Time Calculation (min) (ACUTE ONLY): 27 min   Charges:   PT  Evaluation $PT Eval Low Complexity: 1 Low PT Treatments $Gait Training: 8-22 mins        Malachi Pro, DPT 02/08/2020, 11:17 AM

## 2020-02-08 NOTE — Progress Notes (Signed)
PHARMACY CONSULT NOTE  Pharmacy Consult for Electrolyte Monitoring and Replacement   Recent Labs: Potassium (mmol/L)  Date Value  02/08/2020 3.3 (L)  04/23/2013 3.6   Magnesium (mg/dL)  Date Value  94/17/4081 1.4 (L)   Calcium (mg/dL)  Date Value  44/81/8563 7.7 (L)   Calcium, Total (mg/dL)  Date Value  14/97/0263 8.2 (L)   Albumin (g/dL)  Date Value  78/58/8502 3.2 (L)  02/06/2020 3.1 (L)  04/23/2013 4.1   Sodium (mmol/L)  Date Value  02/08/2020 131 (L)  04/23/2013 128 (L)   Corrected Ca: 8.3 mg/dL  Assessment: 64 y.o. 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  Pharmacy to manage electrolytes.  Goal of Therapy:  Electrolytes WNL  Plan:   4 grams IV magnesium sulfate x 1  Oral KCl 40 mEq x 1  recheck electrolytes with morning labs.  Lowella Bandy ,PharmD Clinical Pharmacist 02/08/2020 7:13 AM

## 2020-02-08 NOTE — TOC Transition Note (Signed)
Transition of Care [facility]) - CM/SW Discharge Note   Patient Details  Name: Elijah Murphy MRN: [id number] Date of Birth: 12-21-1955  [facility] CM/SW Contact:  Margarito Liner, LCSW Phone Number: 02/08/2020, 3:06 PM   Clinical Narrative: Patient has orders to discharge home today. [facility] representative is aware. CSW called [employer] representative and ordered a walker. No further concerns. CSW signing off.    Final next level of care: Home w Home Health Services Barriers to Discharge: Barriers Resolved   Patient Goals and CMS Choice   CMS Medicare.gov Compare Post Acute Care list provided to:: Patient (Wife at bedside) Choice offered to / list presented to : Patient, Adult Children  Discharge Placement                  Name of family member notified: Bettie Swavely Patient and family notified of of transfer: 02/08/20  Discharge Plan and Services     Post Acute Care Choice: Home Health, Durable Medical Equipment          DME Arranged: Walker rolling DME Agency: [employer] Date DME Agency Contacted: 02/08/20   Representative spoke with at DME Agency: [facility] Arranged: [facility] Agency: [facility] Date [facility] Agency Contacted: 02/08/20   Representative spoke with at [facility] Agency: Adriane  Social Determinants of Health (SDOH) Interventions     Readmission Risk Interventions No flowsheet data found.

## 2020-02-10 LAB — CULTURE, BLOOD (SINGLE): Culture: NO GROWTH

## 2020-11-21 ENCOUNTER — Other Ambulatory Visit: Payer: Self-pay

## 2020-11-21 DIAGNOSIS — R300 Dysuria: Secondary | ICD-10-CM | POA: Diagnosis present

## 2020-11-21 DIAGNOSIS — F17220 Nicotine dependence, chewing tobacco, uncomplicated: Secondary | ICD-10-CM | POA: Diagnosis not present

## 2020-11-21 DIAGNOSIS — Z79899 Other long term (current) drug therapy: Secondary | ICD-10-CM | POA: Insufficient documentation

## 2020-11-21 DIAGNOSIS — E876 Hypokalemia: Secondary | ICD-10-CM | POA: Diagnosis not present

## 2020-11-21 DIAGNOSIS — B9620 Unspecified Escherichia coli [E. coli] as the cause of diseases classified elsewhere: Secondary | ICD-10-CM | POA: Insufficient documentation

## 2020-11-21 DIAGNOSIS — N3001 Acute cystitis with hematuria: Secondary | ICD-10-CM | POA: Insufficient documentation

## 2020-11-21 DIAGNOSIS — I10 Essential (primary) hypertension: Secondary | ICD-10-CM | POA: Diagnosis not present

## 2020-11-21 LAB — CBC
HCT: 35.6 % — ABNORMAL LOW (ref 39.0–52.0)
Hemoglobin: 12.8 g/dL — ABNORMAL LOW (ref 13.0–17.0)
MCH: 35.5 pg — ABNORMAL HIGH (ref 26.0–34.0)
MCHC: 36 g/dL (ref 30.0–36.0)
MCV: 98.6 fL (ref 80.0–100.0)
Platelets: 127 10*3/uL — ABNORMAL LOW (ref 150–400)
RBC: 3.61 MIL/uL — ABNORMAL LOW (ref 4.22–5.81)
RDW: 13 % (ref 11.5–15.5)
WBC: 5.8 10*3/uL (ref 4.0–10.5)
nRBC: 0 % (ref 0.0–0.2)

## 2020-11-21 LAB — BASIC METABOLIC PANEL
Anion gap: 18 — ABNORMAL HIGH (ref 5–15)
BUN: 10 mg/dL (ref 8–23)
CO2: 22 mmol/L (ref 22–32)
Calcium: 7 mg/dL — ABNORMAL LOW (ref 8.9–10.3)
Chloride: 88 mmol/L — ABNORMAL LOW (ref 98–111)
Creatinine, Ser: 0.54 mg/dL — ABNORMAL LOW (ref 0.61–1.24)
GFR, Estimated: >60 mL/min (ref >60)
Glucose, Bld: 115 mg/dL — ABNORMAL HIGH (ref 70–99)
Potassium: 2.8 mmol/L — ABNORMAL LOW (ref 3.5–5.1)
Sodium: 128 mmol/L — ABNORMAL LOW (ref 135–145)

## 2020-11-21 LAB — URINALYSIS, COMPLETE (UACMP) WITH MICROSCOPIC
Bilirubin Urine: NEGATIVE
Glucose, UA: NEGATIVE mg/dL
Ketones, ur: 40 mg/dL — AB
Nitrite: NEGATIVE
Protein, ur: >300 mg/dL — AB
RBC / HPF: NONE SEEN RBC/hpf (ref 0–5)
Specific Gravity, Urine: 1.015 (ref 1.005–1.030)
pH: 7 (ref 5.0–8.0)

## 2020-11-21 MED ORDER — POTASSIUM CHLORIDE CRYS ER 20 MEQ PO TBCR
40.0000 meq | EXTENDED_RELEASE_TABLET | Freq: Once | ORAL | Status: AC
  Administered 2020-11-22: 40 meq via ORAL
  Filled 2020-11-21: qty 2

## 2020-11-21 NOTE — ED Triage Notes (Signed)
Pt to ED from [facility] for possible UTI, pt was unable to give urine sample at clinic. Wife reports has not ate today, odor to urine.  Pt reports burning with urination

## 2020-11-22 ENCOUNTER — Emergency Department
Admission: EM | Admit: 2020-11-22 | Discharge: 2020-11-22 | Disposition: A | Discharge status: Home / Self Care | Payer: Medicare HMO | Attending: Emergency Medicine | Admitting: Emergency Medicine

## 2020-11-22 DIAGNOSIS — N3001 Acute cystitis with hematuria: Secondary | ICD-10-CM

## 2020-11-22 DIAGNOSIS — E876 Hypokalemia: Secondary | ICD-10-CM

## 2020-11-22 LAB — MAGNESIUM: Magnesium: 1 mg/dL — ABNORMAL LOW (ref 1.7–2.4)

## 2020-11-22 MED ORDER — CEPHALEXIN 500 MG PO CAPS: 500.0000 mg | ORAL_CAPSULE | Freq: Three times a day (TID) | ORAL | 0 refills | Status: AC

## 2020-11-22 MED ORDER — MAGNESIUM SULFATE 2 GM/50ML IV SOLN
2.0000 g | Freq: Once | INTRAVENOUS | Status: AC
  Administered 2020-11-22: 2 g via INTRAVENOUS
  Filled 2020-11-22: qty 50

## 2020-11-22 MED ORDER — SODIUM CHLORIDE 0.9 % IV SOLN
1.0000 g | Freq: Once | INTRAVENOUS | Status: AC
  Administered 2020-11-22: 1 g via INTRAVENOUS
  Filled 2020-11-22: qty 10

## 2020-11-22 MED ORDER — POTASSIUM CHLORIDE IN NACL 20-0.9 MEQ/L-% IV SOLN
Freq: Once | INTRAVENOUS | Status: AC
  Filled 2020-11-22: qty 1000

## 2020-11-22 NOTE — Discharge Instructions (Signed)
Follow-up with your doctor in 2 days for repeat blood work for your electrolyte abnormalities.  Take Keflex as prescribed for your UTI.  Return to the emergency room for abdominal pain, fever, or flank pain.  Otherwise follow-up with your doctor.

## 2020-11-22 NOTE — ED Provider Notes (Addendum)
[facility] Emergency Department Provider Note  ____________________________________________  Time seen: Approximately 12:52 AM  I have reviewed the triage vital signs and the nursing notes.   HISTORY  Chief Complaint Dysuria   HPI Elijah Murphy is a 65 y.o. male with a history of prior UTIs, hypertension, hyponatremia who presents for evaluation of dysuria.  Patient has had 5 days of dysuria and foul-smelling urine.  Went to urgent care today but was unable to provide a urine sample therefore was sent to the ED for further evaluation.  No abdominal pain, no flank pain, no fever, no chills, no nausea or vomiting.  Wife is concerned because patient has been uroseptic in the past.   Past Medical History:  Diagnosis Date   Hypertension     Patient Active Problem List   Diagnosis Date Noted   E. coli infection    Hematemesis with nausea    Diarrhea    SIRS (systemic inflammatory response syndrome) (HCC)    Acute gastroenteritis    Anemia    Severe sepsis (HCC) 02/05/2020   AKI (acute kidney injury) (HCC) 02/05/2020   Lactic acidosis 02/05/2020   Hyponatremia 02/05/2020   Hypokalemia 02/05/2020   Hypomagnesemia 02/05/2020   Acute metabolic encephalopathy 02/05/2020   History of UTI 02/05/2020   Essential hypertension 08/10/2016   Hyperlipidemia 08/10/2016    Past Surgical History:  Procedure Laterality Date   APPENDECTOMY     KNEE SURGERY Left    TONSILLECTOMY      Prior to Admission medications   Medication Sig Start Date End Date Taking? Authorizing Provider  cephALEXin (KEFLEX) 500 MG capsule Take 1 capsule (500 mg total) by mouth 3 (three) times daily for 7 days. 11/22/20 11/29/20 Yes Jaegar Croft, [place], [place]  lisinopril (ZESTRIL) 10 MG tablet Take 10 mg by mouth daily.  06/08/19   [provider]  potassium chloride 20 MEQ TBCR Take 20 mEq by mouth 2 (two) times daily for 7 days. 02/08/20 02/15/20  Enedina Finner, MD     Allergies Patient has no known allergies.  No family history on file.  Social History Social History   Tobacco Use   Smoking status: Never   Smokeless tobacco: Current    Types: Chew  Vaping Use   Vaping Use: Never used  Substance Use Topics   Alcohol use: Yes    Comment: occasionally   Drug use: No    Review of Systems  Constitutional: Negative for fever. Eyes: Negative for visual changes. ENT: Negative for sore throat. Neck: No neck pain  Cardiovascular: Negative for chest pain. Respiratory: Negative for shortness of breath. Gastrointestinal: Negative for abdominal pain, vomiting or diarrhea. Genitourinary: + dysuria and foul smelling urine Musculoskeletal: Negative for back pain. Skin: Negative for rash. Neurological: Negative for headaches, weakness or numbness. Psych: No SI or HI  ____________________________________________   PHYSICAL EXAM:  VITAL SIGNS: ED Triage Vitals  Enc Vitals Group     BP 11/21/20 1617 (!) 164/110     Pulse Rate 11/21/20 1617 100     Resp 11/21/20 1617 20     Temp 11/21/20 1617 98.4 F (36.9 C)     Temp Source 11/21/20 1617 Oral     SpO2 11/21/20 1617 97 %     Weight 11/21/20 1618 160 lb (72.6 kg)     Height 11/21/20 1618 5\' 9"  (1.753 m)     Head Circumference --      Peak Flow --  Pain Score 11/21/20 1618 0     Pain Loc --      Pain Edu? --      Excl. in GC? --     Constitutional: Alert and oriented. Well appearing and in no apparent distress. HEENT:      Head: Normocephalic and atraumatic.         Eyes: Conjunctivae are normal. Sclera is non-icteric.       Mouth/Throat: Mucous membranes are moist.       Neck: Supple with no signs of meningismus. Cardiovascular: Regular rate and rhythm. No murmurs, gallops, or rubs. 2+ symmetrical distal pulses are present in all extremities. No JVD. Respiratory: Normal respiratory effort. Lungs are clear to auscultation bilaterally.  Gastrointestinal: Soft, non tender, and  non distended with positive bowel sounds. No rebound or guarding. Genitourinary: No CVA tenderness. Musculoskeletal:  No edema, cyanosis, or erythema of extremities. Neurologic: Normal speech and language. Face is symmetric. Moving all extremities. No gross focal neurologic deficits are appreciated. Skin: Skin is warm, dry and intact. No rash noted. Psychiatric: Mood and affect are normal. Speech and behavior are normal.  ____________________________________________   LABS (all labs ordered are listed, but only abnormal results are displayed)  Labs Reviewed  CBC - Abnormal; Notable for the following components:      Result Value   RBC 3.61 (*)    Hemoglobin 12.8 (*)    HCT 35.6 (*)    MCH 35.5 (*)    Platelets 127 (*)    All other components within normal limits  BASIC METABOLIC PANEL - Abnormal; Notable for the following components:   Sodium 128 (*)    Potassium 2.8 (*)    Chloride 88 (*)    Glucose, Bld 115 (*)    Creatinine, Ser 0.54 (*)    Calcium 7.0 (*)    Anion gap 18 (*)    All other components within normal limits  URINALYSIS, COMPLETE (UACMP) WITH MICROSCOPIC - Abnormal; Notable for the following components:   Hgb urine dipstick SMALL (*)    Ketones, ur 40 (*)    Protein, ur >300 (*)    Leukocytes,Ua TRACE (*)    Bacteria, UA MANY (*)    All other components within normal limits  MAGNESIUM - Abnormal; Notable for the following components:   Magnesium 1.0 (*)    All other components within normal limits  URINE CULTURE   ____________________________________________  EKG  ED ECG REPORT I, Nita Sickle, the attending physician, personally viewed and interpreted this ECG.  Sinus tachycardia with PACs, normal intervals, no ST elevations or depressions. ____________________________________________  RADIOLOGY  none  ____________________________________________   PROCEDURES  Procedure(s) performed: yes .1-3 Lead EKG Interpretation Performed by:  Nita Sickle, MD Authorized by: Nita Sickle, MD     Interpretation: non-specific     ECG rate assessment: tachycardic     Rhythm: sinus tachycardia     Ectopy: PAC     Conduction: normal     Critical Care performed:  None ____________________________________________   INITIAL IMPRESSION / ASSESSMENT AND PLAN / ED COURSE  65 y.o. male with a history of prior UTIs, hypertension, hyponatremia who presents for evaluation of dysuria.  No systemic symptoms.  Afebrile.  No abdominal tenderness or flank tenderness.  UA is positive for UTI.  No leukocytosis.  Chemistry panel showing hyponatremia which is patient's baseline with mild hypokalemia, hypomagnesemia, and an anion gap.  Patient has been in the [facility] for 10 hours and has not had anything  to eat or drink.  He looks dry on exam.  We will give a liter bolus, 20 mEq of IV potassium and 40 mEq of p.o. potassium.  We will give IV magnesium.  We will get an EKG. Will give IV rocephin.  Urine sent for culture.  Doubt pyelonephritis or kidney stones with no abdominal or flank pain.  No signs of sepsis.  History gathered from patient and his wife was at bedside.  Plan discussed with both of them.  Old medical records reviewed  _________________________ 3:51 AM on 11/22/2020 ----------------------------------------- EKG with normal intervals.  Patient received IV magnesium, potassium, Rocephin.  Will discharge home on Keflex and follow-up with primary care doctor.  Discussed my standard return precautions with both patient and his wife.      _____________________________________________ Please note:  Patient was evaluated in Emergency Department today for the symptoms described in the history of present illness. Patient was evaluated in the context of the global COVID-19 pandemic, which necessitated consideration that the patient might be at risk for infection with the SARS-CoV-2 virus that causes COVID-19. Institutional protocols and  algorithms that pertain to the evaluation of patients at risk for COVID-19 are in a state of rapid change based on information released by regulatory bodies including the CDC and federal and state organizations. These policies and algorithms were followed during the patient's care in the ED.  Some ED evaluations and interventions may be delayed as a result of limited staffing during the pandemic.   [place] Controlled Substance Database was reviewed by me. ____________________________________________   FINAL CLINICAL IMPRESSION(S) / ED DIAGNOSES   Final diagnoses:  Acute cystitis with hematuria  Hypokalemia  Hypomagnesemia      NEW MEDICATIONS STARTED DURING THIS VISIT:  ED Discharge Orders          Ordered    cephALEXin (KEFLEX) 500 MG capsule  3 times daily        11/22/20 0334             Note:  This document was prepared using Dragon voice recognition software and may include unintentional dictation errors.    Don Perking, [place], MD 11/22/20 3428    Nita Sickle, MD 11/22/20 [phone]

## 2020-11-24 LAB — URINE CULTURE: Culture: >=100000 — AB

## 2021-06-10 IMAGING — CT CT ABD-PELV W/O CM
2 of 4 series · 15 of 46 positions shown, 17 images · non-contrast
Comparison: None.

CLINICAL DATA: Renal failure.  Hematemesis

EXAM:
CT ABDOMEN AND PELVIS WITHOUT CONTRAST
TECHNIQUE: Multidetector CT imaging of the abdomen and pelvis was performed
following the standard protocol without IV contrast.

[Series 2: routine abd/pel wo · axial · 0.75mm/px · z∈[-1429,-949]mm · 12 of 106 slices shown, 14 images]
[im 5/106  soft-tissue]
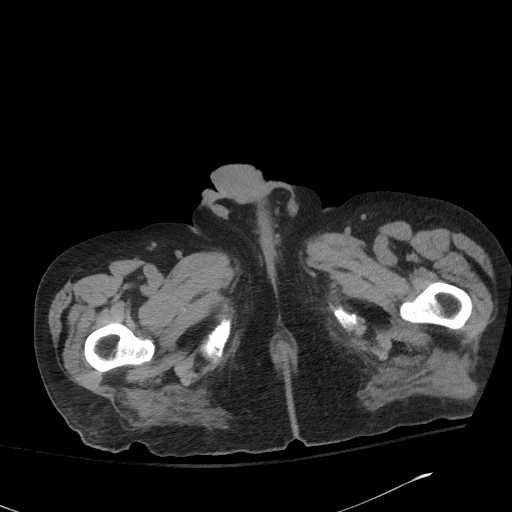
[im 5/106  bone]
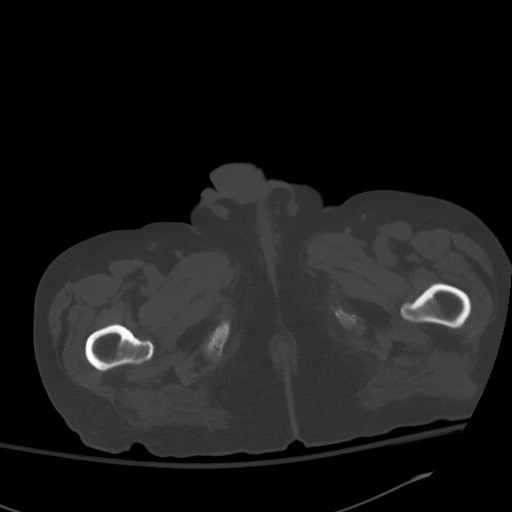
[im 14/106  soft-tissue]
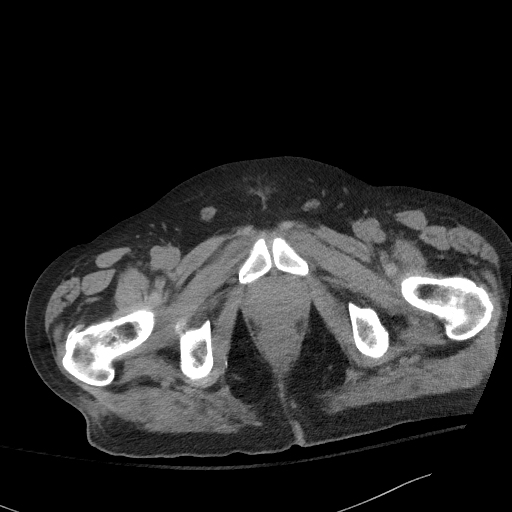
[im 23/106  soft-tissue]
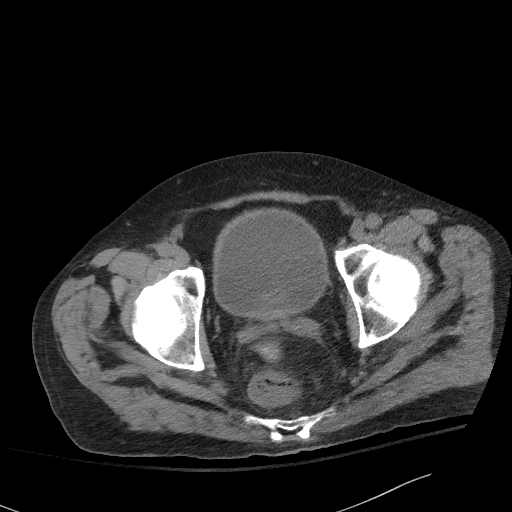
[im 32/106  soft-tissue]
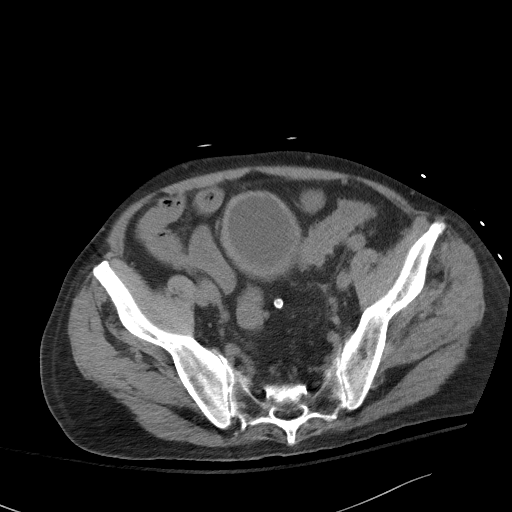
[im 42/106  soft-tissue]
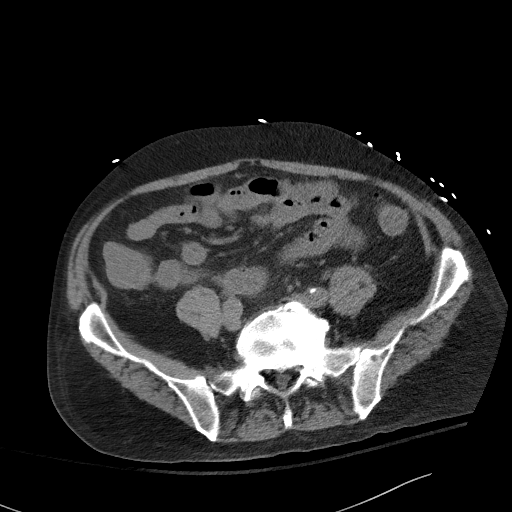
[im 51/106  soft-tissue]
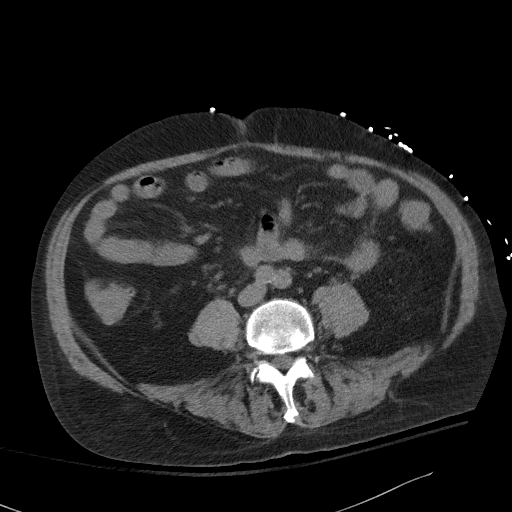
[im 55/106  soft-tissue]
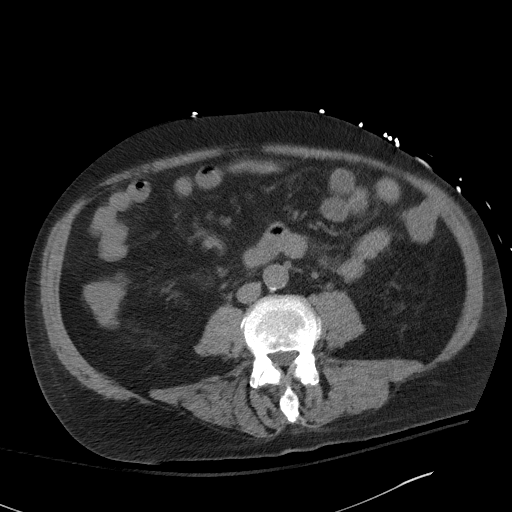
[im 64/106  soft-tissue]
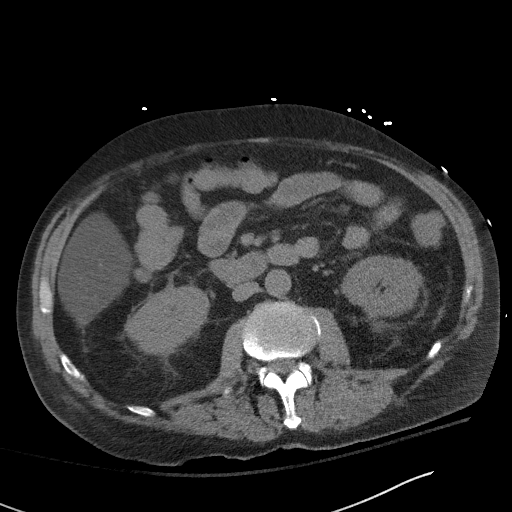
[im 74/106  soft-tissue]
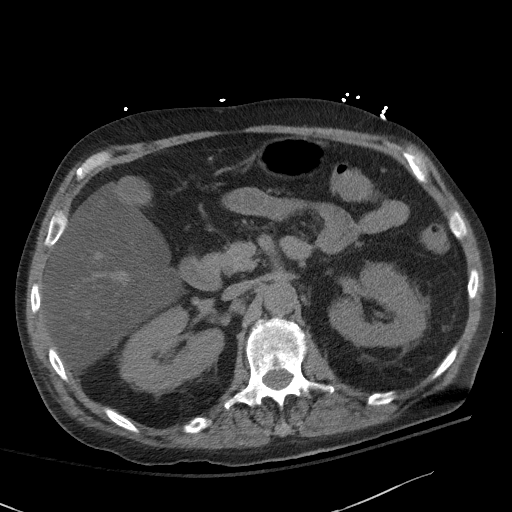
[im 74/106  bone]
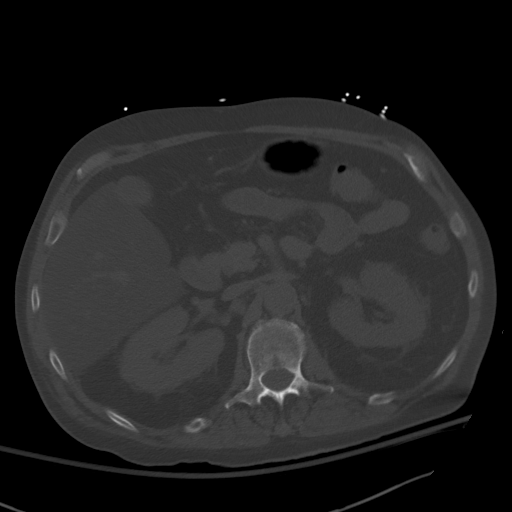
[im 83/106  soft-tissue]
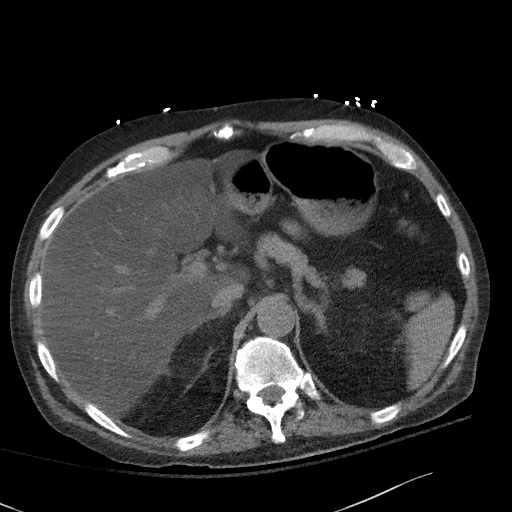
[im 92/106  soft-tissue]
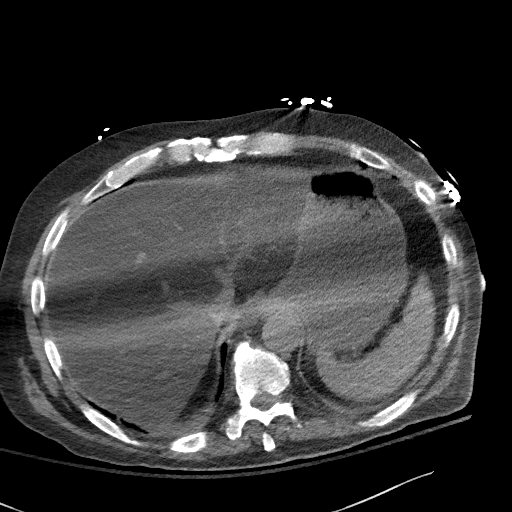
[im 101/106  soft-tissue]
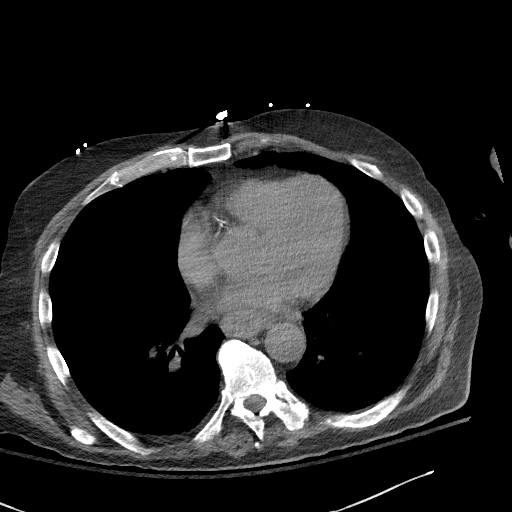

[Series 5: coronal st · coronal · 0.71mm/px · 3 of 92 slices shown]
[im 31/92  soft-tissue]
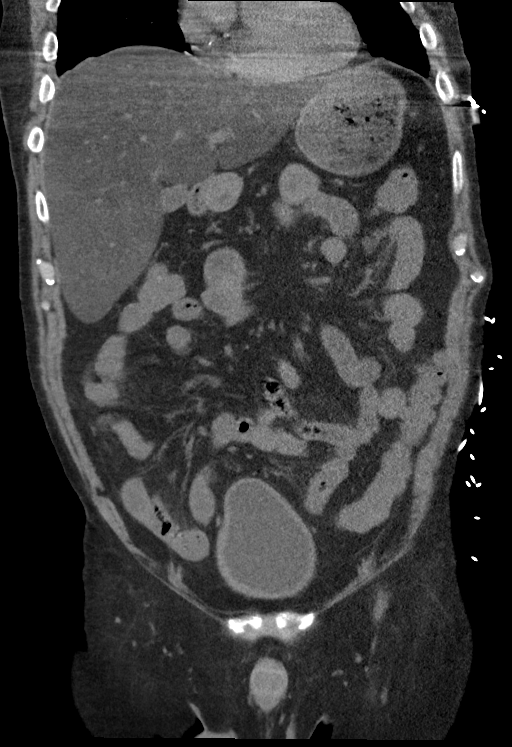
[im 41/92  soft-tissue]
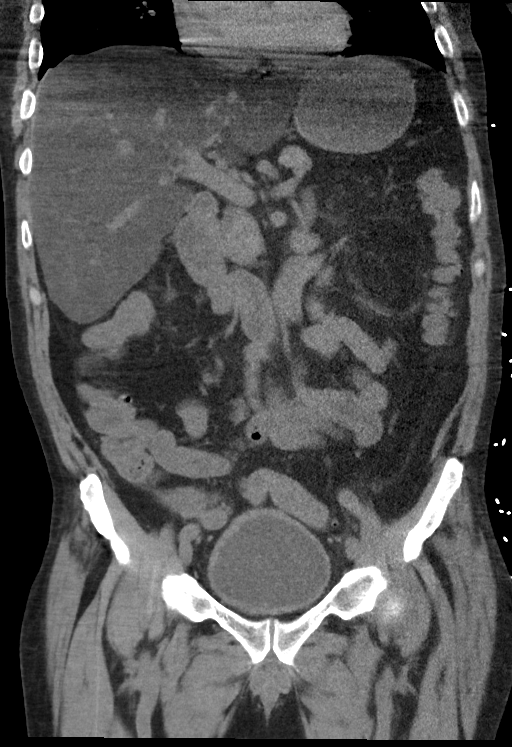
[im 51/92  soft-tissue]
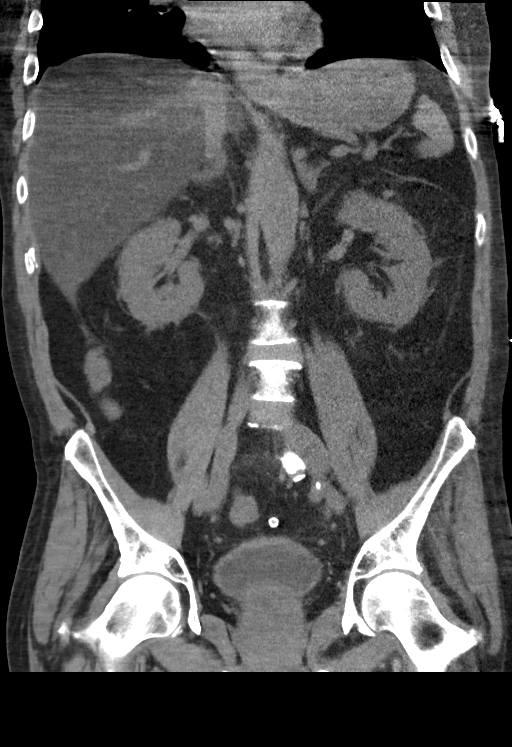

[15 of 46 positions shown; findings below may reference images not displayed]

FINDINGS: Lower chest: There are trace bilateral pleural effusions with
adjacent mild atelectasis.The heart size is essentially normal.

Hepatobiliary: There is severe hepatic steatosis. Normal
gallbladder.There is no biliary ductal dilation.

Pancreas: Normal contours without ductal dilatation. No
peripancreatic fluid collection.

Spleen: Unremarkable.

Adrenals/Urinary Tract:

--Adrenal glands: Unremarkable.

--Right kidney/ureter: No hydronephrosis or radiopaque kidney
stones.

--Left kidney/ureter: No hydronephrosis or radiopaque kidney stones.

--Urinary bladder: There is diffuse circumferential wall thickening
of the urinary bladder.

Stomach/Bowel:

--Stomach/Duodenum: There appears to be esophageal wall thickening
involving the partially visualized distal esophagus.

--Small bowel: Unremarkable.

--Colon: There are scattered colonic diverticula without CT evidence
for diverticulitis.

--Appendix: Normal.

Vascular/Lymphatic: Atherosclerotic calcification is present within
the non-aneurysmal abdominal aorta, without hemodynamically
significant stenosis.

--No retroperitoneal lymphadenopathy.

--No mesenteric lymphadenopathy.

--No pelvic or inguinal lymphadenopathy.

Reproductive: The prostate gland is enlarged.

Other: No ascites or free air. The abdominal wall is normal.

Musculoskeletal. No acute displaced fractures.
IMPRESSION: 1. Probable wall thickening of the distal esophagus which can be
seen in patients with esophagitis.
2. Circumferential wall thickening of the urinary bladder. Correlate
with urinalysis to exclude cystitis.
3. Severe hepatic steatosis.
4. Trace bilateral pleural effusions with adjacent mild atelectasis.
5. Colonic diverticulosis without CT evidence for diverticulitis.
6. Enlarged prostate gland.

Aortic Atherosclerosis ([id number].V).

## 2022-01-25 ENCOUNTER — Inpatient Hospital Stay
Admission: EM | Admit: 2022-01-25 | Discharge: 2022-01-30 | DRG: 871 | Disposition: A | Discharge status: Home Health | Payer: Medicare HMO | Attending: Internal Medicine | Admitting: Internal Medicine

## 2022-01-25 ENCOUNTER — Emergency Department: Payer: Medicare HMO

## 2022-01-25 ENCOUNTER — Inpatient Hospital Stay: Payer: Medicare HMO

## 2022-01-25 ENCOUNTER — Other Ambulatory Visit: Payer: Self-pay

## 2022-01-25 DIAGNOSIS — K219 Gastro-esophageal reflux disease without esophagitis: Secondary | ICD-10-CM | POA: Diagnosis present

## 2022-01-25 DIAGNOSIS — Z8673 Personal history of transient ischemic attack (TIA), and cerebral infarction without residual deficits: Secondary | ICD-10-CM | POA: Diagnosis not present

## 2022-01-25 DIAGNOSIS — K760 Fatty (change of) liver, not elsewhere classified: Secondary | ICD-10-CM | POA: Diagnosis present

## 2022-01-25 DIAGNOSIS — N401 Enlarged prostate with lower urinary tract symptoms: Secondary | ICD-10-CM | POA: Diagnosis present

## 2022-01-25 DIAGNOSIS — A419 Sepsis, unspecified organism: Secondary | ICD-10-CM | POA: Diagnosis not present

## 2022-01-25 DIAGNOSIS — E872 Acidosis, unspecified: Secondary | ICD-10-CM | POA: Diagnosis present

## 2022-01-25 DIAGNOSIS — N3000 Acute cystitis without hematuria: Secondary | ICD-10-CM | POA: Diagnosis present

## 2022-01-25 DIAGNOSIS — F03A3 Unspecified dementia, mild, with mood disturbance: Secondary | ICD-10-CM | POA: Diagnosis present

## 2022-01-25 DIAGNOSIS — I2510 Atherosclerotic heart disease of native coronary artery without angina pectoris: Secondary | ICD-10-CM | POA: Diagnosis present

## 2022-01-25 DIAGNOSIS — H5461 Unqualified visual loss, right eye, normal vision left eye: Secondary | ICD-10-CM | POA: Diagnosis present

## 2022-01-25 DIAGNOSIS — R531 Weakness: Secondary | ICD-10-CM

## 2022-01-25 DIAGNOSIS — R7401 Elevation of levels of liver transaminase levels: Secondary | ICD-10-CM | POA: Diagnosis present

## 2022-01-25 DIAGNOSIS — N179 Acute kidney failure, unspecified: Secondary | ICD-10-CM | POA: Diagnosis present

## 2022-01-25 DIAGNOSIS — F1010 Alcohol abuse, uncomplicated: Secondary | ICD-10-CM | POA: Diagnosis present

## 2022-01-25 DIAGNOSIS — I6381 Other cerebral infarction due to occlusion or stenosis of small artery: Secondary | ICD-10-CM | POA: Insufficient documentation

## 2022-01-25 DIAGNOSIS — R972 Elevated prostate specific antigen [PSA]: Secondary | ICD-10-CM | POA: Diagnosis present

## 2022-01-25 DIAGNOSIS — D649 Anemia, unspecified: Secondary | ICD-10-CM | POA: Diagnosis present

## 2022-01-25 DIAGNOSIS — Z720 Tobacco use: Secondary | ICD-10-CM

## 2022-01-25 DIAGNOSIS — R338 Other retention of urine: Secondary | ICD-10-CM | POA: Diagnosis present

## 2022-01-25 DIAGNOSIS — R5381 Other malaise: Secondary | ICD-10-CM | POA: Diagnosis present

## 2022-01-25 DIAGNOSIS — Z882 Allergy status to sulfonamides status: Secondary | ICD-10-CM

## 2022-01-25 DIAGNOSIS — G9341 Metabolic encephalopathy: Secondary | ICD-10-CM | POA: Diagnosis present

## 2022-01-25 DIAGNOSIS — E876 Hypokalemia: Secondary | ICD-10-CM | POA: Diagnosis present

## 2022-01-25 DIAGNOSIS — D696 Thrombocytopenia, unspecified: Secondary | ICD-10-CM | POA: Diagnosis present

## 2022-01-25 DIAGNOSIS — D72819 Decreased white blood cell count, unspecified: Secondary | ICD-10-CM | POA: Diagnosis present

## 2022-01-25 DIAGNOSIS — A498 Other bacterial infections of unspecified site: Secondary | ICD-10-CM | POA: Diagnosis present

## 2022-01-25 DIAGNOSIS — I10 Essential (primary) hypertension: Secondary | ICD-10-CM | POA: Diagnosis present

## 2022-01-25 DIAGNOSIS — A4151 Sepsis due to Escherichia coli [E. coli]: Principal | ICD-10-CM | POA: Diagnosis present

## 2022-01-25 DIAGNOSIS — N390 Urinary tract infection, site not specified: Secondary | ICD-10-CM | POA: Diagnosis not present

## 2022-01-25 DIAGNOSIS — R9389 Abnormal findings on diagnostic imaging of other specified body structures: Secondary | ICD-10-CM | POA: Diagnosis present

## 2022-01-25 DIAGNOSIS — Z7189 Other specified counseling: Secondary | ICD-10-CM | POA: Diagnosis not present

## 2022-01-25 DIAGNOSIS — E785 Hyperlipidemia, unspecified: Secondary | ICD-10-CM | POA: Diagnosis present

## 2022-01-25 DIAGNOSIS — R6520 Severe sepsis without septic shock: Secondary | ICD-10-CM | POA: Diagnosis present

## 2022-01-25 DIAGNOSIS — R9431 Abnormal electrocardiogram [ECG] [EKG]: Secondary | ICD-10-CM | POA: Diagnosis present

## 2022-01-25 DIAGNOSIS — N419 Inflammatory disease of prostate, unspecified: Secondary | ICD-10-CM | POA: Diagnosis present

## 2022-01-25 DIAGNOSIS — R7989 Other specified abnormal findings of blood chemistry: Secondary | ICD-10-CM | POA: Diagnosis present

## 2022-01-25 DIAGNOSIS — Z1152 Encounter for screening for COVID-19: Secondary | ICD-10-CM | POA: Diagnosis not present

## 2022-01-25 DIAGNOSIS — R935 Abnormal findings on diagnostic imaging of other abdominal regions, including retroperitoneum: Secondary | ICD-10-CM | POA: Diagnosis present

## 2022-01-25 DIAGNOSIS — F03A4 Unspecified dementia, mild, with anxiety: Secondary | ICD-10-CM | POA: Diagnosis present

## 2022-01-25 DIAGNOSIS — E878 Other disorders of electrolyte and fluid balance, not elsewhere classified: Secondary | ICD-10-CM | POA: Diagnosis present

## 2022-01-25 HISTORY — DX: Other bacterial infections of unspecified site: A49.8

## 2022-01-25 LAB — CBC WITH DIFFERENTIAL/PLATELET
Abs Immature Granulocytes: 0.03 10*3/uL (ref 0.00–0.07)
Basophils Absolute: 0 10*3/uL (ref 0.0–0.1)
Basophils Relative: 1 %
Eosinophils Absolute: 0 10*3/uL (ref 0.0–0.5)
Eosinophils Relative: 1 %
HCT: 39 % (ref 39.0–52.0)
Hemoglobin: 13.6 g/dL (ref 13.0–17.0)
Immature Granulocytes: 1 %
Lymphocytes Relative: 7 %
Lymphs Abs: 0.2 10*3/uL — ABNORMAL LOW (ref 0.7–4.0)
MCH: 34.8 pg — ABNORMAL HIGH (ref 26.0–34.0)
MCHC: 34.9 g/dL (ref 30.0–36.0)
MCV: 99.7 fL (ref 80.0–100.0)
Monocytes Absolute: 0 10*3/uL — ABNORMAL LOW (ref 0.1–1.0)
Monocytes Relative: 1 %
Neutro Abs: 2.2 10*3/uL (ref 1.7–7.7)
Neutrophils Relative %: 89 %
Platelets: 111 10*3/uL — ABNORMAL LOW (ref 150–400)
RBC: 3.91 MIL/uL — ABNORMAL LOW (ref 4.22–5.81)
RDW: 13.2 % (ref 11.5–15.5)
WBC: 2.5 10*3/uL — ABNORMAL LOW (ref 4.0–10.5)
nRBC: 0 % (ref 0.0–0.2)

## 2022-01-25 LAB — COMPREHENSIVE METABOLIC PANEL
ALT: 53 U/L — ABNORMAL HIGH (ref 0–44)
AST: 224 U/L — ABNORMAL HIGH (ref 15–41)
Albumin: 3.8 g/dL (ref 3.5–5.0)
Alkaline Phosphatase: 73 U/L (ref 38–126)
Anion gap: 23 — ABNORMAL HIGH (ref 5–15)
BUN: 15 mg/dL (ref 8–23)
CO2: 16 mmol/L — ABNORMAL LOW (ref 22–32)
Calcium: 7.7 mg/dL — ABNORMAL LOW (ref 8.9–10.3)
Chloride: 105 mmol/L (ref 98–111)
Creatinine, Ser: 1.39 mg/dL — ABNORMAL HIGH (ref 0.61–1.24)
GFR, Estimated: 56 mL/min — ABNORMAL LOW (ref >60)
Glucose, Bld: 129 mg/dL — ABNORMAL HIGH (ref 70–99)
Potassium: 2.4 mmol/L — CL (ref 3.5–5.1)
Sodium: 144 mmol/L (ref 135–145)
Total Bilirubin: 2.6 mg/dL — ABNORMAL HIGH (ref 0.3–1.2)
Total Protein: 6.9 g/dL (ref 6.5–8.1)

## 2022-01-25 LAB — URINALYSIS, COMPLETE (UACMP) WITH MICROSCOPIC
Bilirubin Urine: NEGATIVE
Glucose, UA: NEGATIVE mg/dL
Ketones, ur: NEGATIVE mg/dL
Nitrite: NEGATIVE
Protein, ur: 100 mg/dL — AB
RBC / HPF: >50 RBC/hpf — ABNORMAL HIGH (ref 0–5)
Specific Gravity, Urine: 1.013 (ref 1.005–1.030)
WBC, UA: >50 WBC/hpf — ABNORMAL HIGH (ref 0–5)
pH: 5 (ref 5.0–8.0)

## 2022-01-25 LAB — LACTIC ACID, PLASMA
Lactic Acid, Venous: >9 mmol/L (ref 0.5–1.9)
Lactic Acid, Venous: 7 mmol/L (ref 0.5–1.9)

## 2022-01-25 LAB — MAGNESIUM: Magnesium: 0.9 mg/dL — CL (ref 1.7–2.4)

## 2022-01-25 LAB — RESP PANEL BY RT-PCR (FLU A&B, COVID) ARPGX2
Influenza A by PCR: NEGATIVE
Influenza B by PCR: NEGATIVE
SARS Coronavirus 2 by RT PCR: NEGATIVE

## 2022-01-25 LAB — APTT: aPTT: 26 seconds (ref 24–36)

## 2022-01-25 LAB — LIPASE, BLOOD: Lipase: 45 U/L (ref 11–51)

## 2022-01-25 LAB — PROTIME-INR
INR: 1.3 — ABNORMAL HIGH (ref 0.8–1.2)
Prothrombin Time: 15.6 seconds — ABNORMAL HIGH (ref 11.4–15.2)

## 2022-01-25 LAB — TROPONIN I (HIGH SENSITIVITY)
Troponin I (High Sensitivity): 112 ng/L (ref <18)
Troponin I (High Sensitivity): 153 ng/L (ref <18)

## 2022-01-25 MED ORDER — ONDANSETRON HCL 4 MG/2ML IJ SOLN
4.0000 mg | Freq: Once | INTRAMUSCULAR | Status: AC
  Administered 2022-01-25: 4 mg via INTRAVENOUS
  Filled 2022-01-25: qty 2

## 2022-01-25 MED ORDER — LACTATED RINGERS IV BOLUS
1000.0000 mL | Freq: Once | INTRAVENOUS | Status: AC
  Administered 2022-01-25: 1000 mL via INTRAVENOUS

## 2022-01-25 MED ORDER — METRONIDAZOLE 500 MG/100ML IV SOLN
500.0000 mg | Freq: Once | INTRAVENOUS | Status: AC
  Administered 2022-01-25: 500 mg via INTRAVENOUS
  Filled 2022-01-25: qty 100

## 2022-01-25 MED ORDER — POTASSIUM CHLORIDE 10 MEQ/100ML IV SOLN
10.0000 meq | INTRAVENOUS | Status: AC
  Administered 2022-01-25 – 2022-01-26 (×4): 10 meq via INTRAVENOUS
  Filled 2022-01-25 (×5): qty 100

## 2022-01-25 MED ORDER — HEPARIN SODIUM (PORCINE) 5000 UNIT/ML IJ SOLN
5000.0000 [IU] | Freq: Three times a day (TID) | INTRAMUSCULAR | Status: DC
  Administered 2022-01-26: 5000 [IU] via SUBCUTANEOUS
  Filled 2022-01-25: qty 1

## 2022-01-25 MED ORDER — HYDRALAZINE HCL 20 MG/ML IJ SOLN
5.0000 mg | INTRAMUSCULAR | Status: DC | PRN
  Administered 2022-01-27 – 2022-01-30 (×9): 5 mg via INTRAVENOUS
  Filled 2022-01-25 (×9): qty 1

## 2022-01-25 MED ORDER — ACETAMINOPHEN 650 MG RE SUPP: 650.0000 mg | Freq: Four times a day (QID) | RECTAL | Status: DC | PRN

## 2022-01-25 MED ORDER — POTASSIUM CHLORIDE CRYS ER 20 MEQ PO TBCR: 40.0000 meq | EXTENDED_RELEASE_TABLET | Freq: Once | ORAL | Status: DC

## 2022-01-25 MED ORDER — SODIUM CHLORIDE 0.9 % IV SOLN
2.0000 g | Freq: Once | INTRAVENOUS | Status: AC
  Administered 2022-01-25: 2 g via INTRAVENOUS
  Filled 2022-01-25: qty 12.5

## 2022-01-25 MED ORDER — PANTOPRAZOLE SODIUM 40 MG IV SOLR
40.0000 mg | Freq: Two times a day (BID) | INTRAVENOUS | Status: DC
  Administered 2022-01-26 – 2022-01-28 (×7): 40 mg via INTRAVENOUS
  Filled 2022-01-25 (×7): qty 10

## 2022-01-25 MED ORDER — LACTATED RINGERS IV SOLN: INTRAVENOUS | Status: AC

## 2022-01-25 MED ORDER — VANCOMYCIN HCL 1500 MG/300ML IV SOLN
1500.0000 mg | Freq: Once | INTRAVENOUS | Status: AC
  Administered 2022-01-25: 1500 mg via INTRAVENOUS
  Filled 2022-01-25: qty 300

## 2022-01-25 MED ORDER — LACTATED RINGERS IV BOLUS
1000.0000 mL | Freq: Once | INTRAVENOUS | Status: AC
  Administered 2022-01-26: 1000 mL via INTRAVENOUS

## 2022-01-25 MED ORDER — VANCOMYCIN HCL IN DEXTROSE 1-5 GM/200ML-% IV SOLN: 1000.0000 mg | Freq: Once | INTRAVENOUS | Status: DC

## 2022-01-25 MED ORDER — SODIUM CHLORIDE 0.9 % IV SOLN
2.0000 g | Freq: Two times a day (BID) | INTRAVENOUS | Status: DC
  Administered 2022-01-26: 2 g via INTRAVENOUS
  Filled 2022-01-25: qty 2

## 2022-01-25 MED ORDER — ACETAMINOPHEN 325 MG PO TABS
650.0000 mg | ORAL_TABLET | Freq: Four times a day (QID) | ORAL | Status: DC | PRN
  Filled 2022-01-25 (×2): qty 2

## 2022-01-25 MED ORDER — MAGNESIUM SULFATE 2 GM/50ML IV SOLN
2.0000 g | Freq: Once | INTRAVENOUS | Status: AC
  Administered 2022-01-25: 2 g via INTRAVENOUS
  Filled 2022-01-25: qty 50

## 2022-01-25 NOTE — ED Provider Notes (Signed)
[facility] Provider Note    Event Date/Time   First MD Initiated Contact with Patient 01/25/22 2024     (approximate)   History   Chief Complaint Weakness   HPI  Elijah Murphy is a 66 y.o. male with past medical history of hypertension, hyperlipidemia, and dementia who presents to the ED for generalized weakness.  Per EMS, family has noticed that patient has been much weaker than usual over the course of the day today.  He has seemed more lethargic and has dry heaves multiple times.  Family told EMS that he has had "UTI symptoms" for several weeks, but has refused to go to the doctor to be evaluated for this.  Did not specify what symptoms he has been having.  Patient states that he is feeling nauseous but denies any pain in his abdomen or diarrhea.  He denies any fevers, dysuria, or flank pain.  He denies any cough, chest pain, or shortness of breath.  EMS noted patient to be tachycardic and tachypneic prior to arrival, was given 500 cc IV fluid bolus.  Family reported to EMS that he has a history of mild dementia but is currently at his baseline mental status.      Physical Exam   Triage Vital Signs: ED Triage Vitals [01/25/22 2024]  Enc Vitals Group     BP (!) 158/95     Pulse Rate (!) 129     Resp (!) 23     Temp 97.9 F (36.6 C)     Temp Source Oral     SpO2 (!) 89 %     Weight      Height      Head Circumference      Peak Flow      Pain Score 0     Pain Loc      Pain Edu?      Excl. in GC?     Most recent vital signs: Vitals:   01/25/22 2100 01/25/22 2130  BP: 127/89 (!) 122/92  Pulse: (!) 104 (!) 104  Resp: 18 20  Temp:    SpO2: 98% 94%    Constitutional: Alert and oriented to person and place, but not time or situation. Diaphoretic appearing. Eyes: Conjunctivae are normal. Head: Atraumatic. Nose: No congestion/rhinnorhea. Mouth/Throat: Mucous membranes are moist.  Cardiovascular: Tachycardic, regular rhythm. Grossly normal  heart sounds.  2+ radial pulses bilaterally. Respiratory: Tachypneic with normal respiratory effort.  No retractions. Lungs CTAB. Gastrointestinal: Soft and nontender. No distention. Musculoskeletal: No lower extremity tenderness nor edema.  Neurologic:  Normal speech and language. No gross focal neurologic deficits are appreciated.    ED Results / Procedures / Treatments   Labs (all labs ordered are listed, but only abnormal results are displayed) Labs Reviewed  LACTIC ACID, PLASMA - Abnormal; Notable for the following components:      Result Value   Lactic Acid, Venous >9.0 (*)    All other components within normal limits  COMPREHENSIVE METABOLIC PANEL - Abnormal; Notable for the following components:   Potassium 2.4 (*)    CO2 16 (*)    Glucose, Bld 129 (*)    Creatinine, Ser 1.39 (*)    Calcium 7.7 (*)    AST 224 (*)    ALT 53 (*)    Total Bilirubin 2.6 (*)    GFR, Estimated 56 (*)    Anion gap 23 (*)    All other components within normal limits  CBC WITH  DIFFERENTIAL/PLATELET - Abnormal; Notable for the following components:   WBC 2.5 (*)    RBC 3.91 (*)    MCH 34.8 (*)    Platelets 111 (*)    Lymphs Abs 0.2 (*)    Monocytes Absolute 0.0 (*)    All other components within normal limits  PROTIME-INR - Abnormal; Notable for the following components:   Prothrombin Time 15.6 (*)    INR 1.3 (*)    All other components within normal limits  URINALYSIS, COMPLETE (UACMP) WITH MICROSCOPIC - Abnormal; Notable for the following components:   Color, Urine AMBER (*)    APPearance TURBID (*)    Hgb urine dipstick MODERATE (*)    Protein, ur 100 (*)    Leukocytes,Ua LARGE (*)    RBC / HPF >50 (*)    WBC, UA >50 (*)    Bacteria, UA MANY (*)    All other components within normal limits  TROPONIN I (HIGH SENSITIVITY) - Abnormal; Notable for the following components:   Troponin I (High Sensitivity) 112 (*)    All other components within normal limits  RESP PANEL BY RT-PCR (FLU  A&B, COVID) ARPGX2  CULTURE, BLOOD (ROUTINE X 2)  CULTURE, BLOOD (ROUTINE X 2)  URINE CULTURE  APTT  LIPASE, BLOOD  LACTIC ACID, PLASMA  MAGNESIUM  TROPONIN I (HIGH SENSITIVITY)     EKG  ED ECG REPORT I, Chesley Noon, the attending physician, personally viewed and interpreted this ECG.   Date: 01/25/2022  EKG Time: 20:46  Rate: 118  Rhythm: sinus tachycardia  Axis: Normal  Intervals:none  ST&T Change: None  RADIOLOGY Chest x-ray reviewed and interpreted by me with no infiltrate, edema, or effusion.  PROCEDURES:  Critical Care performed: Yes, see critical care procedure note(s)  .Critical Care  Performed by: Chesley Noon, MD Authorized by: Chesley Noon, MD   Critical care provider statement:    Critical care time (minutes):  30   Critical care time was exclusive of:  Separately billable procedures and treating other patients and teaching time   Critical care was necessary to treat or prevent imminent or life-threatening deterioration of the following conditions:  Respiratory failure and sepsis   Critical care was time spent personally by me on the following activities:  Development of treatment plan with patient or surrogate, discussions with consultants, evaluation of patient's response to treatment, examination of patient, ordering and review of laboratory studies, ordering and review of radiographic studies, ordering and performing treatments and interventions, pulse oximetry, re-evaluation of patient's condition and review of old charts   I assumed direction of critical care for this patient from another provider in my specialty: no     Care discussed with: admitting provider      MEDICATIONS ORDERED IN ED: Medications  vancomycin (VANCOREADY) IVPB 1500 mg/300 mL (1,500 mg Intravenous New Bag/Given 01/25/22 2221)  magnesium sulfate IVPB 2 g 50 mL (has no administration in time range)  potassium chloride 10 mEq in 100 mL IVPB (10 mEq Intravenous Not Given  01/25/22 2222)  potassium chloride SA (KLOR-CON M) CR tablet 40 mEq (40 mEq Oral Not Given 01/25/22 2224)  ceFEPIme (MAXIPIME) 2 g in sodium chloride 0.9 % 100 mL IVPB (0 g Intravenous Stopped 01/25/22 2125)  metroNIDAZOLE (FLAGYL) IVPB 500 mg (500 mg Intravenous New Bag/Given 01/25/22 2126)  lactated ringers bolus 1,000 mL (0 mLs Intravenous Stopped 01/25/22 2125)  ondansetron (ZOFRAN) injection 4 mg (4 mg Intravenous Given 01/25/22 2054)  lactated ringers bolus 1,000 mL (1,000  mLs Intravenous New Bag/Given 01/25/22 2208)     IMPRESSION / MDM / ASSESSMENT AND PLAN / ED COURSE  I reviewed the triage vital signs and the nursing notes.                              66 y.o. male with past medical history of hypertension, hyperlipidemia, and dementia who presents to the ED for generalized weakness and lethargy developing over the course of the day today, noted to be tachycardic and tachypneic by EMS.  Patient's presentation is most consistent with acute presentation with potential threat to life or bodily function.  Differential diagnosis includes, but is not limited to, sepsis, UTI, pneumonia, appendicitis, cholecystitis, AKI, electrolyte abnormality, GI bleed, anemia, stroke, other intracranial process, ACS.  Patient ill-appearing and diaphoretic but in no acute distress, vital signs remarkable for tachycardia, tachypnea, and mild hypoxia.  Patient placed on 2 L nasal cannula with improvement, lungs are clear to auscultation bilaterally.  Presentation is concerning for sepsis and we will start on broad-spectrum antibiotics, hydrate with IV fluids.  Chest x-ray and urinalysis are pending, patient currently with benign abdominal exam but would expand workup if x-ray and urinalysis are unremarkable.  He appears to be at his reported baseline mental status with no focal neurologic deficit, lower suspicion for stroke.  Patient leukopenic which is consistent with sepsis, source appears to be UTI given  his grossly infected urine.  Patient received broad-spectrum antibiotics, lactic acid noted to be greater than 9 and we will hydrate with 2 L IV fluids here in the ED, he received 500 cc with EMS to complete 30 cc/kg bolus.  Labs also remarkable for marked hypokalemia, will replete potassium and magnesium.  Anion gap acidosis likely secondary to his lactic acidosis.  Troponin is elevated, likely secondary to sepsis and we will trend but hold off on heparin.  Chest x-ray is unremarkable, case discussed with hospitalist for admission.      FINAL CLINICAL IMPRESSION(S) / ED DIAGNOSES   Final diagnoses:  Sepsis with acute renal failure without septic shock, due to unspecified organism, unspecified acute renal failure type (HCC)  Acute cystitis without hematuria     Rx / DC Orders   ED Discharge Orders     None        Note:  This document was prepared using Dragon voice recognition software and may include unintentional dictation errors.   Chesley Noon, MD 01/25/22 2235

## 2022-01-25 NOTE — Progress Notes (Signed)
Pharmacy Antibiotic Note  Elijah Murphy is a 66 y.o. male admitted on 01/25/2022 with sepsis.  Pharmacy has been consulted for Cefepime dosing.  Plan: Cefepime 2 gm IV X 1 given in ED on 11/23 @ 2056. Cefepime 2 gm IV Q12H ordered to start on 11/24 @ 2100.   Height: 5\' 9"  (175.3 cm) Weight: 70.3 kg (155 lb) IBW/kg (Calculated) : 70.7  Temp (24hrs), Avg:97.9 F (36.6 C), Min:97.9 F (36.6 C), Max:97.9 F (36.6 C)  Recent Labs  Lab 01/25/22 2026  WBC 2.5*  CREATININE 1.39*  LATICACIDVEN >9.0*    Estimated Creatinine Clearance: 52 mL/min (A) (by C-G formula based on SCr of 1.39 mg/dL (H)).    No Known Allergies  Antimicrobials this admission:   >>    >>   Dose adjustments this admission:   Microbiology results:  BCx:   UCx:    Sputum:    MRSA PCR:   Thank you for allowing pharmacy to be a part of this patient's care.  Kharson Rasmusson D 01/25/2022 11:15 PM

## 2022-01-25 NOTE — Assessment & Plan Note (Addendum)
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

## 2022-01-25 NOTE — ED Triage Notes (Signed)
Pt has had UTI symptoms past several weeks per EMS, became weak and lethargic today with dry-heaving. Pt took Zofran prior to EMS arrival with decreased nausea. EMS reports patient sweating profusely with HR in 130-140 range. Hx of Dementia with mild confusion at baseline, no more than usual per daughter. Hx of Sepsis d/t UTI.

## 2022-01-25 NOTE — Assessment & Plan Note (Addendum)
    Latest Ref Rng & Units 01/25/2022    8:26 PM 11/21/2020    4:20 PM 02/08/2020    2:14 PM  CMP  Glucose 70 - 99 mg/dL 333  545    BUN 8 - 23 mg/dL 15  10    Creatinine 6.25 - 1.24 mg/dL 6.38  9.37    Sodium 342 - 145 mmol/L 144  128    Potassium 3.5 - 5.1 mmol/L 2.4  2.8  3.2   Chloride 98 - 111 mmol/L 105  88    CO2 22 - 32 mmol/L 16  22    Calcium 8.9 - 10.3 mg/dL 7.7  7.0    Total Protein 6.5 - 8.1 g/dL 6.9     Total Bilirubin 0.3 - 1.2 mg/dL 2.6     Alkaline Phos 38 - 126 U/L 73     AST 15 - 41 U/L 224     ALT 0 - 44 U/L 53     We will replace and follow levels.

## 2022-01-25 NOTE — Assessment & Plan Note (Addendum)
    Latest Ref Rng & Units 01/25/2022    8:26 PM 11/21/2020    4:20 PM 02/07/2020    3:46 AM  CBC  WBC 4.0 - 10.5 K/uL 2.5  5.8  3.6   Hemoglobin 13.0 - 17.0 g/dL 22.3  36.1  22.4   Hematocrit 39.0 - 52.0 % 39.0  35.6  28.4   Platelets 150 - 400 K/uL 111  127  89     Hb of 13.6 is erroneous due to dehydration.  Suspect pt is still anemic and we will do type/screen/cross if needed.  Occult./ protonix. Npo except for sips with meds after swallow.

## 2022-01-25 NOTE — Assessment & Plan Note (Addendum)
Due to UTI/ Prostatitis. Check PSA. Ct abd / pelvis.  PRN sepsis orders. Indwelling foley for post obstructive. Vitals:   01/25/22 2100 01/25/22 2130 01/25/22 2200 01/25/22 2330  BP: 127/89 (!) 122/92 126/86 (!) 144/99  Pulse: (!) 104 (!) 104 (!) 105 (!) 104  Temp:      Resp: 18 20 18  (!) 21  Height:      Weight:      SpO2: 98% 94% 96% 95%  TempSrc:      BMI (Calculated):       SpO2: 95 % O2 Flow Rate (L/min): 3 L/min   Current Facility-Administered Medications:    acetaminophen (TYLENOL) tablet 650 mg, 650 mg, Oral, Q6H PRN **OR** acetaminophen (TYLENOL) suppository 650 mg, 650 mg, Rectal, Q6H PRN, , MD   [START ON 01/26/2022] ceFEPIme (MAXIPIME) 2 g in sodium chloride 0.9 % 100 mL IVPB, 2 g, Intravenous, Q12H, [place] Agent V, MD   heparin injection 5,000 Units, 5,000 Units, Subcutaneous, Q8H, [place] Millon V, MD   hydrALAZINE (APRESOLINE) injection 5 mg, 5 mg, Intravenous, Q4H PRN, 01/28/2022, MD   [START ON 01/26/2022] lactated ringers bolus 1,000 mL, 1,000 mL, Intravenous, Once, 01/28/2022 V, MD   lactated ringers infusion, , Intravenous, Continuous, Bode Pieper V, MD   magnesium sulfate IVPB 2 g 50 mL, 2 g, Intravenous, Once, Irena Cords V, MD, Last Rate: 50 mL/hr at 01/25/22 2319, 2 g at 01/25/22 2319   pantoprazole (PROTONIX) injection 40 mg, 40 mg, Intravenous, Q12H, Jonathen Rathman V, MD   potassium chloride 10 mEq in 100 mL IVPB, 10 mEq, Intravenous, Q1 Hr x 4, Domonique Brouillard V, MD, Last Rate: 100 mL/hr at 01/25/22 2245, 10 mEq at 01/25/22 2245   potassium chloride SA (KLOR-CON M) CR tablet 40 mEq, 40 mEq, Oral, Once, 01/27/22, MD   vancomycin (VANCOREADY) IVPB 1500 mg/300 mL, 1,500 mg, Intravenous, Once, Gertha Calkin, [facility], Last Rate: 150 mL/hr at 01/25/22 2221, 1,500 mg at 01/25/22 2221  Current Outpatient Medications:    lisinopril (ZESTRIL) 10 MG tablet, Take 10 mg by mouth daily. , Disp: , Rfl:    potassium chloride 20 MEQ TBCR, Take 20 mEq by mouth  2 (two) times daily for 7 days., Disp: 14 tablet, Rfl: 0

## 2022-01-25 NOTE — Assessment & Plan Note (Addendum)
Attribute to Uti and severe sepsis. Pt has symptoms for several weeks and has not seeked treatment.  Fall precaution. Aspiration precaution. STR on d/c.

## 2022-01-25 NOTE — H&P (Signed)
History and Physical    Chief Complaint: Generalized weakness.   HISTORY OF PRESENT ILLNESS: NORMA IGNASIAK is an 66 y.o. male coming for generalized weakness. He has had UTI for several weeks and does not want to go to doctor.  Pt has htn and has had a  lacunar infarct based on previous imaging.  Sometimes he leans to his left when he is ill and it resolves spontaneously.  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      Pt has PMH as below: Past Medical History:  Diagnosis Date   E. coli infection    Hyperlipidemia 08/10/2016   Hypertension      Review of Systems  Neurological:  Positive for dizziness and weakness.      No Known Allergies   Past Surgical History:  Procedure Laterality Date   APPENDECTOMY     KNEE SURGERY Left    TONSILLECTOMY        Social History   Socioeconomic History   Marital status: Married    Spouse name: Not on file   Number of children: Not on file   Years of education: Not on file   Highest education level: Not on file  Occupational History   Not on file  Tobacco Use   Smoking status: Never   Smokeless tobacco: Current    Types: Chew  Vaping Use   Vaping Use: Never used  Substance and Sexual Activity   Alcohol use: Yes    Comment: occasionally   Drug use: No   Sexual activity: Not on file  Other Topics Concern   Not on file  Social History Narrative   Not on file   Social Determinants of Health   Financial Resource Strain: Not on file  Food Insecurity: Not on file  Transportation Needs: Not on file  Physical Activity: Not on file  Stress: Not on file  Social Connections: Not on file      CURRENT MEDS:    Current Facility-Administered Medications (Cardiovascular):    hydrALAZINE (APRESOLINE) injection 5 mg     Current Facility-Administered Medications (Analgesics):    acetaminophen (TYLENOL) tablet 650 mg **OR** acetaminophen (TYLENOL) suppository 650  mg   Current Facility-Administered Medications (Hematological):    heparin injection 5,000 Units   Current Facility-Administered Medications (Other):    ceFEPIme (MAXIPIME) 2 g in sodium chloride 0.9 % 100 mL IVPB   lactated ringers infusion   pantoprazole (PROTONIX) injection 40 mg   potassium chloride 10 mEq in 100 mL IVPB   potassium chloride SA (KLOR-CON M) CR tablet 40 mEq  No current outpatient medications on file.    ED Course: Pt in Ed is ill appearing and septic. Vitals:   01/25/22 2100 01/25/22 2130 01/25/22 2200 01/25/22 2330  BP: 127/89 (!) 122/92 126/86 (!) 144/99  Pulse: (!) 104 (!) 104 (!) 105 (!) 104  Resp: _0 (!) 21  Temp:      TempSrc:      SpO2: 98% 94% 96% 95%  Weight:      Height:       Total I/O In: 2350 [IV Piggyback:2350] Out: -  SpO2: 95 % O2 Flow Rate (L/min): 3 L/min Blood work in ed shows: Lactic acidosis. Electrolyte abnormalities. AKI. Transaminitis. Leukocytosis. Mild elevated INR. Abnormal troponins.  Results for orders placed or performed during the hospital encounter of 01/25/22 (from the past 48 hour(s))  Lactic acid, plasma     Status:  Abnormal   Collection Time: 01/25/22  8:26 PM  Result Value Ref Range   Lactic Acid, Venous >9.0 (HH) 0.5 - 1.9 mmol/L    Comment: CRITICAL RESULT CALLED TO, READ BACK BY AND VERIFIED WITH ALEX ROHR_0  01/25/22 RH Performed at [facility], [address] [place], [place] [address]   Comprehensive metabolic panel     Status: Abnormal   Collection Time: 01/25/22  8:26 PM  Result Value Ref Range   Sodium 144 135 - 145 mmol/L    Comment: ELECTROLYTES REPEATED TO VERIFY RH   Potassium 2.4 (LL) 3.5 - 5.1 mmol/L    Comment: CRITICAL RESULT CALLED TO, READ BACK BY AND VERIFIED WITH ALEX ROHR_1  01/25/22 RH    Chloride 105 98 - 111 mmol/L   CO2 16 (L) 22 - 32 mmol/L   Glucose, Bld 129 (H) 70 - 99 mg/dL    Comment: Glucose reference range applies only to samples taken after  fasting for at least 8 hours.   BUN 15 8 - 23 mg/dL   Creatinine, Ser 1.39 (H) 0.61 - 1.24 mg/dL   Calcium 7.7 (L) 8.9 - 10.3 mg/dL   Total Protein 6.9 6.5 - 8.1 g/dL   Albumin 3.8 3.5 - 5.0 g/dL   AST 224 (H) 15 - 41 U/L   ALT 53 (H) 0 - 44 U/L   Alkaline Phosphatase 73 38 - 126 U/L   Total Bilirubin 2.6 (H) 0.3 - 1.2 mg/dL   GFR, Estimated 56 (L) >60 mL/min    Comment: (NOTE) Calculated using the CKD-EPI Creatinine Equation (2021)    Anion gap 23 (H) 5 - 15    Comment: Performed at [facility], [address] [place], [place] [address]  CBC with Differential     Status: Abnormal   Collection Time: 01/25/22  8:26 PM  Result Value Ref Range   WBC 2.5 (L) 4.0 - 10.5 K/uL   RBC 3.91 (L) 4.22 - 5.81 MIL/uL   Hemoglobin 13.6 13.0 - 17.0 g/dL   HCT 39.0 39.0 - 52.0 %   MCV 99.7 80.0 - 100.0 fL   MCH 34.8 (H) 26.0 - 34.0 pg   MCHC 34.9 30.0 - 36.0 g/dL   RDW 13.2 11.5 - 15.5 %   Platelets 111 (L) 150 - 400 K/uL   nRBC 0.0 0.0 - 0.2 %   Neutrophils Relative % 89 %   Neutro Abs 2.2 1.7 - 7.7 K/uL   Lymphocytes Relative 7 %   Lymphs Abs 0.2 (L) 0.7 - 4.0 K/uL   Monocytes Relative 1 %   Monocytes Absolute 0.0 (L) 0.1 - 1.0 K/uL   Eosinophils Relative 1 %   Eosinophils Absolute 0.0 0.0 - 0.5 K/uL   Basophils Relative 1 %   Basophils Absolute 0.0 0.0 - 0.1 K/uL   Immature Granulocytes 1 %   Abs Immature Granulocytes 0.03 0.00 - 0.07 K/uL    Comment: Performed at [facility], [address] [place], [place] [address]  Protime-INR     Status: Abnormal   Collection Time: 01/25/22  8:26 PM  Result Value Ref Range   Prothrombin Time 15.6 (H) 11.4 - 15.2 seconds   INR 1.3 (H) 0.8 - 1.2    Comment: (NOTE) INR goal varies based on device and disease states. Performed at [facility], [address] [place],  [address]   APTT     Status: None   Collection Time: 01/25/22  8:26 PM  Result Value  Ref Range   aPTT 26 24 - 36 seconds    Comment:  Performed at [facility], [address] [place], [place] [address]  Lipase, blood     Status: None   Collection Time: 01/25/22  8:26 PM  Result Value Ref Range   Lipase 45 11 - 51 U/L    Comment: Performed at [facility] [place], [place] [address]  Troponin I (High Sensitivity)     Status: Abnormal   Collection Time: 01/25/22  8:26 PM  Result Value Ref Range   Troponin I (High Sensitivity) 112 (HH) <18 ng/L    Comment: CRITICAL RESULT CALLED TO, READ BACK BY AND VERIFIED WITH ALEX ROHR_0  01/25/22 RH (NOTE) Elevated high sensitivity troponin I (hsTnI) values and significant  changes across serial measurements may suggest ACS but many other  chronic and acute conditions are known to elevate hsTnI results.  Refer to the "Links" section for chest pain algorithms and additional  guidance. Performed at [facility], [address] [place], [place] [address]   Resp Panel by RT-PCR (Flu A&B, Covid) Anterior Nasal Swab     Status: None   Collection Time: 01/25/22  8:37 PM   Specimen: Anterior Nasal Swab  Result Value Ref Range   SARS Coronavirus 2 by RT PCR NEGATIVE NEGATIVE    Comment: (NOTE) SARS-CoV-2 target nucleic acids are NOT DETECTED.  The SARS-CoV-2 RNA is generally detectable in upper respiratory specimens during the acute phase of infection. The lowest concentration of SARS-CoV-2 viral copies this assay can detect is 138 copies/mL. A negative result does not preclude SARS-Cov-2 infection and should not be used as the sole basis for treatment or other patient management decisions. A negative result may occur with  improper specimen collection/handling, submission of specimen other than nasopharyngeal swab, presence of viral mutation(s) within the areas targeted by this assay, and inadequate number of viral copies(<138 copies/mL). A negative result must be combined with clinical observations, patient history, and  epidemiological information. The expected result is Negative.  Fact Sheet for Patients:  EntrepreneurPulse.com.au  Fact Sheet for Healthcare Providers:  IncredibleEmployment.be  This test is no t yet approved or cleared by the [place] FDA and  has been authorized for detection and/or diagnosis of SARS-CoV-2 by FDA under an Emergency Use Authorization (EUA). This EUA will remain  in effect (meaning this test can be used) for the duration of the COVID-19 declaration under Section 564(b)(1) of the Act, 21 U.S.C.section 360bbb-3(b)(1), unless the authorization is terminated  or revoked sooner.       Influenza A by PCR NEGATIVE NEGATIVE   Influenza B by PCR NEGATIVE NEGATIVE    Comment: (NOTE) The Xpert Xpress SARS-CoV-2/FLU/RSV plus assay is intended as an aid in the diagnosis of influenza from Nasopharyngeal swab specimens and should not be used as a sole basis for treatment. Nasal washings and aspirates are unacceptable for Xpert Xpress SARS-CoV-2/FLU/RSV testing.  Fact Sheet for Patients: EntrepreneurPulse.com.au  Fact Sheet for Healthcare Providers: IncredibleEmployment.be  This test is not yet approved or cleared by the [place] FDA and has been authorized for detection and/or diagnosis of SARS-CoV-2 by FDA under an Emergency Use Authorization (EUA). This EUA will remain in effect (meaning this test can be used) for the duration of the COVID-19 declaration under Section 564(b)(1) of the Act, 21 U.S.C. section 360bbb-3(b)(1), unless the authorization is terminated or revoked.  Performed at [facility], [address] [place], [place] [address]  Urinalysis, Complete w Microscopic Urine, Catheterized     Status: Abnormal   Collection Time: 01/25/22  8:37 PM  Result Value Ref Range   Color, Urine AMBER (A) YELLOW    Comment: BIOCHEMICALS MAY BE AFFECTED BY COLOR   APPearance  TURBID (A) CLEAR   Specific Gravity, Urine 1.013 1.005 - 1.030   pH 5.0 5.0 - 8.0   Glucose, UA NEGATIVE NEGATIVE mg/dL   Hgb urine dipstick MODERATE (A) NEGATIVE   Bilirubin Urine NEGATIVE NEGATIVE   Ketones, ur NEGATIVE NEGATIVE mg/dL   Protein, ur 100 (A) NEGATIVE mg/dL   Nitrite NEGATIVE NEGATIVE   Leukocytes,Ua LARGE (A) NEGATIVE   RBC / HPF >50 (H) 0 - 5 RBC/hpf   WBC, UA >50 (H) 0 - 5 WBC/hpf   Bacteria, UA MANY (A) NONE SEEN   Squamous Epithelial / LPF 0-5 0 - 5   WBC Clumps PRESENT    Mucus PRESENT    Budding Yeast PRESENT    Granular Casts, UA PRESENT    Ca Oxalate Crys, UA PRESENT     Comment: Performed at [facility], [address] [place], [place] [address]  Lactic acid, plasma     Status: Abnormal   Collection Time: 01/25/22 10:49 PM  Result Value Ref Range   Lactic Acid, Venous 7.0 (HH) 0.5 - 1.9 mmol/L    Comment: CRITICAL VALUE NOTED. VALUE IS CONSISTENT WITH PREVIOUSLY REPORTED/CALLED VALUE RH Performed at [facility], [place], [place] [address]   Troponin I (High Sensitivity)     Status: Abnormal   Collection Time: 01/25/22 10:49 PM  Result Value Ref Range   Troponin I (High Sensitivity) 153 (HH) <18 ng/L    Comment: CRITICAL VALUE NOTED. VALUE IS CONSISTENT WITH PREVIOUSLY REPORTED/CALLED VALUE RH (NOTE) Elevated high sensitivity troponin I (hsTnI) values and significant  changes across serial measurements may suggest ACS but many other  chronic and acute conditions are known to elevate hsTnI results.  Refer to the "Links" section for chest pain algorithms and additional  guidance. Performed at [facility], [address] [place], [place] [address]   Magnesium     Status: Abnormal   Collection Time: 01/25/22 10:49 PM  Result Value Ref Range   Magnesium 0.9 (LL) 1.7 - 2.4 mg/dL    Comment: CRITICAL RESULT CALLED TO, READ BACK BY AND VERIFIED WITH ALEX ROHR_0  01/25/22 RH Performed at [facility], [address] [place], [place] [address]   CK     Status: None   Collection Time: 01/25/22 10:49 PM  Result Value Ref Range   Total CK 98 49 - 397 U/L    Comment: Performed at [facility], [address] [place], [place] [address]    In Ed pt received  Meds ordered this encounter  Medications   ceFEPIme (MAXIPIME) 2 g in sodium chloride 0.9 % 100 mL IVPB    Order Specific Question:   Antibiotic Indication:    Answer:   Other Indication (list below)    Order Specific Question:   Other Indication:    Answer:   Unknown source   metroNIDAZOLE (FLAGYL) IVPB 500 mg    Order Specific Question:   Antibiotic Indication:    Answer:   Other Indication (list below)    Order Specific Question:   Other Indication:    Answer:   Unknown source   DISCONTD: vancomycin (VANCOCIN) IVPB 1000 mg/200 mL premix    Order Specific Question:  Indication:    Answer:   Other Indication (list below)    Order Specific Question:   Other Indication:    Answer:   Unknown source   lactated ringers bolus 1,000 mL   ondansetron (ZOFRAN) injection 4 mg   vancomycin (VANCOREADY) IVPB 1500 mg/300 mL    Order Specific Question:   Indication:    Answer:   Other Indication (list below)    Order Specific Question:   Other Indication:    Answer:   Unknown source   lactated ringers bolus 1,000 mL   magnesium sulfate IVPB 2 g 50 mL   potassium chloride 10 mEq in 100 mL IVPB   potassium chloride SA (KLOR-CON M) CR tablet 40 mEq   heparin injection 5,000 Units   lactated ringers infusion   OR Linked Order Group    acetaminophen (TYLENOL) tablet 650 mg    acetaminophen (TYLENOL) suppository 650 mg   pantoprazole (PROTONIX) injection 40 mg   hydrALAZINE (APRESOLINE) injection 5 mg   ceFEPIme (MAXIPIME) 2 g in sodium chloride 0.9 % 100 mL IVPB    Order Specific Question:   Antibiotic Indication:    Answer:   Sepsis   lactated ringers bolus 1,000 mL    Unresulted Labs (From admission,  onward)     Start     Ordered   01/26/22 0500  Procalcitonin  Tomorrow morning,   URGENT        01/25/22 2311   01/26/22 0500  Comprehensive metabolic panel  Tomorrow morning,   STAT        01/25/22 2311   01/26/22 0500  CBC  Tomorrow morning,   STAT        01/25/22 2311   01/26/22 0003  Type and screen  Once,   R        01/26/22 0002   01/25/22 2312  PSA  Once,   URGENT        01/25/22 2311   01/25/22 2311  Hemoglobin A1c  Add-on,   AD        01/25/22 2311   01/25/22 2308  HIV Antibody (routine testing w rflx)  (HIV Antibody (Routine testing w reflex) panel)  Once,   URGENT        01/25/22 2311   01/25/22 2026  Blood Culture (routine x 2)  (Septic presentation on arrival (screening labs, nursing and treatment orders for obvious sepsis))  BLOOD CULTURE X 2,   STAT      01/25/22 2026   01/25/22 2026  Urine Culture  (Septic presentation on arrival (screening labs, nursing and treatment orders for obvious sepsis))  ONCE - URGENT,   URGENT       Question:  Indication  Answer:  Sepsis   01/25/22 2026   Unscheduled  Occult blood card to lab, stool RN will collect  As needed,   R     Question:  Specimen to be collected by:  Answer:  RN will collect   01/26/22 0002             Admission Imaging : CT ABDOMEN PELVIS WO CONTRAST  Result Date: 01/26/2022 CLINICAL DATA:  Kidney failure, acute severe sepsis EXAM: CT ABDOMEN AND PELVIS WITHOUT CONTRAST TECHNIQUE: Multidetector CT imaging of the abdomen and pelvis was performed following the standard protocol without IV contrast. RADIATION DOSE REDUCTION: This exam was performed according to the departmental dose-optimization program which includes automated exposure control, adjustment of the mA and/or kV according to patient size and/or  use of iterative reconstruction technique. COMPARISON:  None Available. FINDINGS: Lower chest: Partially visualized ascending thoracic aorta enlarged in caliber up to at least 4.4 cm. Coronary artery  calcification. Bilateral lower lobe subsegmental atelectasis. Possible trace right pleural effusion. Likely tiny hiatal hernia. Hepatobiliary: The liver is enlarged measuring up to 20 cm. The hepatic parenchyma is diffusely hypodense compared to the splenic parenchyma consistent with fatty infiltration. No focal liver abnormality. No gallstones, gallbladder wall thickening, or pericholecystic fluid. No biliary dilatation. Pancreas: No focal lesion. Normal pancreatic contour. No surrounding inflammatory changes. No main pancreatic ductal dilatation. Spleen: Normal in size without focal abnormality. Adrenals/Urinary Tract: No adrenal nodule bilaterally. Nonspecific bilateral perinephric stranding. No nephrolithiasis and no hydronephrosis. No definite contour-deforming renal mass. No ureterolithiasis or hydroureter. The urinary bladder is distended with marked circumferential urinary bladder wall thickening. Stomach/Bowel: Stomach is within normal limits. No evidence of bowel wall thickening or dilatation. Colonic diverticulosis. Likely appendectomy. Vascular/Lymphatic: No abdominal aorta or iliac aneurysm. Mild atherosclerotic plaque of the aorta and its branches. No abdominal, pelvic, or inguinal lymphadenopathy. Reproductive: Prostate is enlarged measuring up to 4.7 cm. Other: There is a 1.9 x 1.9 cm well-circumscribed centrally calcified lesion within the right lower. Appears similar to a finding previously visualized within the pelvis, possibly migrated. Another similar subcentimeter finding noted within the pelvis (6:42). No intraperitoneal free fluid. No intraperitoneal free gas. No organized fluid collection. Musculoskeletal: No abdominal wall hernia or abnormality. No suspicious lytic or blastic osseous lesions. No acute displaced fracture. Thoracic osteophyte formation. Intervertebral disc space narrowing osteophyte formation at the L5-S1 level. IMPRESSION: 1. Marked circumferential urinary bladder wall  thickening. Recommend clinical correlation with urinalysis for infection. Consider direct visualization. 2. Prostatomegaly. 3. Hepatomegaly and hepatic steatosis. 4. Colonic diverticulosis with no acute diverticulitis. 5. Likely tiny hiatal hernia. 6. Aneurysmal ascending thoracic aorta-partially visualized (4.4 cm). Recommend annual imaging followup by CTA or MRA. This recommendation follows 2010 ACCF/AHA/AATS/ACR/ASA/SCA/SCAI/SIR/STS/SVM Guidelines for the Diagnosis and Management of Patients with Thoracic Aortic Disease. Circulation. 2010; 121: F681-E751. Aortic aneurysm NOS (ICD10-I71.9). 7. Aortic Atherosclerosis (ICD10-I70.0) including coronary calcification. 8. Indeterminate 1.9 x 1.9 cm well-circumscribed centrally calcified lesion within the right lower. Finding appears similar to a lesion previously visualized within the pelvis, possibly migrated. Another similar subcentimeter calcified lesion within the pelvis. Finding likely benign. No definite findings to suggest carcinoid. Electronically Signed   By: Iven Finn M.D.   On: 01/26/2022 00:14   CT HEAD WO CONTRAST (5MM)  Result Date: 01/26/2022 CLINICAL DATA:  weakness EXAM: CT HEAD WITHOUT CONTRAST TECHNIQUE: Contiguous axial images were obtained from the base of the skull through the vertex without intravenous contrast. RADIATION DOSE REDUCTION: This exam was performed according to the departmental dose-optimization program which includes automated exposure control, adjustment of the mA and/or kV according to patient size and/or use of iterative reconstruction technique. COMPARISON:  CT head 02/05/2020, MRI head 05/22/2013 BRAIN: BRAIN Cerebral ventricle sizes are concordant with the degree of cerebral volume loss. Patchy and confluent areas of decreased attenuation are noted throughout the deep and periventricular white matter of the cerebral hemispheres bilaterally, compatible with chronic microvascular ischemic disease. Chronic lacunar  infarction of the right thalamus and posterior limb of the internal capsule. No evidence of large-territorial acute infarction. No parenchymal hemorrhage. No mass lesion. No extra-axial collection. No mass effect or midline shift. No hydrocephalus. Basilar cisterns are patent. Vascular: No hyperdense vessel. Atherosclerotic calcifications are present within the cavernous internal carotid arteries. Skull: No acute fracture or focal lesion.  Sinuses/Orbits: Left maxillary sinus partially visualized polypoid like mucosal thickening. Otherwise remaining visualized paranasal sinuses and mastoid air cells are clear. The orbits are unremarkable. Other: None. IMPRESSION: No acute intracranial abnormality. Electronically Signed   By: Iven Finn M.D.   On: 01/26/2022 00:02   DG Chest Port 1 View  Result Date: 01/25/2022 CLINICAL DATA:  Questionable sepsis EXAM: PORTABLE CHEST 1 VIEW COMPARISON:  02/05/2020 FINDINGS: Low lung volumes. Heart and mediastinal contours are within normal limits. No focal opacities or effusions. No acute bony abnormality. IMPRESSION: No active cardiopulmonary disease.  Low volumes. Electronically Signed   By: Rolm Baptise M.D.   On: 01/25/2022 20:51      Physical Examination: Vitals:   01/25/22 2100 01/25/22 2130 01/25/22 2200 01/25/22 2330  BP: 127/89 (!) 122/92 126/86 (!) 144/99  Pulse: (!) 104 (!) 104 (!) 105 (!) 104  Temp:      Resp: _0 (!) 21  Height:      Weight:      SpO2: 98% 94% 96% 95%  TempSrc:      BMI (Calculated):       Physical Exam Vitals and nursing note reviewed.  Constitutional:      General: He is not in acute distress.    Appearance: He is ill-appearing and diaphoretic. He is not toxic-appearing.  HENT:     Head: Normocephalic and atraumatic.     Right Ear: Hearing and external ear normal.     Left Ear: Hearing and external ear normal.     Nose: Nose normal. No nasal deformity.     Mouth/Throat:     Lips: Pink.     Mouth: Mucous  membranes are dry.     Tongue: No lesions.  Eyes:     Extraocular Movements: Extraocular movements intact.  Neck:     Vascular: No carotid bruit.  Cardiovascular:     Rate and Rhythm: Regular rhythm. Tachycardia present.     Pulses: Normal pulses.     Heart sounds: Murmur heard.  Pulmonary:     Effort: Pulmonary effort is normal.     Breath sounds: Normal breath sounds.  Abdominal:     General: Bowel sounds are normal. There is no distension.     Palpations: Abdomen is soft. There is no mass.     Tenderness: There is no abdominal tenderness. There is no guarding.     Hernia: No hernia is present.  Musculoskeletal:        General: No swelling.     Right lower leg: No edema.     Left lower leg: No edema.  Skin:    General: Skin is warm.  Neurological:     General: No focal deficit present.     Mental Status: He is alert and oriented to person, place, and time.     Cranial Nerves: Cranial nerves 2-12 are intact. No dysarthria or facial asymmetry.     Motor: Weakness present.  Psychiatric:        Attention and Perception: Attention normal.        Mood and Affect: Mood normal.        Speech: Speech normal.        Behavior: Behavior normal. Behavior is cooperative.        Cognition and Memory: Cognition normal.     Assessment and Plan: * Generalized weakness Attribute to Uti and severe sepsis. Pt has symptoms for several weeks and has not seeked treatment.  Fall precaution. Aspiration precaution.  STR on d/c.    Abnormal chest CT Concern for TAA and need for repeat imaging from previous ct. Will defer to dayteam to d/w patient and wife once stable   Abnormal CT of the abdomen Will defer to dayteam to d/w wife and paitient once stable.  IMPRESSION: 1. Marked circumferential urinary bladder wall thickening. Recommend clinical correlation with urinalysis for infection. Consider direct visualization. 2. Prostatomegaly. 3. Hepatomegaly and hepatic steatosis. 4. Colonic  diverticulosis with no acute diverticulitis. 5. Likely tiny hiatal hernia. 6. Aneurysmal ascending thoracic aorta-partially visualized (4.4 cm). Recommend annual imaging followup by CTA or MRA. This recommendation follows 2010 ACCF/AHA/AATS/ACR/ASA/SCA/SCAI/SIR/STS/SVM Guidelines for the Diagnosis and Management of Patients with Thoracic Aortic Disease. Circulation. 2010; 121: E563-J497. Aortic aneurysm NOS (ICD10-I71.9). 7. Aortic Atherosclerosis (ICD10-I70.0) including coronary calcification. 8. Indeterminate 1.9 x 1.9 cm well-circumscribed centrally calcified lesion within the right lower. Finding appears similar to a lesion previously visualized within the pelvis, possibly migrated. Another similar subcentimeter calcified lesion within the pelvis. Finding likely benign. No definite findings to suggest carcinoid.  Troponin level elevated Attribute to demand ischemia. We will cont and follow.  2 D Echo as deemed appropriate;   Abnormal finding on EKG EKG: sinus tachycardia, PAC's noted, Q waves in III, prolonged QT interval@ 610 , LVH.   Previous EKG :    Lacunar infarction ([place]) Pt has history of  lacunar infarct on imaging head ct in 2021, that is when he lost his vision in his right eye.   Electrolyte abnormality    Latest Ref Rng & Units 01/25/2022    8:26 PM 11/21/2020    4:20 PM 02/08/2020    2:14 PM  CMP  Glucose 70 - 99 mg/dL 129  115    BUN 8 - 23 mg/dL 15  10    Creatinine 0.61 - 1.24 mg/dL 1.39  0.54    Sodium 135 - 145 mmol/L 144  128    Potassium 3.5 - 5.1 mmol/L 2.4  2.8  3.2   Chloride 98 - 111 mmol/L 105  88    CO2 22 - 32 mmol/L 16  22    Calcium 8.9 - 10.3 mg/dL 7.7  7.0    Total Protein 6.5 - 8.1 g/dL 6.9     Total Bilirubin 0.3 - 1.2 mg/dL 2.6     Alkaline Phos 38 - 126 U/L 73     AST 15 - 41 U/L 224     ALT 0 - 44 U/L 53     We will replace and follow levels.   Transaminitis    Latest Ref Rng & Units 01/25/2022    8:26 PM 02/06/2020     3:36 AM 10/17/2019    8:19 AM  Hepatic Function  Total Protein 6.5 - 8.1 g/dL 6.9  5.2    5.0  7.8   Albumin 3.5 - 5.0 g/dL 3.8  3.2    3.1  4.9   AST 15 - 41 U/L 224  74    70  50   ALT 0 - 44 U/L 53  43    42  46   Alk Phosphatase 38 - 126 U/L 73  47    46  58   Total Bilirubin 0.3 - 1.2 mg/dL 2.6  3.7    3.5  4.3   Bilirubin, Direct 0.0 - 0.2 mg/dL  1.1     Due to severe sepsis and bordering septic shock even though bp  has remained normal his elevated lactic  is concerning for hypoperfusion and shock.   Anemia    Latest Ref Rng & Units 01/25/2022    8:26 PM 11/21/2020    4:20 PM 02/07/2020    3:46 AM  CBC  WBC 4.0 - 10.5 K/uL 2.5  5.8  3.6   Hemoglobin 13.0 - 17.0 g/dL 13.6  12.8  10.6   Hematocrit 39.0 - 52.0 % 39.0  35.6  28.4   Platelets 150 - 400 K/uL 111  127  89     Hb of 13.6 is erroneous due to dehydration.  Suspect pt is still anemic and we will do type/screen/cross if needed.  Occult./ protonix. Npo except for sips with meds after swallow.    Lactic acidosis Due to severe sepsis. Suspect he will develop septic shock and we will cont with MIVF with LR at 100 cc hourly.   AKI (acute kidney injury) ([place]) Lab Results  Component Value Date   CREATININE 1.39 (H) 01/25/2022   CREATININE 0.54 (L) 11/21/2020   CREATININE 0.55 (L) 02/08/2020  Due to UTI. Cont with iv abx.  Ct abd/pelvis without contrast.  Urine c/s and PSA. Evaluate for post obstructive etiology with ct abd and pelvis ? Hydronephrosis.  Temp sensing foley and strict I/O.      Sepsis secondary to UTI [facility]) Due to UTI/ Prostatitis. Check PSA. Ct abd / pelvis.  PRN sepsis orders. Indwelling foley for post obstructive. Vitals:   01/25/22 2100 01/25/22 2130 01/25/22 2200 01/25/22 2330  BP: 127/89 (!) 122/92 126/86 (!) 144/99  Pulse: (!) 104 (!) 104 (!) 105 (!) 104  Temp:      Resp: _0 (!) 21  Height:      Weight:      SpO2: 98% 94% 96% 95%  TempSrc:      BMI (Calculated):        SpO2: 95 % O2 Flow Rate (L/min): 3 L/min   Current Facility-Administered Medications:    acetaminophen (TYLENOL) tablet 650 mg, 650 mg, Oral, Q6H PRN **OR** acetaminophen (TYLENOL) suppository 650 mg, 650 mg, Rectal, Q6H PRN, Para Skeans, MD   [START ON 01/26/2022] ceFEPIme (MAXIPIME) 2 g in sodium chloride 0.9 % 100 mL IVPB, 2 g, Intravenous, Q12H, Rhandi Despain V, MD   heparin injection 5,000 Units, 5,000 Units, Subcutaneous, Q8H, Kamau Weatherall V, MD   hydrALAZINE (APRESOLINE) injection 5 mg, 5 mg, Intravenous, Q4H PRN, Para Skeans, MD   [START ON 01/26/2022] lactated ringers bolus 1,000 mL, 1,000 mL, Intravenous, Once, Florina Ou V, MD   lactated ringers infusion, , Intravenous, Continuous, Deandre Stansel V, MD   magnesium sulfate IVPB 2 g 50 mL, 2 g, Intravenous, Once, Florina Ou V, MD, Last Rate: 50 mL/hr at 01/25/22 2319, 2 g at 01/25/22 2319   pantoprazole (PROTONIX) injection 40 mg, 40 mg, Intravenous, Q12H, Symphoni Helbling V, MD   potassium chloride 10 mEq in 100 mL IVPB, 10 mEq, Intravenous, Q1 Hr x 4, Lazaria Schaben V, MD, Last Rate: 100 mL/hr at 01/25/22 2245, 10 mEq at 01/25/22 2245   potassium chloride SA (KLOR-CON M) CR tablet 40 mEq, 40 mEq, Oral, Once, Para Skeans, MD   vancomycin (VANCOREADY) IVPB 1500 mg/300 mL, 1,500 mg, Intravenous, Once, Darrick Penna, [facility], Last Rate: 150 mL/hr at 01/25/22 2221, 1,500 mg at 01/25/22 2221  Current Outpatient Medications:    lisinopril (ZESTRIL) 10 MG tablet, Take 10 mg by mouth daily. , Disp: , Rfl:    potassium chloride 20 MEQ  TBCR, Take 20 mEq by mouth 2 (two) times daily for 7 days., Disp: 14 tablet, Rfl: 0    Essential hypertension Vitals:   01/25/22 2024 01/25/22 2045 01/25/22 2100 01/25/22 2130  BP: (!) 158/95 131/86 127/89 (!) 122/92   01/25/22 2200  BP: 126/86  Hold home BP meds. PRN hydralazine at low dose dont expect he will need it.     DVT prophylaxis:  Heparin    Code Status:  Full Code    Family  Communication:  Wife at bedside.   MARKEY, DEADY (Spouse) [phone]     Disposition Plan:  STR  Consults called:  None   Admission status: Impatient    Unit/ Expected LOS: 5- 7 days. / stepdown .    Para Skeans MD [facility]  6 PM- 2 AM. Please contact me via secure Chat 6 PM-2 AM. [phone] ( Pager ) To contact the [facility] Attending or Consulting provider [place] or covering provider during after hours [place], for this patient.   Check the care team in [facility] and look for a) attending/consulting [facility] provider listed and b) the [facility] team listed Log into [email or web address] and use [place]'s universal password to access. If you do not have the password, please contact the hospital operator. Locate the [facility] provider you are looking for under [facility] and page to a number that you can be directly reached. If you still have difficulty reaching the provider, please page the [facility] (Director on Call) for the Hospitalists listed on amion for assistance. [email or web address] 01/26/2022, 12:59 AM

## 2022-01-25 NOTE — Sepsis Progress Note (Signed)
Following for sepsis monitoring ?

## 2022-01-25 NOTE — Code Documentation (Signed)
CODE SEPSIS - PHARMACY COMMUNICATION  **Broad Spectrum Antibiotics should be administered within 1 hour of Sepsis diagnosis**  Time Code Sepsis Called/Page Received: 2026  Antibiotics Ordered: cefepime, vancomycin  Time of 1st antibiotic administration: 2056   Selinda Eon ,PharmD Clinical Pharmacist  01/25/2022  8:38 PM

## 2022-01-25 NOTE — Assessment & Plan Note (Signed)
Vitals:   01/25/22 2024 01/25/22 2045 01/25/22 2100 01/25/22 2130  BP: (!) 158/95 131/86 127/89 (!) 122/92   01/25/22 2200  BP: 126/86  Hold home BP meds. PRN hydralazine at low dose dont expect he will need it.

## 2022-01-25 NOTE — Assessment & Plan Note (Signed)
Due to severe sepsis. Suspect he will develop septic shock and we will cont with MIVF with LR at 100 cc hourly.

## 2022-01-25 NOTE — Consult Note (Signed)
PHARMACY -  BRIEF ANTIBIOTIC NOTE   Pharmacy has received consult(s) for vancomycin and cefepime from an ED provider.  The patient's profile has been reviewed for ht/wt/allergies/indication/available labs.    One time order(s) placed for vancomycin 1500mg  IV x1 and Cefepime 2g IV x1  Further antibiotics/pharmacy consults should be ordered by admitting physician if indicated.                       Thank you, 01/25/2022  8:38 PM

## 2022-01-25 NOTE — Assessment & Plan Note (Addendum)
Lab Results  Component Value Date   CREATININE 1.39 (H) 01/25/2022   CREATININE 0.54 (L) 11/21/2020   CREATININE 0.55 (L) 02/08/2020  Due to UTI. Cont with iv abx.  Ct abd/pelvis without contrast.  Urine c/s and PSA. Evaluate for post obstructive etiology with ct abd and pelvis ? Hydronephrosis.  Temp sensing foley and strict I/O.

## 2022-01-25 NOTE — H&P (Incomplete)
History and Physical    Chief Complaint: Generalized weakness.   HISTORY OF PRESENT ILLNESS: Elijah Murphy is an 66 y.o. male coming for generalized weakness. He has had UTI for several weeks and does not want to go to doctor.  Pt has htn and has had a  lacunar infarct based on previous imaging.  Sometimes he leans to his left when he is ill and it resolves spontaneously.  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      Pt has PMH as below: Past Medical History:  Diagnosis Date  . E. coli infection   . Hyperlipidemia 08/10/2016  . Hypertension      Review of Systems  Neurological:  Positive for dizziness and weakness.      No Known Allergies   Past Surgical History:  Procedure Laterality Date  . APPENDECTOMY    . KNEE SURGERY Left   . TONSILLECTOMY        Social History   Socioeconomic History  . Marital status: Married    Spouse name: Not on file  . Number of children: Not on file  . Years of education: Not on file  . Highest education level: Not on file  Occupational History  . Not on file  Tobacco Use  . Smoking status: Never  . Smokeless tobacco: Current    Types: Chew  Vaping Use  . Vaping Use: Never used  Substance and Sexual Activity  . Alcohol use: Yes    Comment: occasionally  . Drug use: No  . Sexual activity: Not on file  Other Topics Concern  . Not on file  Social History Narrative  . Not on file   Social Determinants of Health   Financial Resource Strain: Not on file  Food Insecurity: Not on file  Transportation Needs: Not on file  Physical Activity: Not on file  Stress: Not on file  Social Connections: Not on file      CURRENT MEDS:    Current Facility-Administered Medications (Cardiovascular):  .  hydrALAZINE (APRESOLINE) injection 5 mg  Current Outpatient Medications (Cardiovascular):  .  lisinopril (ZESTRIL) 10 MG tablet, Take 10 mg by mouth daily.     Current  Facility-Administered Medications (Analgesics):  .  acetaminophen (TYLENOL) tablet 650 mg **OR** acetaminophen (TYLENOL) suppository 650 mg   Current Facility-Administered Medications (Hematological):  .  heparin injection 5,000 Units   Current Facility-Administered Medications (Other):  .  ceFEPIme (MAXIPIME) 2 g in sodium chloride 0.9 % 100 mL IVPB .  lactated ringers bolus 1,000 mL .  lactated ringers infusion .  magnesium sulfate IVPB 2 g 50 mL .  pantoprazole (PROTONIX) injection 40 mg .  potassium chloride 10 mEq in 100 mL IVPB .  potassium chloride SA (KLOR-CON M) CR tablet 40 mEq .  vancomycin (VANCOREADY) IVPB 1500 mg/300 mL  Current Outpatient Medications (Other):  .  potassium chloride 20 MEQ TBCR, Take 20 mEq by mouth 2 (two) times daily for 7 days.    ED Course: Pt in Ed *** Vitals:   01/25/22 2100 01/25/22 2130 01/25/22 2200 01/25/22 2330  BP: 127/89 (!) 122/92 126/86 (!) 144/99  Pulse: (!) 104 (!) 104 (!) 105 (!) 104  Resp: _0 (!) 21  Temp:      TempSrc:      SpO2: 98% 94% 96% 95%  Weight:      Height:       Total I/O In: 2200 [IV  [id number]] Out: -  SpO2: 95 % O2 Flow Rate (L/min): 3 L/min Blood work in ed shows  Results for orders placed or performed during the hospital encounter of 01/25/22 (from the past 48 hour(s))  Lactic acid, plasma     Status: Abnormal   Collection Time: 01/25/22  8:26 PM  Result Value Ref Range   Lactic Acid, Venous >9.0 (HH) 0.5 - 1.9 mmol/L    Comment: CRITICAL RESULT CALLED TO, READ BACK BY AND VERIFIED WITH ALEX ROHR_0  01/25/22 RH Performed at [facility], [address] [place], [place] [address]   Comprehensive metabolic panel     Status: Abnormal   Collection Time: 01/25/22  8:26 PM  Result Value Ref Range   Sodium 144 135 - 145 mmol/L    Comment: ELECTROLYTES REPEATED TO VERIFY RH   Potassium 2.4 (LL) 3.5 - 5.1 mmol/L    Comment: CRITICAL RESULT CALLED TO, READ BACK BY AND VERIFIED  WITH ALEX ROHR_1  01/25/22 RH    Chloride 105 98 - 111 mmol/L   CO2 16 (L) 22 - 32 mmol/L   Glucose, Bld 129 (H) 70 - 99 mg/dL    Comment: Glucose reference range applies only to samples taken after fasting for at least 8 hours.   BUN 15 8 - 23 mg/dL   Creatinine, Ser 1.39 (H) 0.61 - 1.24 mg/dL   Calcium 7.7 (L) 8.9 - 10.3 mg/dL   Total Protein 6.9 6.5 - 8.1 g/dL   Albumin 3.8 3.5 - 5.0 g/dL   AST 224 (H) 15 - 41 U/L   ALT 53 (H) 0 - 44 U/L   Alkaline Phosphatase 73 38 - 126 U/L   Total Bilirubin 2.6 (H) 0.3 - 1.2 mg/dL   GFR, Estimated 56 (L) >60 mL/min    Comment: (NOTE) Calculated using the CKD-EPI Creatinine Equation (2021)    Anion gap 23 (H) 5 - 15    Comment: Performed at [facility], [address] [place], [place] [address]  CBC with Differential     Status: Abnormal   Collection Time: 01/25/22  8:26 PM  Result Value Ref Range   WBC 2.5 (L) 4.0 - 10.5 K/uL   RBC 3.91 (L) 4.22 - 5.81 MIL/uL   Hemoglobin 13.6 13.0 - 17.0 g/dL   HCT 39.0 39.0 - 52.0 %   MCV 99.7 80.0 - 100.0 fL   MCH 34.8 (H) 26.0 - 34.0 pg   MCHC 34.9 30.0 - 36.0 g/dL   RDW 13.2 11.5 - 15.5 %   Platelets 111 (L) 150 - 400 K/uL   nRBC 0.0 0.0 - 0.2 %   Neutrophils Relative % 89 %   Neutro Abs 2.2 1.7 - 7.7 K/uL   Lymphocytes Relative 7 %   Lymphs Abs 0.2 (L) 0.7 - 4.0 K/uL   Monocytes Relative 1 %   Monocytes Absolute 0.0 (L) 0.1 - 1.0 K/uL   Eosinophils Relative 1 %   Eosinophils Absolute 0.0 0.0 - 0.5 K/uL   Basophils Relative 1 %   Basophils Absolute 0.0 0.0 - 0.1 K/uL   Immature Granulocytes 1 %   Abs Immature Granulocytes 0.03 0.00 - 0.07 K/uL    Comment: Performed at [facility], [address] [place], [place] [address]  Protime-INR     Status: Abnormal   Collection Time: 01/25/22  8:26 PM  Result Value Ref Range   Prothrombin Time 15.6 (H) 11.4 - 15.2 seconds   INR 1.3 (H) 0.8 - 1.2  Comment: (NOTE) INR goal varies based on device and disease  states. Performed at [facility], [address] [place], [place] [address]   APTT     Status: None   Collection Time: 01/25/22  8:26 PM  Result Value Ref Range   aPTT 26 24 - 36 seconds    Comment: Performed at [facility], [address] [place], [place] [address]  Lipase, blood     Status: None   Collection Time: 01/25/22  8:26 PM  Result Value Ref Range   Lipase 45 11 - 51 U/L    Comment: Performed at [facility], [place], [place] [address]  Troponin I (High Sensitivity)     Status: Abnormal   Collection Time: 01/25/22  8:26 PM  Result Value Ref Range   Troponin I (High Sensitivity) 112 (HH) <18 ng/L    Comment: CRITICAL RESULT CALLED TO, READ BACK BY AND VERIFIED WITH ALEX ROHR_0  01/25/22 RH (NOTE) Elevated high sensitivity troponin I (hsTnI) values and significant  changes across serial measurements may suggest ACS but many other  chronic and acute conditions are known to elevate hsTnI results.  Refer to the "Links" section for chest pain algorithms and additional  guidance. Performed at [facility], [address] [place], [place] [address]   Resp Panel by RT-PCR (Flu A&B, Covid) Anterior Nasal Swab     Status: None   Collection Time: 01/25/22  8:37 PM   Specimen: Anterior Nasal Swab  Result Value Ref Range   SARS Coronavirus 2 by RT PCR NEGATIVE NEGATIVE    Comment: (NOTE) SARS-CoV-2 target nucleic acids are NOT DETECTED.  The SARS-CoV-2 RNA is generally detectable in upper respiratory specimens during the acute phase of infection. The lowest concentration of SARS-CoV-2 viral copies this assay can detect is 138 copies/mL. A negative result does not preclude SARS-Cov-2 infection and should not be used as the sole basis for treatment or other patient management decisions. A negative result may occur with  improper specimen collection/handling, submission of specimen other than nasopharyngeal swab,  presence of viral mutation(s) within the areas targeted by this assay, and inadequate number of viral copies(<138 copies/mL). A negative result must be combined with clinical observations, patient history, and epidemiological information. The expected result is Negative.  Fact Sheet for Patients:  EntrepreneurPulse.com.au  Fact Sheet for Healthcare Providers:  IncredibleEmployment.be  This test is no t yet approved or cleared by the [place] FDA and  has been authorized for detection and/or diagnosis of SARS-CoV-2 by FDA under an Emergency Use Authorization (EUA). This EUA will remain  in effect (meaning this test can be used) for the duration of the COVID-19 declaration under Section 564(b)(1) of the Act, 21 U.S.C.section 360bbb-3(b)(1), unless the authorization is terminated  or revoked sooner.       Influenza A by PCR NEGATIVE NEGATIVE   Influenza B by PCR NEGATIVE NEGATIVE    Comment: (NOTE) The Xpert Xpress SARS-CoV-2/FLU/RSV plus assay is intended as an aid in the diagnosis of influenza from Nasopharyngeal swab specimens and should not be used as a sole basis for treatment. Nasal washings and aspirates are unacceptable for Xpert Xpress SARS-CoV-2/FLU/RSV testing.  Fact Sheet for Patients: EntrepreneurPulse.com.au  Fact Sheet for Healthcare Providers: IncredibleEmployment.be  This test is not yet approved or cleared by the [place] FDA and has been authorized for detection and/or diagnosis of SARS-CoV-2 by FDA under an Emergency Use Authorization (EUA). This EUA will remain in effect (  meaning this test can be used) for the duration of the COVID-19 declaration under Section 564(b)(1) of the Act, 21 U.S.C. section 360bbb-3(b)(1), unless the authorization is terminated or revoked.  Performed at [facility], [address] [place], [place] [address]   Urinalysis, Complete w  Microscopic Urine, Catheterized     Status: Abnormal   Collection Time: 01/25/22  8:37 PM  Result Value Ref Range   Color, Urine AMBER (A) YELLOW    Comment: BIOCHEMICALS MAY BE AFFECTED BY COLOR   APPearance TURBID (A) CLEAR   Specific Gravity, Urine 1.013 1.005 - 1.030   pH 5.0 5.0 - 8.0   Glucose, UA NEGATIVE NEGATIVE mg/dL   Hgb urine dipstick MODERATE (A) NEGATIVE   Bilirubin Urine NEGATIVE NEGATIVE   Ketones, ur NEGATIVE NEGATIVE mg/dL   Protein, ur 100 (A) NEGATIVE mg/dL   Nitrite NEGATIVE NEGATIVE   Leukocytes,Ua LARGE (A) NEGATIVE   RBC / HPF >50 (H) 0 - 5 RBC/hpf   WBC, UA >50 (H) 0 - 5 WBC/hpf   Bacteria, UA MANY (A) NONE SEEN   Squamous Epithelial / LPF 0-5 0 - 5   WBC Clumps PRESENT    Mucus PRESENT    Budding Yeast PRESENT    Granular Casts, UA PRESENT    Ca Oxalate Crys, UA PRESENT     Comment: Performed at [facility], [address] [place], [place] [address]  Lactic acid, plasma     Status: Abnormal   Collection Time: 01/25/22 10:49 PM  Result Value Ref Range   Lactic Acid, Venous 7.0 (HH) 0.5 - 1.9 mmol/L    Comment: CRITICAL VALUE NOTED. VALUE IS CONSISTENT WITH PREVIOUSLY REPORTED/CALLED VALUE RH Performed at [facility], [place], [place] [address]   Troponin I (High Sensitivity)     Status: Abnormal   Collection Time: 01/25/22 10:49 PM  Result Value Ref Range   Troponin I (High Sensitivity) 153 (HH) <18 ng/L    Comment: CRITICAL VALUE NOTED. VALUE IS CONSISTENT WITH PREVIOUSLY REPORTED/CALLED VALUE RH (NOTE) Elevated high sensitivity troponin I (hsTnI) values and significant  changes across serial measurements may suggest ACS but many other  chronic and acute conditions are known to elevate hsTnI results.  Refer to the "Links" section for chest pain algorithms and additional  guidance. Performed at [facility], [address] [place], [place] [address]   Magnesium     Status: Abnormal   Collection  Time: 01/25/22 10:49 PM  Result Value Ref Range   Magnesium 0.9 (LL) 1.7 - 2.4 mg/dL    Comment: CRITICAL RESULT CALLED TO, READ BACK BY AND VERIFIED WITH ALEX ROHR_0  01/25/22 RH Performed at [facility], [address] [place],  [address]     In Ed pt received  Meds ordered this encounter  Medications  . ceFEPIme (MAXIPIME) 2 g in sodium chloride 0.9 % 100 mL IVPB    Order Specific Question:   Antibiotic Indication:    Answer:   Other Indication (list below)    Order Specific Question:   Other Indication:    Answer:   Unknown source  . metroNIDAZOLE (FLAGYL) IVPB 500 mg    Order Specific Question:   Antibiotic Indication:    Answer:   Other Indication (list below)    Order Specific Question:   Other Indication:    Answer:   Unknown source  . DISCONTD: vancomycin (VANCOCIN) IVPB 1000 mg/200 mL premix    Order Specific Question:  Indication:    Answer:   Other Indication (list below)    Order Specific Question:   Other Indication:    Answer:   Unknown source  . lactated ringers bolus 1,000 mL  . ondansetron (ZOFRAN) injection 4 mg  . vancomycin (VANCOREADY) IVPB 1500 mg/300 mL    Order Specific Question:   Indication:    Answer:   Other Indication (list below)    Order Specific Question:   Other Indication:    Answer:   Unknown source  . lactated ringers bolus 1,000 mL  . magnesium sulfate IVPB 2 g 50 mL  . potassium chloride 10 mEq in 100 mL IVPB  . potassium chloride SA (KLOR-CON M) CR tablet 40 mEq  . heparin injection 5,000 Units  . lactated ringers infusion  . OR Linked Order Group   . acetaminophen (TYLENOL) tablet 650 mg   . acetaminophen (TYLENOL) suppository 650 mg  . pantoprazole (PROTONIX) injection 40 mg  . hydrALAZINE (APRESOLINE) injection 5 mg  . ceFEPIme (MAXIPIME) 2 g in sodium chloride 0.9 % 100 mL IVPB    Order Specific Question:   Antibiotic Indication:    Answer:   Sepsis  . lactated ringers bolus 1,000 mL    Unresulted Labs  (From admission, onward)     Start     Ordered   01/26/22 0500  Procalcitonin  Tomorrow morning,   URGENT        01/25/22 2311   01/26/22 0500  Comprehensive metabolic panel  Tomorrow morning,   STAT        01/25/22 2311   01/26/22 0500  CBC  Tomorrow morning,   STAT        01/25/22 2311   01/26/22 0003  Type and screen  Once,   R        01/26/22 0002   01/26/22 0003  CK  Add-on,   AD        01/26/22 0002   01/25/22 2312  PSA  Once,   URGENT        01/25/22 2311   01/25/22 2311  Hemoglobin A1c  Add-on,   AD        01/25/22 2311   01/25/22 2308  HIV Antibody (routine testing w rflx)  (HIV Antibody (Routine testing w reflex) panel)  Once,   URGENT        01/25/22 2311   01/25/22 2026  Blood Culture (routine x 2)  (Septic presentation on arrival (screening labs, nursing and treatment orders for obvious sepsis))  BLOOD CULTURE X 2,   STAT      01/25/22 2026   01/25/22 2026  Urine Culture  (Septic presentation on arrival (screening labs, nursing and treatment orders for obvious sepsis))  ONCE - URGENT,   URGENT       Question:  Indication  Answer:  Sepsis   01/25/22 2026   Unscheduled  Occult blood card to lab, stool RN will collect  As needed,   R     Question:  Specimen to be collected by:  Answer:  RN will collect   01/26/22 0002             Admission Imaging : [facility] Chest Port 1 View  Result Date: 01/25/2022 CLINICAL DATA:  Questionable sepsis EXAM: PORTABLE CHEST 1 VIEW COMPARISON:  02/05/2020 FINDINGS: Low lung volumes. Heart and mediastinal contours are within normal limits. No focal opacities or effusions. No acute bony abnormality. IMPRESSION: No active cardiopulmonary disease.  Low volumes.  Electronically Signed   By: Rolm Baptise M.D.   On: 01/25/2022 20:51      Physical Examination: Vitals:   01/25/22 2100 01/25/22 2130 01/25/22 2200 01/25/22 2330  BP: 127/89 (!) 122/92 126/86 (!) 144/99  Pulse: (!) 104 (!) 104 (!) 105 (!) 104  Temp:      Resp: _0 (!) 21   Height:      Weight:      SpO2: 98% 94% 96% 95%  TempSrc:      BMI (Calculated):       Physical Exam Vitals and nursing note reviewed.  Constitutional:      General: He is not in acute distress.    Appearance: He is ill-appearing and diaphoretic. He is not toxic-appearing.  HENT:     Head: Normocephalic and atraumatic.     Right Ear: Hearing and external ear normal.     Left Ear: Hearing and external ear normal.     Nose: Nose normal. No nasal deformity.     Mouth/Throat:     Lips: Pink.     Mouth: Mucous membranes are dry.     Tongue: No lesions.  Eyes:     Extraocular Movements: Extraocular movements intact.  Neck:     Vascular: No carotid bruit.  Cardiovascular:     Rate and Rhythm: Regular rhythm. Tachycardia present.     Pulses: Normal pulses.     Heart sounds: Murmur heard.  Pulmonary:     Effort: Pulmonary effort is normal.     Breath sounds: Normal breath sounds.  Abdominal:     General: Bowel sounds are normal. There is no distension.     Palpations: Abdomen is soft. There is no mass.     Tenderness: There is no abdominal tenderness. There is no guarding.     Hernia: No hernia is present.  Musculoskeletal:        General: No swelling.     Right lower leg: No edema.     Left lower leg: No edema.  Skin:    General: Skin is warm.  Neurological:     General: No focal deficit present.     Mental Status: He is alert and oriented to person, place, and time.     Cranial Nerves: Cranial nerves 2-12 are intact. No dysarthria or facial asymmetry.     Motor: Weakness present.  Psychiatric:        Attention and Perception: Attention normal.        Mood and Affect: Mood normal.        Speech: Speech normal.        Behavior: Behavior normal. Behavior is cooperative.        Cognition and Memory: Cognition normal.     Assessment and Plan: * Generalized weakness Attribute to Uti and severe sepsis. Pt has symptoms for several weeks and has not seeked treatment.   Fall precaution. Aspiration precaution. STR on d/c.    Lacunar infarction ([place]) Pt has history of  lacunar infarct on imaging head ct in 2021, that is when he lost his vision in his right eye.   Electrolyte abnormality    Latest Ref Rng & Units 01/25/2022    8:26 PM 11/21/2020    4:20 PM 02/08/2020    2:14 PM  CMP  Glucose 70 - 99 mg/dL 129  115    BUN 8 - 23 mg/dL 15  10    Creatinine 0.61 - 1.24 mg/dL 1.39  0.54  Sodium 135 - 145 mmol/L 144  128    Potassium 3.5 - 5.1 mmol/L 2.4  2.8  3.2   Chloride 98 - 111 mmol/L 105  88    CO2 22 - 32 mmol/L 16  22    Calcium 8.9 - 10.3 mg/dL 7.7  7.0    Total Protein 6.5 - 8.1 g/dL 6.9     Total Bilirubin 0.3 - 1.2 mg/dL 2.6     Alkaline Phos 38 - 126 U/L 73     AST 15 - 41 U/L 224     ALT 0 - 44 U/L 53     We will replace and follow levels.   Transaminitis    Latest Ref Rng & Units 01/25/2022    8:26 PM 02/06/2020    3:36 AM 10/17/2019    8:19 AM  Hepatic Function  Total Protein 6.5 - 8.1 g/dL 6.9  5.2    5.0  7.8   Albumin 3.5 - 5.0 g/dL 3.8  3.2    3.1  4.9   AST 15 - 41 U/L 224  74    70  50   ALT 0 - 44 U/L 53  43    42  46   Alk Phosphatase 38 - 126 U/L 73  47    46  58   Total Bilirubin 0.3 - 1.2 mg/dL 2.6  3.7    3.5  4.3   Bilirubin, Direct 0.0 - 0.2 mg/dL  1.1     Due to severe sepsis and bordering septic shock even though bp  has remained normal his elevated lactic is concerning for hypoperfusion and shock.   Anemia    Latest Ref Rng & Units 01/25/2022    8:26 PM 11/21/2020    4:20 PM 02/07/2020    3:46 AM  CBC  WBC 4.0 - 10.5 K/uL 2.5  5.8  3.6   Hemoglobin 13.0 - 17.0 g/dL 13.6  12.8  10.6   Hematocrit 39.0 - 52.0 % 39.0  35.6  28.4   Platelets 150 - 400 K/uL 111  127  89     Hb of 13.6 is erroneous due to dehydration.  Suspect pt is still anemic and we will do type/screen/cross if needed.  Occult./ protonix. Npo except for sips with meds after swallow.    Lactic acidosis Due to severe  sepsis. Suspect he will develop septic shock and we will cont with MIVF with LR at 100 cc hourly.   AKI (acute kidney injury) ([place]) Lab Results  Component Value Date   CREATININE 1.39 (H) 01/25/2022   CREATININE 0.54 (L) 11/21/2020   CREATININE 0.55 (L) 02/08/2020  Due to UTI. Cont with iv abx.  Ct abd/pelvis without contrast.  Urine c/s and PSA. Evaluate for post obstructive etiology with ct abd and pelvis ? Hydronephrosis.  Temp sensing foley and strict I/O.      Sepsis secondary to UTI [facility]) Due to UTI/ Prostatitis. Check PSA. Ct abd / pelvis.  PRN sepsis orders. Indwelling foley for post obstructive. Vitals:   01/25/22 2100 01/25/22 2130 01/25/22 2200 01/25/22 2330  BP: 127/89 (!) 122/92 126/86 (!) 144/99  Pulse: (!) 104 (!) 104 (!) 105 (!) 104  Temp:      Resp: _0 (!) 21  Height:      Weight:      SpO2: 98% 94% 96% 95%  TempSrc:      BMI (Calculated):       SpO2: 95 %  O2 Flow Rate (L/min): 3 L/min   Current Facility-Administered Medications:  .  acetaminophen (TYLENOL) tablet 650 mg, 650 mg, Oral, Q6H PRN **OR** acetaminophen (TYLENOL) suppository 650 mg, 650 mg, Rectal, Q6H PRN, Para Skeans, MD .  Derrill Memo ON 01/26/2022] ceFEPIme (MAXIPIME) 2 g in sodium chloride 0.9 % 100 mL IVPB, 2 g, Intravenous, Q12H, Maiah Sinning V, MD .  heparin injection 5,000 Units, 5,000 Units, Subcutaneous, Q8H, Hendry Speas V, MD .  hydrALAZINE (APRESOLINE) injection 5 mg, 5 mg, Intravenous, Q4H PRN, Para Skeans, MD .  Derrill Memo ON 01/26/2022] lactated ringers bolus 1,000 mL, 1,000 mL, Intravenous, Once, [place]ina Ou V, MD .  lactated ringers infusion, , Intravenous, Continuous, Jaiveer Panas V, MD .  magnesium sulfate IVPB 2 g 50 mL, 2 g, Intravenous, Once, [place]ina Ou V, MD, Last Rate: 50 mL/hr at 01/25/22 2319, 2 g at 01/25/22 2319 .  pantoprazole (PROTONIX) injection 40 mg, 40 mg, Intravenous, Q12H, Kordelia Severin V, MD .  potassium chloride 10 mEq in 100 mL IVPB, 10 mEq,  Intravenous, Q1 Hr x 4, Francely Craw V, MD, Last Rate: 100 mL/hr at 01/25/22 2245, 10 mEq at 01/25/22 2245 .  potassium chloride SA (KLOR-CON M) CR tablet 40 mEq, 40 mEq, Oral, Once, [place]ina Ou V, MD .  vancomycin (VANCOREADY) IVPB 1500 mg/300 mL, 1,500 mg, Intravenous, Once, Darrick Penna, [facility], Last Rate: 150 mL/hr at 01/25/22 2221, 1,500 mg at 01/25/22 2221  Current Outpatient Medications:  .  lisinopril (ZESTRIL) 10 MG tablet, Take 10 mg by mouth daily. , Disp: , Rfl:  .  potassium chloride 20 MEQ TBCR, Take 20 mEq by mouth 2 (two) times daily for 7 days., Disp: 14 tablet, Rfl: 0    Essential hypertension Vitals:   01/25/22 2024 01/25/22 2045 01/25/22 2100 01/25/22 2130  BP: (!) 158/95 131/86 127/89 (!) 122/92   01/25/22 2200  BP: 126/86  Hold home BP meds. PRN hydralazine at low dose dont expect he will need it.              DVT prophylaxis:  ***   Code Status:  ***   Family Communication:  ***   Disposition Plan:  DEYVI, BONANNO (Spouse) [phone]     Consults called:  ***  Admission status: ***   Unit/ Expected LOS: ***   Para Skeans MD [facility]  6 PM- 2 AM. Please contact me via secure Chat 6 PM-2 AM. [phone] ( Pager ) To contact the [facility] Attending or Consulting provider [place] or covering provider during after hours [place], for this patient.   Check the care team in [facility] and look for a) attending/consulting [facility] provider listed and b) the [facility] team listed Log into [email or web address] and use [place] universal password to access. If you do not have the password, please contact the hospital operator. Locate the [facility] provider you are looking for under [facility] and page to a number that you can be directly reached. If you still have difficulty reaching the provider, please page the [facility] (Director on Call) for the Hospitalists listed on amion for assistance. [email or web address] 01/26/2022, 12:04 AM

## 2022-01-25 NOTE — Assessment & Plan Note (Addendum)
Latest Ref Rng & Units 01/25/2022    8:26 PM 02/06/2020    3:36 AM 10/17/2019    8:19 AM  Hepatic Function  Total Protein 6.5 - 8.1 g/dL 6.9  5.2    5.0  7.8   Albumin 3.5 - 5.0 g/dL 3.8  3.2    3.1  4.9   AST 15 - 41 U/L 224  74    70  50   ALT 0 - 44 U/L 53  43    42  46   Alk Phosphatase 38 - 126 U/L 73  47    46  58   Total Bilirubin 0.3 - 1.2 mg/dL 2.6  3.7    3.5  4.3   Bilirubin, Direct 0.0 - 0.2 mg/dL  1.1     Due to severe sepsis and bordering septic shock even though bp  has remained normal his elevated lactic is concerning for hypoperfusion and shock.

## 2022-01-26 DIAGNOSIS — R7989 Other specified abnormal findings of blood chemistry: Secondary | ICD-10-CM | POA: Diagnosis present

## 2022-01-26 DIAGNOSIS — R9389 Abnormal findings on diagnostic imaging of other specified body structures: Secondary | ICD-10-CM | POA: Diagnosis present

## 2022-01-26 DIAGNOSIS — R531 Weakness: Secondary | ICD-10-CM | POA: Diagnosis not present

## 2022-01-26 DIAGNOSIS — R9431 Abnormal electrocardiogram [ECG] [EKG]: Secondary | ICD-10-CM | POA: Diagnosis present

## 2022-01-26 DIAGNOSIS — R935 Abnormal findings on diagnostic imaging of other abdominal regions, including retroperitoneum: Secondary | ICD-10-CM | POA: Diagnosis present

## 2022-01-26 LAB — COMPREHENSIVE METABOLIC PANEL
ALT: 43 U/L (ref 0–44)
AST: 139 U/L — ABNORMAL HIGH (ref 15–41)
Albumin: 3.3 g/dL — ABNORMAL LOW (ref 3.5–5.0)
Alkaline Phosphatase: 43 U/L (ref 38–126)
Anion gap: 15 (ref 5–15)
BUN: 15 mg/dL (ref 8–23)
CO2: 19 mmol/L — ABNORMAL LOW (ref 22–32)
Calcium: 7.6 mg/dL — ABNORMAL LOW (ref 8.9–10.3)
Chloride: 107 mmol/L (ref 98–111)
Creatinine, Ser: 1.19 mg/dL (ref 0.61–1.24)
GFR, Estimated: >60 mL/min (ref >60)
Glucose, Bld: 119 mg/dL — ABNORMAL HIGH (ref 70–99)
Potassium: 3.6 mmol/L (ref 3.5–5.1)
Sodium: 141 mmol/L (ref 135–145)
Total Bilirubin: 2.1 mg/dL — ABNORMAL HIGH (ref 0.3–1.2)
Total Protein: 6 g/dL — ABNORMAL LOW (ref 6.5–8.1)

## 2022-01-26 LAB — BLOOD CULTURE ID PANEL (REFLEXED) - BCID2

## 2022-01-26 LAB — LACTIC ACID, PLASMA: Lactic Acid, Venous: 4.3 mmol/L (ref 0.5–1.9)

## 2022-01-26 LAB — CBC
HCT: 31.4 % — ABNORMAL LOW (ref 39.0–52.0)
Hemoglobin: 11.1 g/dL — ABNORMAL LOW (ref 13.0–17.0)
MCH: 35.5 pg — ABNORMAL HIGH (ref 26.0–34.0)
MCHC: 35.4 g/dL (ref 30.0–36.0)
MCV: 100.3 fL — ABNORMAL HIGH (ref 80.0–100.0)
Platelets: 51 10*3/uL — ABNORMAL LOW (ref 150–400)
RBC: 3.13 MIL/uL — ABNORMAL LOW (ref 4.22–5.81)
RDW: 13.2 % (ref 11.5–15.5)
WBC: 4.9 10*3/uL (ref 4.0–10.5)
nRBC: 0 % (ref 0.0–0.2)

## 2022-01-26 LAB — GLUCOSE, CAPILLARY: Glucose-Capillary: 133 mg/dL — ABNORMAL HIGH (ref 70–99)

## 2022-01-26 LAB — HIV ANTIBODY (ROUTINE TESTING W REFLEX): HIV Screen 4th Generation wRfx: NONREACTIVE

## 2022-01-26 LAB — CK: Total CK: 98 U/L (ref 49–397)

## 2022-01-26 LAB — PSA: Prostatic Specific Antigen: 55.38 ng/mL — ABNORMAL HIGH (ref 0.00–4.00)

## 2022-01-26 LAB — TYPE AND SCREEN
ABO/RH(D): A POS
Antibody Screen: NEGATIVE

## 2022-01-26 LAB — PROCALCITONIN: Procalcitonin: 87.04 ng/mL

## 2022-01-26 LAB — MRSA NEXT GEN BY PCR, NASAL: MRSA by PCR Next Gen: NOT DETECTED

## 2022-01-26 LAB — ACETAMINOPHEN LEVEL: Acetaminophen (Tylenol), Serum: <10 ug/mL — ABNORMAL LOW (ref 10–30)

## 2022-01-26 MED ORDER — FOLIC ACID 1 MG PO TABS
1.0000 mg | ORAL_TABLET | Freq: Every day | ORAL | Status: DC
  Administered 2022-01-28 – 2022-01-30 (×3): 1 mg via ORAL
  Filled 2022-01-26 (×4): qty 1

## 2022-01-26 MED ORDER — SODIUM CHLORIDE 0.9 % IV SOLN
2.0000 g | INTRAVENOUS | Status: DC
  Administered 2022-01-26 – 2022-01-28 (×3): 2 g via INTRAVENOUS
  Filled 2022-01-26 (×3): qty 2
  Filled 2022-01-26: qty 20

## 2022-01-26 MED ORDER — LORAZEPAM 1 MG PO TABS: 1.0000 mg | ORAL_TABLET | ORAL | Status: AC | PRN

## 2022-01-26 MED ORDER — ORAL CARE MOUTH RINSE: 15.0000 mL | OROMUCOSAL | Status: DC | PRN

## 2022-01-26 MED ORDER — CHLORHEXIDINE GLUCONATE CLOTH 2 % EX PADS
6.0000 | MEDICATED_PAD | Freq: Every day | CUTANEOUS | Status: DC
  Administered 2022-01-26 – 2022-01-29 (×3): 6 via TOPICAL

## 2022-01-26 MED ORDER — SODIUM CHLORIDE 0.9 % IV SOLN
2.0000 g | Freq: Three times a day (TID) | INTRAVENOUS | Status: DC
  Filled 2022-01-26 (×2): qty 12.5

## 2022-01-26 MED ORDER — THIAMINE MONONITRATE 100 MG PO TABS
100.0000 mg | ORAL_TABLET | Freq: Every day | ORAL | Status: DC
  Administered 2022-01-28 – 2022-01-30 (×3): 100 mg via ORAL
  Filled 2022-01-26 (×4): qty 1

## 2022-01-26 MED ORDER — NICOTINE 14 MG/24HR TD PT24
14.0000 mg | MEDICATED_PATCH | Freq: Every day | TRANSDERMAL | Status: DC
  Administered 2022-01-26 – 2022-01-30 (×5): 14 mg via TRANSDERMAL
  Filled 2022-01-26 (×5): qty 1

## 2022-01-26 MED ORDER — LORAZEPAM 2 MG/ML IJ SOLN
1.0000 mg | INTRAMUSCULAR | Status: AC | PRN
  Administered 2022-01-26 (×2): 1 mg via INTRAVENOUS
  Administered 2022-01-26 – 2022-01-27 (×2): 2 mg via INTRAVENOUS
  Administered 2022-01-27: 4 mg via INTRAVENOUS
  Filled 2022-01-26: qty 2
  Filled 2022-01-26 (×4): qty 1

## 2022-01-26 MED ORDER — TRAMADOL HCL 50 MG PO TABS
50.0000 mg | ORAL_TABLET | Freq: Three times a day (TID) | ORAL | Status: DC | PRN
  Administered 2022-01-26 – 2022-01-29 (×2): 50 mg via ORAL
  Filled 2022-01-26 (×2): qty 1

## 2022-01-26 MED ORDER — ADULT MULTIVITAMIN W/MINERALS CH
1.0000 | ORAL_TABLET | Freq: Every day | ORAL | Status: DC
  Administered 2022-01-28 – 2022-01-30 (×3): 1 via ORAL
  Filled 2022-01-26 (×4): qty 1

## 2022-01-26 MED ORDER — THIAMINE HCL 100 MG/ML IJ SOLN
100.0000 mg | Freq: Every day | INTRAMUSCULAR | Status: DC
  Administered 2022-01-26: 100 mg via INTRAVENOUS
  Filled 2022-01-26: qty 2

## 2022-01-26 NOTE — Assessment & Plan Note (Signed)
Attribute to demand ischemia. We will cont and follow.  2 D Echo as deemed appropriate;

## 2022-01-26 NOTE — Assessment & Plan Note (Signed)
Concern for TAA and need for repeat imaging from previous ct. Will defer to dayteam to d/w patient and wife once stable

## 2022-01-26 NOTE — Assessment & Plan Note (Signed)
EKG: sinus tachycardia, PAC's noted, Q waves in III, prolonged QT interval@ 610 , LVH.   Previous EKG :

## 2022-01-26 NOTE — Progress Notes (Signed)
PHARMACY - PHYSICIAN COMMUNICATION CRITICAL VALUE ALERT - BLOOD CULTURE IDENTIFICATION (BCID)  Elijah Murphy is an 66 y.o. male w/ h/o HTN, HLD, h/o stroke, mild dementia who presented to [facility] on 01/25/2022 with genaralized weakness, lethargy, & dry heaving and c/o UTI symptoms for weeks PTA.  Assessment:  BCID call 1of4 GNR's (aerobic only) >> Ecoli (no resistances detected) strongly suspected urinary source.   Name of physician (or Provider) Contacted: Dr. Dayna Penafiel  Current antibiotics: cefepime 2g IV q8h  Changes to prescribed antibiotics recommended:   RPH - Would recommend narrowing to Ceftriaxone 2g IV q24h at next dosing interval. >> MD in agreement, will place orders.  Results for orders placed or performed during the hospital encounter of 01/25/22  Blood Culture ID Panel (Reflexed) (Collected: 01/25/2022  8:37 PM)  Result Value Ref Range   Enterococcus faecalis NOT DETECTED NOT DETECTED   Enterococcus Faecium NOT DETECTED NOT DETECTED   Listeria monocytogenes NOT DETECTED NOT DETECTED   Staphylococcus species NOT DETECTED NOT DETECTED   Staphylococcus aureus (BCID) NOT DETECTED NOT DETECTED   Staphylococcus epidermidis NOT DETECTED NOT DETECTED   Staphylococcus lugdunensis NOT DETECTED NOT DETECTED   Streptococcus species NOT DETECTED NOT DETECTED   Streptococcus agalactiae NOT DETECTED NOT DETECTED   Streptococcus pneumoniae NOT DETECTED NOT DETECTED   Streptococcus pyogenes NOT DETECTED NOT DETECTED   A.calcoaceticus-baumannii NOT DETECTED NOT DETECTED   Bacteroides fragilis NOT DETECTED NOT DETECTED   Enterobacterales DETECTED (A) NOT DETECTED   Enterobacter cloacae complex NOT DETECTED NOT DETECTED   Escherichia coli DETECTED (A) NOT DETECTED   Klebsiella aerogenes NOT DETECTED NOT DETECTED   Klebsiella oxytoca NOT DETECTED NOT DETECTED   Klebsiella pneumoniae NOT DETECTED NOT DETECTED   Proteus species NOT DETECTED NOT DETECTED   Salmonella species NOT  DETECTED NOT DETECTED   Serratia marcescens NOT DETECTED NOT DETECTED   Haemophilus influenzae NOT DETECTED NOT DETECTED   Neisseria meningitidis NOT DETECTED NOT DETECTED   Pseudomonas aeruginosa NOT DETECTED NOT DETECTED   Stenotrophomonas maltophilia NOT DETECTED NOT DETECTED   Candida albicans NOT DETECTED NOT DETECTED   Candida auris NOT DETECTED NOT DETECTED   Candida glabrata NOT DETECTED NOT DETECTED   Candida krusei NOT DETECTED NOT DETECTED   Candida parapsilosis NOT DETECTED NOT DETECTED   Candida tropicalis NOT DETECTED NOT DETECTED   Cryptococcus neoformans/gattii NOT DETECTED NOT DETECTED   CTX-M ESBL NOT DETECTED NOT DETECTED   Carbapenem resistance IMP NOT DETECTED NOT DETECTED   Carbapenem resistance KPC NOT DETECTED NOT DETECTED   Carbapenem resistance NDM NOT DETECTED NOT DETECTED   Carbapenem resist OXA 48 LIKE NOT DETECTED NOT DETECTED   Carbapenem resistance VIM NOT DETECTED NOT DETECTED    Elijah Murphy Elijah Murphy 01/26/2022  1:43 PM

## 2022-01-26 NOTE — Evaluation (Signed)
Clinical/Bedside Swallow Evaluation Patient Details  Name: Elijah Murphy MRN: [id number] Date of Birth: 03-21-1955  Today's Date: 01/26/2022 Time: SLP Start Time (ACUTE ONLY): 1115 SLP Stop Time (ACUTE ONLY): 1210 SLP Time Calculation (min) (ACUTE ONLY): 55 min  Past Medical History:  Past Medical History:  Diagnosis Date   E. 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  Per chart patient had "UTI symptoms" for several weeks but refused to go to MD.  In the ED appeared to be in severe sepsis-with tachycardia.   Per chart, pt has h/o GI/Esophageal issues in 10/2019 and 02/2020 c/b "circumferential wall thickening of the Esophagus which may be  secondary to Esophagitis. Consider evaluation with upper endoscopy" per Abd scan. Pt did not f/u w/ GI then.    Assessment / Plan / Recommendation  Clinical Impression   Pt seen for BSE today. Wife present in room. Pt awake, alert to name, in hospital. Decreased awareness re: self and environment present. Pt has Baseline Cognitive decline, Dementia per chart. He required MOD+ cues for follow through w/ basic instructions. Wife stated she Cuts his foods well at home at Baseline. Pt is on [place] O2 support, 3L. Afebrile, WBC wnl.  Wife reported pt has episodes of drinking large sips and drinking "fast" ("he drinks about 10 bottles of water a day") and intermittently has too much in his mouth, orally holds, and sometimes "coughs" after swallowing it. Suspect impact from Cognitive decline on the Timing and Coordination of managing Large sips of liquids; A-P transfer and swallow/clear.    Pt appears to present w/ grossly  adequate oropharyngeal phase swallow w/ No gross oropharyngeal phase dysphagia noted, No overt neuromuscular deficits impacting effectiveness of swallowing noted. However, pt demonstrated mild oral tremors during oral prep stage of sips/bites. Pt consumed po trials w/ No immediate, overt clinical s/s of aspiration during po trials. Pt appears at reduced risk for aspiration when following general aspiration precautions, using a slightly modified diet of moistened/cooked foods for ease of oral phase, and feeding assistance at meals.   However, pt does have challenging factors that could impact oropharyngeal swallowing to include Acute illness/hospitalization, weakness, and Baseline Cognitive decline, Dementia per chart/Wife. Pt is also somewhat dependent for feeding. These factors can increase risk for dysphagia, aspiration as well as decreased oral intake overall.   During po trials, pt consumed all consistencies w/ no overt coughing, decline in vocal quality, or change in respiratory presentation during/post trials. O2 sats remained 96%. Oral phase appeared grossly [facility] w/ timely bolus management, mastication, and control of bolus propulsion for A-P transfer for swallowing. Oral clearing achieved w/ all trial consistencies -- moistened, soft foods given. OM Exam appeared grossly [facility] w/ no unilateral weakness noted. Mild oral tremors noted. Speech intelligible. Pt fed self liquids by [employer] when drinking -- this decreases risk for aspiration to occur.   Recommend a more mech soft diet consistency w/ well-Cut meats, moistened foods; Thin liquids -- NO STRAWS. Pt should Help Hold Cup when drinking. Recommend general aspiration precautions including use of a Dysphagia drink cup for better flow control when drinking liquids.  Feeding Support but allow pt to feed self as much as possible -- Finger foods. Reduce distractions at meals. Pills CRUSHED vs Whole in Puree for safer, easier swallowing. Education given on  Pills in Puree; food consistencies and easy to eat options; general aspiration precautions to pt and Wife. NSG updated, agreed. MD updated. Recommend Dietician f/u for support. SLP Visit Diagnosis: Dysphagia, oral phase (R13.11) (mild)    Aspiration Risk  Mild aspiration risk;Risk for inadequate nutrition/hydration (reduced when following general aspiration precautions)    Diet Recommendation   mech soft diet consistency w/ well-Cut meats, moistened foods; Thin liquids -- NO STRAWS. Pt should Help Hold Cup when drinking. Recommend general aspiration precautions including use of a Dysphagia drink cup for better flow control when drinking liquids. Feeding Support but allow pt to feed self as much as possible -- Finger foods. Reduce distractions at meals.   Medication Administration: Crushed with puree (vs Whole in puree)    Other  Recommendations Recommended Consults:  (Dietician f/u; Neurology f/u for Dementia assessment) Oral Care Recommendations: Oral care BID;Oral care before and after PO;Staff/trained caregiver to provide oral care Other Recommendations:  (n/a)    Recommendations for follow up therapy are one component of a multi-disciplinary discharge planning process, led by the attending physician.  Recommendations may be updated based on patient status, additional functional criteria and insurance authorization.  Follow up Recommendations Follow physician's recommendations for discharge plan and follow up therapies      Assistance Recommended at Discharge  Frequent assistance -- feeding support at meals  Functional Status Assessment Patient has had a recent decline in their functional status and demonstrates the ability to make significant improvements in function in a reasonable and predictable amount of time.  Frequency and Duration min 1 x/week  1 week       Prognosis Prognosis for Safe Diet Advancement: Fair Barriers to Reach Goals: Cognitive deficits;Language deficits;Time  post onset;Severity of deficits;Behavior Barriers/Prognosis Comment: pt has Baseline Cognitive decline, Dementia per chart. Pt also relies on feeding support.      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  Per chart patient had "UTI symptoms" for several weeks but refused to go to MD.  In the ED appeared to be in severe sepsis-with tachycardia.   Per chart, pt has h/o GI/Esophageal issues in 10/2019 and 02/2020 c/b "circumferential wall thickening of the Esophagus which may be  secondary to Esophagitis. Consider evaluation with upper endoscopy" per Abd scan. Pt did not f/u w/ GI then. Type of Study: Bedside Swallow Evaluation Previous Swallow Assessment: none Diet Prior to this Study: NPO (regular diet at home which Wife "cuts up really well") Temperature Spikes Noted: No (wbc 4.9) Respiratory Status: Nasal cannula (3L) History of Recent Intubation: No Behavior/Cognition: Alert;Cooperative;Pleasant mood;Confused;Distractible;Requires cueing Oral Cavity Assessment: Dry (min) Oral Care Completed by SLP: Yes Oral Cavity - Dentition: Adequate natural dentition Vision: Functional for self-feeding Self-Feeding Abilities: Able to feed self;Needs assist;Needs set up (UE weakness and tremors -- takes Ativan for the tremors per Wife) Patient Positioning: Upright in bed (needed positioning support) Baseline Vocal Quality: Normal;Low vocal intensity (few verbalizations) Volitional Cough: Strong Volitional Swallow: Able to elicit    Oral/Motor/Sensory Function Overall Oral Motor/Sensory Function: Within functional limits   Ice Chips Ice chips: Within functional limits Presentation: Spoon (fed; 3 trials)  Thin Liquid Thin Liquid: Within functional limits (grossly) Presentation: Cup;Self Fed (12 trials) Other  Comments: Wife stated pt tended to take Large sips at home and "would get choked"; she also endorsed oral holding/pocketing when he did this -- suspect impact from Cognitive decline on the oral phase, A-P transfer coordination. Also noted mild oral tremors.    Nectar Thick Nectar Thick Liquid: Not tested   Honey Thick Honey Thick Liquid: Not tested   Puree Puree: Within functional limits Presentation: Spoon (fed; 8 trials)   Solid     Solid: Within functional limits (grossly) Presentation: Self Fed (8 trials)        Jerilynn Som, MS, CCC-SLP Speech Language Pathologist Rehab Services; [facility] -  [phone] (ascom) Briseyda Fehr 01/26/2022,2:18 PM

## 2022-01-26 NOTE — Assessment & Plan Note (Signed)
Will defer to dayteam to d/w wife and paitient once stable.  IMPRESSION: 1. Marked circumferential urinary bladder wall thickening. Recommend clinical correlation with urinalysis for infection. Consider direct visualization. 2. Prostatomegaly. 3. Hepatomegaly and hepatic steatosis. 4. Colonic diverticulosis with no acute diverticulitis. 5. Likely tiny hiatal hernia. 6. Aneurysmal ascending thoracic aorta-partially visualized (4.4 cm). Recommend annual imaging followup by CTA or MRA. This recommendation follows 2010 ACCF/AHA/AATS/ACR/ASA/SCA/SCAI/SIR/STS/SVM Guidelines for the Diagnosis and Management of Patients with Thoracic Aortic Disease. Circulation. 2010; 121: P379-K240. Aortic aneurysm NOS (ICD10-I71.9). 7. Aortic Atherosclerosis (ICD10-I70.0) including coronary calcification. 8. Indeterminate 1.9 x 1.9 cm well-circumscribed centrally calcified lesion within the right lower. Finding appears similar to a lesion previously visualized within the pelvis, possibly migrated. Another similar subcentimeter calcified lesion within the pelvis. Finding likely benign. No definite findings to suggest carcinoid.

## 2022-01-26 NOTE — TOC Initial Note (Signed)
Transition of Care [facility]) - Initial/Assessment Note    Patient Details  Name: Elijah Murphy MRN: [id number] Date of Birth: 10-31-1955  Transition of Care [facility]) CM/SW Contact:    Shelbie Hutching, RN Phone Number: 01/26/2022, 2:27 PM  Clinical Narrative:                  Patient admitted to the hospital with sepsis with acute renal failure.  RNCM met with patient at the bedside.  Patient reports that he lives at home with his wife.  He is independent at home, he has a rollator that he uses occasionally.  Patient has a license but his wife provides most of the transportation.   Patient denies substance abuse resources. He will agree to [facility] at discharge if recommended.   He does not think he has a current PCP, looks like he saw Dr. Baldemar Lenis last December.  May need to be set up with new PCP.   Expected Discharge Plan: [place] Barriers to Discharge: Continued Medical Work up   Patient Goals and CMS Choice Patient states their goals for this hospitalization and ongoing recovery are:: patient wants to get better CMS Medicare.gov Compare Post Acute Care list provided to:: Patient Choice offered to / list presented to : Patient  Expected Discharge Plan and Services Expected Discharge Plan: [place]   Discharge Planning Services: CM Consult Post Acute Care Choice: [place] arrangements for the past 2 months: Single Family Home                 DME Arranged: N/A DME Agency: NA       HH Arranged: PT, OT          Prior Living Arrangements/Services Living arrangements for the past 2 months: Single Family Home Lives with:: Spouse Patient language and need for interpreter reviewed:: Yes Do you feel safe going back to the place where you live?: Yes      Need for Family Participation in Patient Care: Yes (Comment) Care giver support system in place?: Yes (comment) Current home services: DME (rollator) Criminal Activity/Legal Involvement  Pertinent to Current Situation/Hospitalization: No - Comment as needed  Activities of Daily Living      Permission Sought/Granted Permission sought to share information with : Case Manager, Family Supports Permission granted to share information with : Yes, Verbal Permission Granted  Share Information with NAME: Fares Ramthun  Permission granted to share info w AGENCY: [place] granted to share info w Relationship: spouse  Permission granted to share info w Contact Information: [phone]  Emotional Assessment Appearance:: Appears stated age Attitude/Demeanor/Rapport: Engaged Affect (typically observed): Accepting Orientation: : Oriented to Self, Oriented to Place, Oriented to  Time, Oriented to Situation Alcohol / Substance Use: Alcohol Use Psych Involvement: No (comment)  Admission diagnosis:  Acute cystitis without hematuria [N30.00] Severe sepsis ([place]) [A41.9, R65.20] Sepsis with acute renal failure without septic shock, due to unspecified organism, unspecified acute renal failure type ([place]) [A41.9, R65.20, N17.9] Patient Active Problem List   Diagnosis Date Noted   Abnormal finding on EKG 01/26/2022   Troponin level elevated 01/26/2022   Abnormal CT of the abdomen 01/26/2022   Abnormal chest CT 01/26/2022   Generalized weakness 01/25/2022   Transaminitis 01/25/2022   Electrolyte abnormality 01/25/2022   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   Hematemesis with nausea    Diarrhea    Acute gastroenteritis    Anemia    Sepsis secondary  to UTI ([place]) 02/05/2020   AKI (acute kidney injury) ([place]) 02/05/2020   Lactic acidosis 80/69/9967   Acute metabolic encephalopathy 22/77/3750   History of UTI 02/05/2020   Essential hypertension 08/10/2016   PCP:  Patient, No Pcp Per Pharmacy:   [id number] [id number]: [phone]: [phone] [id number] [place], [place]  [id number]: [phone]: [phone]   Social Determinants of Health (SDOH) Interventions    Readmission Risk Interventions     No data to display

## 2022-01-26 NOTE — TOC Progression Note (Signed)
Transition of Care [facility]) - Progression Note    Patient Details  Name: Elijah Murphy MRN: [id number] Date of Birth: March 16, 1955  Transition of Care [facility]) CM/SW Contact  Allayne Butcher, RN Phone Number: 01/26/2022, 2:47 PM  Clinical Narrative:     [place] [facility] accepted referral for [facility] PT.   Expected Discharge Plan: Home w Home Health Services Barriers to Discharge: Continued Medical Work up  Expected Discharge Plan and Services Expected Discharge Plan: Home w Home Health Services   Discharge Planning Services: CM Consult Post Acute Care Choice: Home Health Living arrangements for the past 2 months: Single Family Home                 DME Arranged: N/A DME Agency: NA       HH Arranged: PT, OT           Social Determinants of Health (SDOH) Interventions    Readmission Risk Interventions     No data to display

## 2022-01-26 NOTE — Progress Notes (Signed)
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  Per chart patient had "UTI symptoms" for several weeks but refused to go to Dr. In the ED appeared to be in severe sepsis-with tachycardia up to 129 tachypneic 23 blood pressure 130s to 150s systolic, 81% on room air. Cxr no pneumonia, UA abnormal LE large, WBC more than 50, RBC more than 50,.  Labs with hypokalemia metabolic acidosis lactic acidosis >9, creatinine 1.3 AST 224/ALT 53, Elevated troponin 153>112.  CBC with leukopenia 2.5> 4.9 thrombocytopenia 111 > 51.  Blood culture urine culture sent given vancomycin and cefepime, aggressive IV fluid hydration and admitted to ICU.  Subjective: Seen and examined this am wife at bedside Alert awake oriented to people place, year. Appears tremulous Per wife he is not social does not want to talk to people and does not go out He drinks 4-6 oz liquor bourbon daily, last etoh was Wednesday  Back is bothering him On 2l [place]   Assessment and Plan:  Severe sepsis POA UTI: Patient with severe sepsis presumed source UTI chest x-ray no pneumonia.  Significant lactic acid 1.9 S/p fluid resuscitation, blood pressure currently stable, lactic acid downtrending as below.  Continue cefepime, Foley catheter, follow-up blood culture. Recent Labs  Lab 01/25/22 2026 01/25/22 2249 01/26/22 0147 01/26/22 0830  WBC 2.5*  --  4.9  --   LATICACIDVEN >9.0* 7.0*  --  4.3*  PROCALCITON  --   --  87.04  --    Alcohol abuse last drink on Wednesday drinks liquor every day.  Added CIWA scale Ativan due to tremulousness, continue thiamine folate.  Cough while taking po/eating:speech eval requested, was generally eating at home w/o issues pta Per wife    Thrombocytopenia: On admission 11, likely due to patient's alcohol abuse liver dysfunction, noted drop further in the setting of sepsis, monitor closely avoid Lovenox/heparin products Recent Labs  Lab 01/25/22 2026 01/26/22 0147  PLT 111* 51*  Leukopenia resolved.  Monitor  Metabolic acidosis/lactic acidosis:lactate improving, repeat in the morning to document resolution, some of lactic acidosis is likely contributed by patient's hepatic dysfunction/alcohol abuse Hypokalemia: Resolved Anemia: Hemoglobin on admission 13.6 likely hemoconcentrated at 11.1 this morning.  Monitor  Transaminitis Hepatic steatosis: Likely due to sepsis as well as patient's alcohol abuse.  AST significantly than ALT, downtrending, TB 2.1.  Monitor  Elevated troponin 112>113: Suspect demand ischemia in the setting of patient's sepsis.  Check echocardiogram  Hypertension: Not taking any medication at home.  Monitor Hyperlipidemia: Not on meds at home. Mild dementia: Mentation at baseline  Indeterminate 1.9 X1.9 centrally calcified lesion in the  pelvis: Discussed with patient and patient's wife at bedside, will need close follow-up.  Abnormal CT chest with aneurysmal ascending thoracic aorta-partially visualized (4.4cm). Radiology recommend annual imaging followup by CTA or MRA.  Tobacco abuse: addeed nicotine patch.  Deconditioning/debility: PT OT evaluation once able  Goals of care: Currently full code  DVT prophylaxis: Place and maintain sequential compression device Start: 01/26/22 0745 SCDs Start: 01/25/22 2309 Code Status:   Code Status: Full Code Family Communication: plan of care discussed with patient/wife at bedside. Patient status is: Patient because of severe sepsis  Level of care: Stepdown  Dispo: The patient is from: home wi wife  Anticipated disposition: TBD 3-4 DAYS Objective: Vitals last 24 hrs: Vitals:   01/26/22 0700 01/26/22 0800 01/26/22 0900 01/26/22 1000  BP:  (!) 155/96 (!) 152/89  (!) 157/89  Pulse: 93 91 (!) 104 97  Resp: 20 (!) 27 (!) 31 20  Temp: 99 F (37.2 C) 99 F (37.2 C) 98.8 F (37.1 C) 98.8 F (37.1 C)  TempSrc: Bladder Bladder  Bladder  SpO2: 99% 96% 96% 97%  Weight:      Height:       Weight change:   Physical Examination: General exam: alert awake, older than stated age HEENT:Oral mucosa moist, Ear/Nose WNL grossly Respiratory system: bilaterally CLEAR BS, no use of accessory muscle Cardiovascular system: S1 & S2 +, No JVD. Gastrointestinal system: Abdomen soft,NT,ND, BS+ Nervous System:Alert, awake, moving extremities. Extremities: LE edema NEG,distal peripheral pulses palpable.  Skin: No rashes,no icterus. MSK: Normal muscle bulk,tone, power  Medications reviewed:  Scheduled Meds:  Chlorhexidine Gluconate Cloth  6 each Topical Q0600   folic acid  1 mg Oral Daily   multivitamin with minerals  1 tablet Oral Daily   nicotine  14 mg Transdermal Daily   pantoprazole (PROTONIX) IV  40 mg Intravenous Q12H   potassium chloride  40 mEq Oral Once   thiamine  100 mg Oral Daily   Or   thiamine  100 mg Intravenous Daily   Continuous Infusions:  ceFEPime (MAXIPIME) IV Stopped (01/26/22 0819)   lactated ringers 100 mL/hr at 01/26/22 1000     Diet Order             Diet NPO time specified Except for: Sips with Meds  Diet effective now                   Intake/Output Summary (Last 24 hours) at 01/26/2022 1052 Last data filed at 01/26/2022 1000 Gross per 24 hour  Intake 4718.5 ml  Output 350 ml  Net 4368.5 ml   Net IO Since Admission: 4,368.5 mL [01/26/22 1052]  Wt Readings from Last 3 Encounters:  01/26/22 75.8 kg  11/21/20 72.6 kg  02/06/20 80.9 kg     Unresulted Labs (From admission, onward)     Start     Ordered   01/27/22 0500  Comprehensive metabolic panel  Tomorrow morning,   R       Question:  Specimen collection method  Answer:  Lab=Lab collect   01/26/22 0753   01/27/22 0500  CBC  Tomorrow  morning,   R       Question:  Specimen collection method  Answer:  Lab=Lab collect   01/26/22 0753   01/25/22 2311  Hemoglobin A1c  Add-on,   AD        01/25/22 2311   01/25/22 2026  Urine Culture  (Septic presentation on arrival (screening labs, nursing and treatment orders for obvious sepsis))  ONCE - URGENT,   URGENT       Question:  Indication  Answer:  Sepsis   01/25/22 2026          Data Reviewed: I have personally reviewed following labs and imaging studies CBC: Recent Labs  Lab 01/25/22 2026 01/26/22 0147  WBC 2.5* 4.9  NEUTROABS 2.2  --   HGB 13.6 11.1*  HCT 39.0 31.4*  MCV 99.7 100.3*  PLT 111* 51*   Basic Metabolic Panel: Recent Labs  Lab 01/25/22 2026 01/25/22 2249 01/26/22 0147  NA 144  --  141  K 2.4*  --  3.6  CL 105  --  107  CO2 16*  --  19*  GLUCOSE 129*  --  119*  BUN 15  --  15  CREATININE 1.39*  --  1.19  CALCIUM 7.7*  --  7.6*  MG  --  0.9*  --    GFR: Estimated Creatinine Clearance: 61.1 mL/min (by C-G formula based on SCr of 1.19 mg/dL). Liver Function Tests: Recent Labs  Lab 01/25/22 2026 01/26/22 0147  AST 224* 139*  ALT 53* 43  ALKPHOS 73 43  BILITOT 2.6* 2.1*  PROT 6.9 6.0*  ALBUMIN 3.8 3.3*   Recent Results (from the past 240 hour(s))  Resp Panel by RT-PCR (Flu A&B, Covid) Anterior Nasal Swab     Status: None   Collection Time: 01/25/22  8:37 PM   Specimen: Anterior Nasal Swab  Result Value Ref Range Status   SARS Coronavirus 2 by RT PCR NEGATIVE NEGATIVE Final    Comment: (NOTE) SARS-CoV-2 target nucleic acids are NOT DETECTED.  The SARS-CoV-2 RNA is generally detectable in upper respiratory specimens during the acute phase of infection. The lowest concentration of SARS-CoV-2 viral copies this assay can detect is 138 copies/mL. A negative result does not preclude SARS-Cov-2 infection and should not be used as the sole basis for treatment or other patient management decisions. A negative result may occur with   improper specimen collection/handling, submission of specimen other than nasopharyngeal swab, presence of viral mutation(s) within the areas targeted by this assay, and inadequate number of viral copies(<138 copies/mL). A negative result must be combined with clinical observations, patient history, and epidemiological information. The expected result is Negative.  Fact Sheet for Patients:  BloggerCourse.com  Fact Sheet for Healthcare Providers:  SeriousBroker.it  This test is no t yet approved or cleared by the [place] FDA and  has been authorized for detection and/or diagnosis of SARS-CoV-2 by FDA under an Emergency Use Authorization (EUA). This EUA will remain  in effect (meaning this test can be used) for the duration of the COVID-19 declaration under Section 564(b)(1) of the Act, 21 U.S.C.section 360bbb-3(b)(1), unless the authorization is terminated  or revoked sooner.       Influenza A by PCR NEGATIVE NEGATIVE Final   Influenza B by PCR NEGATIVE NEGATIVE Final    Comment: (NOTE) The Xpert Xpress SARS-CoV-2/FLU/RSV plus assay is intended as an aid in the diagnosis of influenza from Nasopharyngeal swab specimens and should not be used as a sole basis for treatment. Nasal washings and aspirates are unacceptable for Xpert Xpress SARS-CoV-2/FLU/RSV testing.  Fact Sheet for Patients: BloggerCourse.com  Fact Sheet for Healthcare Providers: SeriousBroker.it  This test is not yet approved or cleared by the [place] FDA and has been authorized for detection and/or diagnosis of SARS-CoV-2 by FDA under an Emergency Use Authorization (EUA). This EUA will remain in effect (meaning this test can be used) for the duration of the COVID-19 declaration under Section 564(b)(1) of the Act, 21 U.S.C. section 360bbb-3(b)(1), unless the authorization is terminated  or revoked.  Performed at [facility], [address] [place], [place] [address]   Blood Culture (routine x 2)     Status: None (Preliminary result)   Collection Time: 01/25/22  8:37 PM   Specimen: Right Antecubital; Blood  Result Value Ref Range Status   Specimen Description RIGHT ANTECUBITAL  Final   Special Requests   Final    BOTTLES DRAWN AEROBIC AND ANAEROBIC Blood Culture results may not be optimal due to an  excessive volume of blood received in culture bottles   Culture   Final    NO GROWTH < 12 HOURS Performed at [facility], [address] [place], [place] [address]    Report Status PENDING  Incomplete  Blood Culture (routine x 2)     Status: None (Preliminary result)   Collection Time: 01/25/22  8:37 PM   Specimen: BLOOD RIGHT HAND  Result Value Ref Range Status   Specimen Description BLOOD RIGHT HAND  Final   Special Requests   Final    BOTTLES DRAWN AEROBIC AND ANAEROBIC Blood Culture adequate volume   Culture   Final    NO GROWTH < 12 HOURS Performed at [facility], [address] [place], [place] [address]    Report Status PENDING  Incomplete  MRSA Next Gen by PCR, Nasal     Status: None   Collection Time: 01/26/22  1:06 AM   Specimen: Nasal Mucosa; Nasal Swab  Result Value Ref Range Status   MRSA by PCR Next Gen NOT DETECTED NOT DETECTED Final    Comment: (NOTE) The GeneXpert MRSA Assay (FDA approved for NASAL specimens only), is one component of a comprehensive MRSA colonization surveillance program. It is not intended to diagnose MRSA infection nor to guide or monitor treatment for MRSA infections. Test performance is not FDA approved in patients less than 69 years old. Performed at [facility], [address] [place], [place] [address]     Antimicrobials: Anti-infectives (From admission, onward)    Start     Dose/Rate Route Frequency Ordered Stop   01/26/22 0900  ceFEPIme (MAXIPIME) 2 g in sodium  chloride 0.9 % 100 mL IVPB        2 g 200 mL/hr over 30 Minutes Intravenous Every 12 hours 01/25/22 2316     01/25/22 2030  ceFEPIme (MAXIPIME) 2 g in sodium chloride 0.9 % 100 mL IVPB        2 g 200 mL/hr over 30 Minutes Intravenous  Once 01/25/22 2026 01/25/22 2125   01/25/22 2030  metroNIDAZOLE (FLAGYL) IVPB 500 mg        500 mg 100 mL/hr over 60 Minutes Intravenous  Once 01/25/22 2026 01/25/22 2244   01/25/22 2030  vancomycin (VANCOCIN) IVPB 1000 mg/200 mL premix  Status:  Discontinued        1,000 mg 200 mL/hr over 60 Minutes Intravenous  Once 01/25/22 2026 01/25/22 2029   01/25/22 2030  vancomycin (VANCOREADY) IVPB 1500 mg/300 mL        1,500 mg 150 mL/hr over 120 Minutes Intravenous  Once 01/25/22 2029 01/26/22 0105      Culture/Microbiology    Component Value Date/Time   SDES RIGHT ANTECUBITAL 01/25/2022 2037   SDES BLOOD RIGHT HAND 01/25/2022 2037   SPECREQUEST  01/25/2022 2037    BOTTLES DRAWN AEROBIC AND ANAEROBIC Blood Culture results may not be optimal due to an excessive volume of blood received in culture bottles   SPECREQUEST  01/25/2022 2037    BOTTLES DRAWN AEROBIC AND ANAEROBIC Blood Culture adequate volume   CULT  01/25/2022 2037    NO GROWTH < 12 HOURS Performed at [facility], [address] [place], [place] [address]    CULT  01/25/2022 2037    NO GROWTH < 12 HOURS Performed at [facility], [address] [place], [place] [address]    REPTSTATUS PENDING 01/25/2022 2037   REPTSTATUS PENDING 01/25/2022 2037    Other culture-see note  Radiology Studies: CT ABDOMEN PELVIS WO CONTRAST  Result Date: 01/26/2022 CLINICAL DATA:  Kidney failure, acute severe sepsis EXAM: CT ABDOMEN AND PELVIS WITHOUT CONTRAST TECHNIQUE: Multidetector CT imaging of the abdomen and pelvis was performed following the standard protocol without IV contrast. RADIATION DOSE REDUCTION: This exam was performed according to the departmental dose-optimization  program which includes automated exposure control, adjustment of the mA and/or kV according to patient size and/or use of iterative reconstruction technique. COMPARISON:  None Available. FINDINGS: Lower chest: Partially visualized ascending thoracic aorta enlarged in caliber up to at least 4.4 cm. Coronary artery calcification. Bilateral lower lobe subsegmental atelectasis. Possible trace right pleural effusion. Likely tiny hiatal hernia. Hepatobiliary: The liver is enlarged measuring up to 20 cm. The hepatic parenchyma is diffusely hypodense compared to the splenic parenchyma consistent with fatty infiltration. No focal liver abnormality. No gallstones, gallbladder wall thickening, or pericholecystic fluid. No biliary dilatation. Pancreas: No focal lesion. Normal pancreatic contour. No surrounding inflammatory changes. No main pancreatic ductal dilatation. Spleen: Normal in size without focal abnormality. Adrenals/Urinary Tract: No adrenal nodule bilaterally. Nonspecific bilateral perinephric stranding. No nephrolithiasis and no hydronephrosis. No definite contour-deforming renal mass. No ureterolithiasis or hydroureter. The urinary bladder is distended with marked circumferential urinary bladder wall thickening. Stomach/Bowel: Stomach is within normal limits. No evidence of bowel wall thickening or dilatation. Colonic diverticulosis. Likely appendectomy. Vascular/Lymphatic: No abdominal aorta or iliac aneurysm. Mild atherosclerotic plaque of the aorta and its branches. No abdominal, pelvic, or inguinal lymphadenopathy. Reproductive: Prostate is enlarged measuring up to 4.7 cm. Other: There is a 1.9 x 1.9 cm well-circumscribed centrally calcified lesion within the right lower. Appears similar to a finding previously visualized within the pelvis, possibly migrated. Another similar subcentimeter finding noted within the pelvis (6:42). No intraperitoneal free fluid. No intraperitoneal free gas. No organized fluid  collection. Musculoskeletal: No abdominal wall hernia or abnormality. No suspicious lytic or blastic osseous lesions. No acute displaced fracture. Thoracic osteophyte formation. Intervertebral disc space narrowing osteophyte formation at the L5-S1 level. IMPRESSION: 1. Marked circumferential urinary bladder wall thickening. Recommend clinical correlation with urinalysis for infection. Consider direct visualization. 2. Prostatomegaly. 3. Hepatomegaly and hepatic steatosis. 4. Colonic diverticulosis with no acute diverticulitis. 5. Likely tiny hiatal hernia. 6. Aneurysmal ascending thoracic aorta-partially visualized (4.4 cm). Recommend annual imaging followup by CTA or MRA. This recommendation follows 2010 ACCF/AHA/AATS/ACR/ASA/SCA/SCAI/SIR/STS/SVM Guidelines for the Diagnosis and Management of Patients with Thoracic Aortic Disease. Circulation. 2010; 121: W546-E703. Aortic aneurysm NOS (ICD10-I71.9). 7. Aortic Atherosclerosis (ICD10-I70.0) including coronary calcification. 8. Indeterminate 1.9 x 1.9 cm well-circumscribed centrally calcified lesion within the right lower. Finding appears similar to a lesion previously visualized within the pelvis, possibly migrated. Another similar subcentimeter calcified lesion within the pelvis. Finding likely benign. No definite findings to suggest carcinoid. Electronically Signed   By: Tish Frederickson M.D.   On: 01/26/2022 00:14   CT HEAD WO CONTRAST ( )  Result Date: 01/26/2022 CLINICAL DATA:  weakness EXAM: CT HEAD WITHOUT CONTRAST TECHNIQUE: Contiguous axial images were obtained from the base of the skull through the vertex without intravenous contrast. RADIATION DOSE REDUCTION: This exam was performed according to the departmental dose-optimization program which includes automated exposure control, adjustment of the mA and/or kV according to patient size and/or use of iterative reconstruction technique. COMPARISON:  CT head 02/05/2020, MRI head 05/22/2013 BRAIN: BRAIN  Cerebral ventricle sizes are concordant with the degree of cerebral volume loss. Patchy and confluent areas of decreased attenuation are noted throughout the deep and periventricular white matter  of the cerebral hemispheres bilaterally, compatible with chronic microvascular ischemic disease. Chronic lacunar infarction of the right thalamus and posterior limb of the internal capsule. No evidence of large-territorial acute infarction. No parenchymal hemorrhage. No mass lesion. No extra-axial collection. No mass effect or midline shift. No hydrocephalus. Basilar cisterns are patent. Vascular: No hyperdense vessel. Atherosclerotic calcifications are present within the cavernous internal carotid arteries. Skull: No acute fracture or focal lesion. Sinuses/Orbits: Left maxillary sinus partially visualized polypoid like mucosal thickening. Otherwise remaining visualized paranasal sinuses and mastoid air cells are clear. The orbits are unremarkable. Other: None. IMPRESSION: No acute intracranial abnormality. Electronically Signed   By: Tish FredericksonMorgane  Naveau M.D.   On: 01/26/2022 00:02   DG Chest Port 1 View  Result Date: 01/25/2022 CLINICAL DATA:  Questionable sepsis EXAM: PORTABLE CHEST 1 VIEW COMPARISON:  02/05/2020 FINDINGS: Low lung volumes. Heart and mediastinal contours are within normal limits. No focal opacities or effusions. No acute bony abnormality. IMPRESSION: No active cardiopulmonary disease.  Low volumes. Electronically Signed   By: Charlett NoseKevin  Dover M.D.   On: 01/25/2022 20:51     LOS: 1 day   Lanae Boastamesh Konner Warrior, MD [facility]  01/26/2022, 10:52 AM

## 2022-01-27 ENCOUNTER — Inpatient Hospital Stay
Admit: 2022-01-27 | Discharge: 2022-01-27 | Disposition: A | Discharge status: Home / Self Care | Payer: Medicare HMO | Attending: Internal Medicine | Admitting: Internal Medicine

## 2022-01-27 DIAGNOSIS — R531 Weakness: Secondary | ICD-10-CM | POA: Diagnosis not present

## 2022-01-27 LAB — CBC
HCT: 29.3 % — ABNORMAL LOW (ref 39.0–52.0)
Hemoglobin: 10.4 g/dL — ABNORMAL LOW (ref 13.0–17.0)
MCH: 35.5 pg — ABNORMAL HIGH (ref 26.0–34.0)
MCHC: 35.5 g/dL (ref 30.0–36.0)
MCV: 100 fL (ref 80.0–100.0)
Platelets: 62 10*3/uL — ABNORMAL LOW (ref 150–400)
RBC: 2.93 MIL/uL — ABNORMAL LOW (ref 4.22–5.81)
RDW: 13.2 % (ref 11.5–15.5)
WBC: 5.8 10*3/uL (ref 4.0–10.5)
nRBC: 0 % (ref 0.0–0.2)

## 2022-01-27 LAB — MAGNESIUM: Magnesium: 1.3 mg/dL — ABNORMAL LOW (ref 1.7–2.4)

## 2022-01-27 LAB — COMPREHENSIVE METABOLIC PANEL
ALT: 37 U/L (ref 0–44)
AST: 71 U/L — ABNORMAL HIGH (ref 15–41)
Albumin: 3 g/dL — ABNORMAL LOW (ref 3.5–5.0)
Alkaline Phosphatase: 33 U/L — ABNORMAL LOW (ref 38–126)
Anion gap: 9 (ref 5–15)
BUN: 17 mg/dL (ref 8–23)
CO2: 23 mmol/L (ref 22–32)
Calcium: 7.7 mg/dL — ABNORMAL LOW (ref 8.9–10.3)
Chloride: 108 mmol/L (ref 98–111)
Creatinine, Ser: 0.86 mg/dL (ref 0.61–1.24)
GFR, Estimated: >60 mL/min (ref >60)
Glucose, Bld: 107 mg/dL — ABNORMAL HIGH (ref 70–99)
Potassium: 3.4 mmol/L — ABNORMAL LOW (ref 3.5–5.1)
Sodium: 140 mmol/L (ref 135–145)
Total Bilirubin: 1.2 mg/dL (ref 0.3–1.2)
Total Protein: 5.8 g/dL — ABNORMAL LOW (ref 6.5–8.1)

## 2022-01-27 LAB — HEMOGLOBIN A1C
Hgb A1c MFr Bld: 5.6 % (ref 4.8–5.6)
Mean Plasma Glucose: 114 mg/dL

## 2022-01-27 LAB — ECHOCARDIOGRAM COMPLETE
AR max vel: 3.52 cm2
AV Peak grad: 2.7 mmHg
Ao pk vel: 0.83 m/s
Area-P 1/2: 3.54 cm2
Calc EF: 55.3 %
Height: 69 in
S' Lateral: 3.2 cm
Single Plane A2C EF: 50.6 %
Single Plane A4C EF: 58 %
Weight: 2673.74 oz

## 2022-01-27 LAB — LACTIC ACID, PLASMA: Lactic Acid, Venous: 1.3 mmol/L (ref 0.5–1.9)

## 2022-01-27 MED ORDER — MAGNESIUM SULFATE 4 GM/100ML IV SOLN
4.0000 g | Freq: Once | INTRAVENOUS | Status: AC
  Administered 2022-01-27: 4 g via INTRAVENOUS
  Filled 2022-01-27 (×2): qty 100

## 2022-01-27 MED ORDER — POTASSIUM CHLORIDE 10 MEQ/100ML IV SOLN
10.0000 meq | INTRAVENOUS | Status: AC
  Administered 2022-01-27 (×2): 10 meq via INTRAVENOUS
  Filled 2022-01-27 (×2): qty 100

## 2022-01-27 NOTE — Plan of Care (Signed)
Continuing with plan of care. 

## 2022-01-27 NOTE — Progress Notes (Signed)
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  Per chart patient had "UTI symptoms" for several weeks but refused to go to Dr. In the ED appeared to be in severe sepsis-with tachycardia up to 129 tachypneic 23 blood pressure 130s to 150s systolic, 81% on room air. Cxr no pneumonia, UA abnormal LE large, WBC more than 50, RBC more than 50,.  Labs with hypokalemia metabolic acidosis lactic acidosis >9, creatinine 1.3 AST 224/ALT 53, Elevated troponin 153>112.  CBC with leukopenia 2.5> 4.9 thrombocytopenia 111 > 51.  Blood culture urine culture sent given vancomycin and cefepime, aggressive IV fluid hydration and admitted to ICU.  Assessment & Plan:   Severe sepsis secondary to E. coli UTI  -patient with severe sepsis secondary to UTI.   -S/p fluid resuscitation, blood pressure currently stable.  Lactic acid: WNL.  Remained afebrile. -Culture positive for E. coli and Enterobacterials.  On Rocephin -Continue to monitor closely in ICU.  Monitor vitals.  Continue antibiotics.  Alcohol abuse  -last drink on Wednesday drinks liquor every day.  Added CIWA scale Ativan due to tremulousness, continue thiamine folate   Thrombocytopenia: On admission 111, likely due to patient's alcohol abuse liver dysfunction, noted drop further in the setting of sepsis, monitor closely avoid Lovenox/heparin products   Metabolic acidosis/lactic acidosis: -Likely in the setting of alcohol abuse.  Lactic acid now within normal limits.  Hypokalemia: Replenished.  Repeat BMP tomorrow a.m.  Hypomagnesemia: Replenished.  Repeat magnesium level tomorrow a.m.  Anemia: Hemoglobin on admission 13.6 likely hemoconcentrated at 11.1 this morning.  Monitor   Elevated liver  enzymes Hepatic steatosis: Likely due to sepsis as well as patient's alcohol abuse.  AST significantly elevated than ALT, downtrending, continue to monitor   Elevated troponin 112>113: Suspect demand ischemia in the setting of patient's sepsis.   -Echo shows ejection fraction of 60 to 65%.  No regional wall motion abnormalities.  Normal diastolic parameters.    Hypertension: Not taking any medication at home.  Monitor.  On hydralazine as needed for elevated blood pressure.  Hyperlipidemia: Not on meds at home.  Mild dementia: Mentation at baseline   Indeterminate 1.9 X1.9 centrally calcified lesion in the  pelvis: Patient will need close follow-up outpatient.  Wife is aware.   Abnormal CT chest with aneurysmal ascending thoracic aorta-partially visualized (4.4cm). Radiology recommend annual imaging followup by CTA or MRA.   Tobacco abuse: on nicotine patch.  GERD: Continue PPI   Deconditioning/debility: PT OT evaluation once able Plan of care: Wife requested palliative care consult to discuss goals of care with the patient  DVT prophylaxis: SCD Code Status: Full code Family Communication: Patient wife present at bedside.  Plan of care discussed with patient in length and he verbalized understanding and agreed with it. Disposition Plan: To be determined  Consultants:  Palliative care  Procedures:  None  Antimicrobials:  Rocephin  Status is: Inpatient    Subjective: Patient seen and examined.  Sitting comfortably on the bed.  Wife at the bedside.  Patient appears very weak and sick.  Oriented to time place and person.  Remained afebrile.  No acute events overnight.    Wife requested palliative care consult to discuss goals of care.   Objective: Vitals:   01/27/22 1200 01/27/22  1250 01/27/22 1300 01/27/22 1350  BP: (!) 141/85  (!) 154/102   Pulse: 92 93 94 89  Resp: Temp: 98.4 F (36.9 C) 98.2 F (36.8 C) 98.2 F (36.8 C)   TempSrc: Bladder Bladder  Bladder   SpO2: 95% 95% 94%   Weight:      Height:        Intake/Output Summary (Last 24 hours) at 01/27/2022 1530 Last data filed at 01/27/2022 1247 Gross per 24 hour  Intake 2332.87 ml  Output 2600 ml  Net -267.13 ml   Filed Weights   01/25/22 2024 01/26/22 0110  Weight: 70.3 kg 75.8 kg    Examination:  General exam: Appears calm and comfortable, on nasal cannula, appears weak and sick and older than his stated age.  Wife at the bedside. Respiratory system: Clear to auscultation. Respiratory effort normal. Cardiovascular system: S1 & S2 heard, RRR. No JVD, murmurs, rubs, gallops or clicks. No pedal edema. Gastrointestinal system: Abdomen is nondistended, soft and nontender. No organomegaly or masses felt. Normal bowel sounds heard. Central nervous system: Alert and oriented.  Moving all extremities equally. Extremities: Symmetric 5 x 5 power. Skin: No rashes, lesions or ulcers Psychiatry: Judgement and insight appear normal. Mood & affect appropriate.    Data Reviewed: I have personally reviewed following labs and imaging studies  CBC: Recent Labs  Lab 01/25/22 2026 01/26/22 0147 01/27/22 0514  WBC 2.5* 4.9 5.8  NEUTROABS 2.2  --   --   HGB 13.6 11.1* 10.4*  HCT 39.0 31.4* 29.3*  MCV 99.7 100.3* 100.0  PLT 111* 51* 62*   Basic Metabolic Panel: Recent Labs  Lab 01/25/22 2026 01/25/22 2249 01/26/22 0147 01/27/22 0511 01/27/22 0514  NA 144  --  141  --  140  K 2.4*  --  3.6  --  3.4*  CL 105  --  107  --  108  CO2 16*  --  19*  --  23  GLUCOSE 129*  --  119*  --  107*  BUN 15  --  15  --  17  CREATININE 1.39*  --  1.19  --  0.86  CALCIUM 7.7*  --  7.6*  --  7.7*  MG  --  0.9*  --  1.3*  --    GFR: Estimated Creatinine Clearance: 84.5 mL/min (by C-G formula based on SCr of 0.86 mg/dL). Liver Function Tests: Recent Labs  Lab 01/25/22 2026 01/26/22 0147 01/27/22 0514  AST 224* 139* 71*  ALT 53* 43 37  ALKPHOS 73 43 33*  BILITOT 2.6* 2.1* 1.2   PROT 6.9 6.0* 5.8*  ALBUMIN 3.8 3.3* 3.0*   Recent Labs  Lab 01/25/22 2026  LIPASE 45   No results for input(s): "AMMONIA" in the last 168 hours. Coagulation Profile: Recent Labs  Lab 01/25/22 2026  INR 1.3*   Cardiac Enzymes: Recent Labs  Lab 01/25/22 2249  CKTOTAL 98   BNP (last 3 results) No results for input(s): "PROBNP" in the last 8760 hours. HbA1C: Recent Labs    01/25/22 2037  HGBA1C 5.6   CBG: Recent Labs  Lab 01/26/22 0104  GLUCAP 133*   Lipid Profile: No results for input(s): "CHOL", "HDL", "LDLCALC", "TRIG", "CHOLHDL", "LDLDIRECT" in the last 72 hours. Thyroid Function Tests: No results for input(s): "TSH", "T4TOTAL", "FREET4", "T3FREE", "THYROIDAB" in the last 72 hours. Anemia Panel: No results for input(s): "VITAMINB12", "FOLATE", "FERRITIN", "TIBC", "IRON", "RETICCTPCT" in the last 72 hours. Sepsis Labs:  Recent Labs  Lab 01/25/22 2026 01/25/22 2249 01/26/22 0147 01/26/22 0830 01/27/22 0514  PROCALCITON  --   --  87.04  --   --   LATICACIDVEN >9.0* 7.0*  --  4.3* 1.3    Recent Results (from the past 240 hour(s))  Resp Panel by RT-PCR (Flu A&B, Covid) Anterior Nasal Swab     Status: None   Collection Time: 01/25/22  8:37 PM   Specimen: Anterior Nasal Swab  Result Value Ref Range Status   SARS Coronavirus 2 by RT PCR NEGATIVE NEGATIVE Final    Comment: (NOTE) SARS-CoV-2 target nucleic acids are NOT DETECTED.  The SARS-CoV-2 RNA is generally detectable in upper respiratory specimens during the acute phase of infection. The lowest concentration of SARS-CoV-2 viral copies this assay can detect is 138 copies/mL. A negative result does not preclude SARS-Cov-2 infection and should not be used as the sole basis for treatment or other patient management decisions. A negative result may occur with  improper specimen collection/handling, submission of specimen other than nasopharyngeal swab, presence of viral mutation(s) within the areas  targeted by this assay, and inadequate number of viral copies(<138 copies/mL). A negative result must be combined with clinical observations, patient history, and epidemiological information. The expected result is Negative.  Fact Sheet for Patients:  BloggerCourse.com  Fact Sheet for Healthcare Providers:  SeriousBroker.it  This test is no t yet approved or cleared by the [place] FDA and  has been authorized for detection and/or diagnosis of SARS-CoV-2 by FDA under an Emergency Use Authorization (EUA). This EUA will remain  in effect (meaning this test can be used) for the duration of the COVID-19 declaration under Section 564(b)(1) of the Act, 21 U.S.C.section 360bbb-3(b)(1), unless the authorization is terminated  or revoked sooner.       Influenza A by PCR NEGATIVE NEGATIVE Final   Influenza B by PCR NEGATIVE NEGATIVE Final    Comment: (NOTE) The Xpert Xpress SARS-CoV-2/FLU/RSV plus assay is intended as an aid in the diagnosis of influenza from Nasopharyngeal swab specimens and should not be used as a sole basis for treatment. Nasal washings and aspirates are unacceptable for Xpert Xpress SARS-CoV-2/FLU/RSV testing.  Fact Sheet for Patients: BloggerCourse.com  Fact Sheet for Healthcare Providers: SeriousBroker.it  This test is not yet approved or cleared by the [place] FDA and has been authorized for detection and/or diagnosis of SARS-CoV-2 by FDA under an Emergency Use Authorization (EUA). This EUA will remain in effect (meaning this test can be used) for the duration of the COVID-19 declaration under Section 564(b)(1) of the Act, 21 U.S.C. section 360bbb-3(b)(1), unless the authorization is terminated or revoked.  Performed at [facility], [address] [place], [place] [address]   Blood Culture (routine x 2)     Status: Abnormal (Preliminary  result)   Collection Time: 01/25/22  8:37 PM   Specimen: Right Antecubital; Blood  Result Value Ref Range Status   Specimen Description   Final    RIGHT ANTECUBITAL Performed at [facility], [address], [place], [place] [address]    Special Requests   Final    BOTTLES DRAWN AEROBIC AND ANAEROBIC Blood Culture results may not be optimal due to an excessive volume of blood received in culture bottles Performed at [facility], [address], [place], [place] [address]    Culture  Setup Time   Final    GRAM NEGATIVE RODS IN BOTH AEROBIC AND ANAEROBIC BOTTLES CRITICAL RESULT CALLED TO,  READ BACK BY AND VERIFIED WITH: JUSTIN MILLER AT 1306 01/26/22.PMF GRAM STAIN REVIEWED-AGREE WITH RESULT Performed at [facility], [address] [place], [place] [address]    Culture (A)  Final    ESCHERICHIA COLI SUSCEPTIBILITIES TO FOLLOW Performed at [facility], [address] [place], [place] [address]    Report Status PENDING  Incomplete  Blood Culture (routine x 2)     Status: None (Preliminary result)   Collection Time: 01/25/22  8:37 PM   Specimen: BLOOD RIGHT HAND  Result Value Ref Range Status   Specimen Description BLOOD RIGHT HAND  Final   Special Requests   Final    BOTTLES DRAWN AEROBIC AND ANAEROBIC Blood Culture adequate volume   Culture   Final    NO GROWTH 2 DAYS Performed at [facility], [address] [place], [place] [address]    Report Status PENDING  Incomplete  Urine Culture     Status: Abnormal (Preliminary result)   Collection Time: 01/25/22  8:37 PM   Specimen: In/Out Cath Urine  Result Value Ref Range Status   Specimen Description   Final    IN/OUT CATH URINE Performed at [facility], [address] [place], [place] [address]    Special Requests   Final    NONE Performed at [facility], [address] [place], [place] [address]    Culture (A)  Final    >=100,000 COLONIES/mL  ESCHERICHIA COLI SUSCEPTIBILITIES TO FOLLOW Performed at [facility], [address] [place], [place] [address]    Report Status PENDING  Incomplete  Blood Culture ID Panel (Reflexed)     Status: Abnormal   Collection Time: 01/25/22  8:37 PM  Result Value Ref Range Status   Enterococcus faecalis NOT DETECTED NOT DETECTED Final   Enterococcus Faecium NOT DETECTED NOT DETECTED Final   Listeria monocytogenes NOT DETECTED NOT DETECTED Final   Staphylococcus species NOT DETECTED NOT DETECTED Final   Staphylococcus aureus (BCID) NOT DETECTED NOT DETECTED Final   Staphylococcus epidermidis NOT DETECTED NOT DETECTED Final   Staphylococcus lugdunensis NOT DETECTED NOT DETECTED Final   Streptococcus species NOT DETECTED NOT DETECTED Final   Streptococcus agalactiae NOT DETECTED NOT DETECTED Final   Streptococcus pneumoniae NOT DETECTED NOT DETECTED Final   Streptococcus pyogenes NOT DETECTED NOT DETECTED Final   A.calcoaceticus-baumannii NOT DETECTED NOT DETECTED Final   Bacteroides fragilis NOT DETECTED NOT DETECTED Final   Enterobacterales DETECTED (A) NOT DETECTED Final    Comment: Enterobacterales represent a large order of gram negative bacteria, not a single organism. CRITICAL RESULT CALLED TO, READ BACK BY AND VERIFIED WITH: JUSTIN MILLER AT 1306 01/26/22.PMF    Enterobacter cloacae complex NOT DETECTED NOT DETECTED Final   Escherichia coli DETECTED (A) NOT DETECTED Final    Comment: CRITICAL RESULT CALLED TO, READ BACK BY AND VERIFIED WITH: JUSTIN MILLER AT 1306 01/26/22.PMF    Klebsiella aerogenes NOT DETECTED NOT DETECTED Final   Klebsiella oxytoca NOT DETECTED NOT DETECTED Final   Klebsiella pneumoniae NOT DETECTED NOT DETECTED Final   Proteus species NOT DETECTED NOT DETECTED Final   Salmonella species NOT DETECTED NOT DETECTED Final   Serratia marcescens NOT DETECTED NOT DETECTED Final   Haemophilus influenzae NOT DETECTED NOT DETECTED Final   Neisseria meningitidis  NOT DETECTED NOT DETECTED Final   Pseudomonas aeruginosa NOT DETECTED NOT DETECTED Final   Stenotrophomonas maltophilia NOT DETECTED NOT DETECTED Final   Candida albicans NOT DETECTED NOT DETECTED  Final   Candida auris NOT DETECTED NOT DETECTED Final   Candida glabrata NOT DETECTED NOT DETECTED Final   Candida krusei NOT DETECTED NOT DETECTED Final   Candida parapsilosis NOT DETECTED NOT DETECTED Final   Candida tropicalis NOT DETECTED NOT DETECTED Final   Cryptococcus neoformans/gattii NOT DETECTED NOT DETECTED Final   CTX-M ESBL NOT DETECTED NOT DETECTED Final   Carbapenem resistance IMP NOT DETECTED NOT DETECTED Final   Carbapenem resistance KPC NOT DETECTED NOT DETECTED Final   Carbapenem resistance NDM NOT DETECTED NOT DETECTED Final   Carbapenem resist OXA 48 LIKE NOT DETECTED NOT DETECTED Final   Carbapenem resistance VIM NOT DETECTED NOT DETECTED Final    Comment: Performed at [facility], [address] [place], [place] [address]  MRSA Next Gen by PCR, Nasal     Status: None   Collection Time: 01/26/22  1:06 AM   Specimen: Nasal Mucosa; Nasal Swab  Result Value Ref Range Status   MRSA by PCR Next Gen NOT DETECTED NOT DETECTED Final    Comment: (NOTE) The GeneXpert MRSA Assay (FDA approved for NASAL specimens only), is one component of a comprehensive MRSA colonization surveillance program. It is not intended to diagnose MRSA infection nor to guide or monitor treatment for MRSA infections. Test performance is not FDA approved in patients less than 9 years old. Performed at [facility], [address], [place], [place] [address]       Radiology Studies: ECHOCARDIOGRAM COMPLETE  Result Date: 01/27/2022    ECHOCARDIOGRAM REPORT   Patient Name:   MARSTON MCCADDEN Date of Exam: 01/27/2022 Medical Rec #:  [id number]       Height:       69.0 in Accession #:    [id number]      Weight:       167.1 lb Date of Birth:  [birth year]        BSA:          1.914 m  Patient Age:    66 years        BP:           131/88 mmHg Patient Gender: M               HR:           91 bpm. Exam Location:  [facility] Procedure: 2D Echo Indications:     Elevated Troponin  History:         Patient has no prior history of Echocardiogram examinations.  Sonographer:     Overton Mam RDCS Referring Phys:  [id number] RAMESH KC Diagnosing Phys: Arnoldo Hooker MD  Sonographer Comments: Suboptimal subcostal window. IMPRESSIONS  1. Left ventricular ejection fraction, by estimation, is 60 to 65%. The left ventricle has normal function. The left ventricle has no regional wall motion abnormalities. Left ventricular diastolic parameters were normal.  2. Right ventricular systolic function is normal. The right ventricular size is normal.  3. Left atrial size was mildly dilated.  4. The mitral valve is normal in structure. Moderate mitral valve regurgitation.  5. The aortic valve is normal in structure. Aortic valve regurgitation is trivial. FINDINGS  Left Ventricle: Left ventricular ejection fraction, by estimation, is 60 to 65%. The left ventricle has normal function. The left ventricle has no regional wall motion abnormalities. The left ventricular internal cavity size was normal in size. There is  no left ventricular hypertrophy. Left ventricular diastolic parameters were normal. Right Ventricle: The right ventricular size is normal.  No increase in right ventricular wall thickness. Right ventricular systolic function is normal. Left Atrium: Left atrial size was mildly dilated. Right Atrium: Right atrial size was normal in size. Pericardium: There is no evidence of pericardial effusion. Mitral Valve: The mitral valve is normal in structure. Moderate mitral valve regurgitation. Tricuspid Valve: The tricuspid valve is normal in structure. Tricuspid valve regurgitation is mild. Aortic Valve: The aortic valve is normal in structure. Aortic valve regurgitation is trivial. Aortic valve peak gradient measures 2.7  mmHg. Pulmonic Valve: The pulmonic valve was normal in structure. Pulmonic valve regurgitation is trivial. Aorta: The aortic root and ascending aorta are structurally normal, with no evidence of dilitation. IAS/Shunts: No atrial level shunt detected by color flow Doppler.  LEFT VENTRICLE PLAX 2D LVIDd:         4.35 cm     Diastology LVIDs:         3.20 cm     LV e' medial:    5.98 cm/s LV PW:         1.30 cm     LV E/e' medial:  12.4 LV IVS:        1.15 cm     LV e' lateral:   9.25 cm/s LVOT diam:     2.20 cm     LV E/e' lateral: 8.0 LV SV:         56 LV SV Index:   29 LVOT Area:     3.80 cm  LV Volumes (MOD) LV vol d, MOD A2C: 63.6 ml LV vol d, MOD A4C: 65.5 ml LV vol s, MOD A2C: 31.4 ml LV vol s, MOD A4C: 27.5 ml LV SV MOD A2C:     32.2 ml LV SV MOD A4C:     65.5 ml LV SV MOD BP:      36.1 ml RIGHT VENTRICLE RV Basal diam:  3.30 cm RV S prime:     14.90 cm/s TAPSE (M-mode): 2.1 cm LEFT ATRIUM             Index        RIGHT ATRIUM           Index LA diam:        3.60 cm 1.88 cm/m   RA Area:     12.80 cm LA Vol (A2C):   33.2 ml 17.34 ml/m  RA Volume:   28.20 ml  14.73 ml/m LA Vol (A4C):   32.8 ml 17.14 ml/m LA Biplane Vol: 36.1 ml 18.86 ml/m  AORTIC VALVE AV Area (Vmax): 3.52 cm AV Vmax:        82.70 cm/s AV Peak Grad:   2.7 mmHg LVOT Vmax:      76.60 cm/s LVOT Vmean:     48.300 cm/s LVOT VTI:       0.147 m  AORTA Ao Root diam: 3.50 cm Ao Asc diam:  3.80 cm MITRAL VALVE               TRICUSPID VALVE MV Area (PHT): 3.54 cm    TR Peak grad:   37.2 mmHg MV Decel Time: 214 msec    TR Vmax:        305.00 cm/s MV E velocity: 73.90 cm/s MV A velocity: 83.30 cm/s  SHUNTS MV E/A ratio:  0.89        Systemic VTI:  0.15 m  Systemic Diam: 2.20 cm Arnoldo Hooker MD Electronically signed by Arnoldo Hooker MD Signature Date/Time: 01/27/2022/11:19:58 AM    Final    CT ABDOMEN PELVIS WO CONTRAST  Result Date: 01/26/2022 CLINICAL DATA:  Kidney failure, acute severe sepsis EXAM: CT ABDOMEN AND  PELVIS WITHOUT CONTRAST TECHNIQUE: Multidetector CT imaging of the abdomen and pelvis was performed following the standard protocol without IV contrast. RADIATION DOSE REDUCTION: This exam was performed according to the departmental dose-optimization program which includes automated exposure control, adjustment of the mA and/or kV according to patient size and/or use of iterative reconstruction technique. COMPARISON:  None Available. FINDINGS: Lower chest: Partially visualized ascending thoracic aorta enlarged in caliber up to at least 4.4 cm. Coronary artery calcification. Bilateral lower lobe subsegmental atelectasis. Possible trace right pleural effusion. Likely tiny hiatal hernia. Hepatobiliary: The liver is enlarged measuring up to 20 cm. The hepatic parenchyma is diffusely hypodense compared to the splenic parenchyma consistent with fatty infiltration. No focal liver abnormality. No gallstones, gallbladder wall thickening, or pericholecystic fluid. No biliary dilatation. Pancreas: No focal lesion. Normal pancreatic contour. No surrounding inflammatory changes. No main pancreatic ductal dilatation. Spleen: Normal in size without focal abnormality. Adrenals/Urinary Tract: No adrenal nodule bilaterally. Nonspecific bilateral perinephric stranding. No nephrolithiasis and no hydronephrosis. No definite contour-deforming renal mass. No ureterolithiasis or hydroureter. The urinary bladder is distended with marked circumferential urinary bladder wall thickening. Stomach/Bowel: Stomach is within normal limits. No evidence of bowel wall thickening or dilatation. Colonic diverticulosis. Likely appendectomy. Vascular/Lymphatic: No abdominal aorta or iliac aneurysm. Mild atherosclerotic plaque of the aorta and its branches. No abdominal, pelvic, or inguinal lymphadenopathy. Reproductive: Prostate is enlarged measuring up to 4.7 cm. Other: There is a 1.9 x 1.9 cm well-circumscribed centrally calcified lesion within the  right lower. Appears similar to a finding previously visualized within the pelvis, possibly migrated. Another similar subcentimeter finding noted within the pelvis (6:42). No intraperitoneal free fluid. No intraperitoneal free gas. No organized fluid collection. Musculoskeletal: No abdominal wall hernia or abnormality. No suspicious lytic or blastic osseous lesions. No acute displaced fracture. Thoracic osteophyte formation. Intervertebral disc space narrowing osteophyte formation at the L5-S1 level. IMPRESSION: 1. Marked circumferential urinary bladder wall thickening. Recommend clinical correlation with urinalysis for infection. Consider direct visualization. 2. Prostatomegaly. 3. Hepatomegaly and hepatic steatosis. 4. Colonic diverticulosis with no acute diverticulitis. 5. Likely tiny hiatal hernia. 6. Aneurysmal ascending thoracic aorta-partially visualized (4.4 cm). Recommend annual imaging followup by CTA or MRA. This recommendation follows 2010 ACCF/AHA/AATS/ACR/ASA/SCA/SCAI/SIR/STS/SVM Guidelines for the Diagnosis and Management of Patients with Thoracic Aortic Disease. Circulation. 2010; 121: E454-U981. Aortic aneurysm NOS (ICD10-I71.9). 7. Aortic Atherosclerosis (ICD10-I70.0) including coronary calcification. 8. Indeterminate 1.9 x 1.9 cm well-circumscribed centrally calcified lesion within the right lower. Finding appears similar to a lesion previously visualized within the pelvis, possibly migrated. Another similar subcentimeter calcified lesion within the pelvis. Finding likely benign. No definite findings to suggest carcinoid. Electronically Signed   By: Tish Frederickson M.D.   On: 01/26/2022 00:14   CT HEAD WO CONTRAST ( )  Result Date: 01/26/2022 CLINICAL DATA:  weakness EXAM: CT HEAD WITHOUT CONTRAST TECHNIQUE: Contiguous axial images were obtained from the base of the skull through the vertex without intravenous contrast. RADIATION DOSE REDUCTION: This exam was performed according to the  departmental dose-optimization program which includes automated exposure control, adjustment of the mA and/or kV according to patient size and/or use of iterative reconstruction technique. COMPARISON:  CT head 02/05/2020, MRI head 05/22/2013 BRAIN: BRAIN Cerebral ventricle sizes are concordant with  the degree of cerebral volume loss. Patchy and confluent areas of decreased attenuation are noted throughout the deep and periventricular white matter of the cerebral hemispheres bilaterally, compatible with chronic microvascular ischemic disease. Chronic lacunar infarction of the right thalamus and posterior limb of the internal capsule. No evidence of large-territorial acute infarction. No parenchymal hemorrhage. No mass lesion. No extra-axial collection. No mass effect or midline shift. No hydrocephalus. Basilar cisterns are patent. Vascular: No hyperdense vessel. Atherosclerotic calcifications are present within the cavernous internal carotid arteries. Skull: No acute fracture or focal lesion. Sinuses/Orbits: Left maxillary sinus partially visualized polypoid like mucosal thickening. Otherwise remaining visualized paranasal sinuses and mastoid air cells are clear. The orbits are unremarkable. Other: None. IMPRESSION: No acute intracranial abnormality. Electronically Signed   By: Tish Frederickson M.D.   On: 01/26/2022 00:02   DG Chest Port 1 View  Result Date: 01/25/2022 CLINICAL DATA:  Questionable sepsis EXAM: PORTABLE CHEST 1 VIEW COMPARISON:  02/05/2020 FINDINGS: Low lung volumes. Heart and mediastinal contours are within normal limits. No focal opacities or effusions. No acute bony abnormality. IMPRESSION: No active cardiopulmonary disease.  Low volumes. Electronically Signed   By: Charlett Nose M.D.   On: 01/25/2022 20:51    Scheduled Meds:  Chlorhexidine Gluconate Cloth  6 each Topical Q0600   folic acid  1 mg Oral Daily   multivitamin with minerals  1 tablet Oral Daily   nicotine  14 mg Transdermal  Daily   pantoprazole (PROTONIX) IV  40 mg Intravenous Q12H   thiamine  100 mg Oral Daily   Or   thiamine  100 mg Intravenous Daily   Continuous Infusions:  cefTRIAXone (ROCEPHIN)  IV Stopped (01/26/22 1530)   lactated ringers 100 mL/hr at 01/27/22 1247   magnesium sulfate bolus IVPB       LOS: 2 days   Time spent: 35 minutes   Chia Mowers Estill Cotta, MD [facility]  If 7PM-7AM, please contact night-coverage [email or web address] 01/27/2022, 3:30 PM

## 2022-01-27 NOTE — Progress Notes (Signed)
*  PRELIMINARY RESULTS* Echocardiogram 2D Echocardiogram has been performed.  Elijah Murphy 01/27/2022, 8:21 AM

## 2022-01-28 DIAGNOSIS — R531 Weakness: Secondary | ICD-10-CM | POA: Diagnosis not present

## 2022-01-28 LAB — URINE CULTURE: Culture: >=100000 — AB

## 2022-01-28 LAB — BASIC METABOLIC PANEL
Anion gap: 10 (ref 5–15)
BUN: 14 mg/dL (ref 8–23)
CO2: 23 mmol/L (ref 22–32)
Calcium: 7.9 mg/dL — ABNORMAL LOW (ref 8.9–10.3)
Chloride: 105 mmol/L (ref 98–111)
Creatinine, Ser: 0.76 mg/dL (ref 0.61–1.24)
GFR, Estimated: >60 mL/min (ref >60)
Glucose, Bld: 90 mg/dL (ref 70–99)
Potassium: 3.1 mmol/L — ABNORMAL LOW (ref 3.5–5.1)
Sodium: 138 mmol/L (ref 135–145)

## 2022-01-28 LAB — CBC
HCT: 32 % — ABNORMAL LOW (ref 39.0–52.0)
Hemoglobin: 11.4 g/dL — ABNORMAL LOW (ref 13.0–17.0)
MCH: 35.3 pg — ABNORMAL HIGH (ref 26.0–34.0)
MCHC: 35.6 g/dL (ref 30.0–36.0)
MCV: 99.1 fL (ref 80.0–100.0)
Platelets: 78 10*3/uL — ABNORMAL LOW (ref 150–400)
RBC: 3.23 MIL/uL — ABNORMAL LOW (ref 4.22–5.81)
RDW: 13.1 % (ref 11.5–15.5)
WBC: 6.3 10*3/uL (ref 4.0–10.5)
nRBC: 0 % (ref 0.0–0.2)

## 2022-01-28 LAB — MAGNESIUM: Magnesium: 1.9 mg/dL (ref 1.7–2.4)

## 2022-01-28 MED ORDER — POTASSIUM CHLORIDE 10 MEQ/100ML IV SOLN
10.0000 meq | INTRAVENOUS | Status: AC
  Administered 2022-01-28 (×3): 10 meq via INTRAVENOUS
  Filled 2022-01-28 (×3): qty 100

## 2022-01-28 MED ORDER — SODIUM CHLORIDE 0.9 % IV SOLN: INTRAVENOUS | Status: DC | PRN

## 2022-01-28 NOTE — Progress Notes (Signed)
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  Per chart patient had "UTI symptoms" for several weeks but refused to go to Dr. In the ED appeared to be in severe sepsis-with tachycardia up to 129 tachypneic 23 blood pressure Q000111Q to Q000111Q systolic, [id number] on room air. Cxr no pneumonia, UA abnormal LE large, WBC more than 50, RBC more than 50,.  Labs with hypokalemia metabolic acidosis lactic acidosis >9, creatinine 1.3 AST 224/ALT 53, Elevated troponin 153>112.  CBC with leukopenia 2.5> 4.9 thrombocytopenia 111 > 51.  Blood culture urine culture sent given vancomycin and cefepime, aggressive IV fluid hydration and admitted to ICU.  Assessment & Plan:   Severe sepsis secondary to E. coli UTI  -patient admitted with severe sepsis secondary to UTI.   -S/p fluid resuscitation, blood pressure currently stable.  Lactic acid: WNL.  Remained afebrile. -Blood culture positive for E. coli and Enterobacterials.  Urine culture positive for E. coli sensitive to Rocephin.  Will continue Rocephin -Will transfer patient out of ICU today  Alcohol abuse  -last drink on Wednesday drinks liquor every day.  Added CIWA scale Ativan due to tremulousness, continue thiamine folate   Thrombocytopenia: On admission 111, likely due to patient's alcohol abuse liver dysfunction, noted drop further in the setting of sepsis, monitor closely avoid Lovenox/heparin products   Metabolic acidosis/lactic acidosis: -Likely in the setting of alcohol abuse.  Lactic acid now within normal limits.  Hypokalemia: Replenished.  Repeat BMP tomorrow a.m.  Hypomagnesemia: Replenished.    Normocytic anemia: Hemoglobin on admission 13.6 likely hemoconcentrated at 11.4 this morning.  Monitor    Elevated liver enzymes Hepatic steatosis: Likely due to sepsis as well as patient's alcohol abuse.  AST significantly elevated than ALT, downtrending, continue to monitor   Elevated troponin 112>113: Suspect demand ischemia in the setting of patient's sepsis.   -Echo shows ejection fraction of 60 to 65%.  No regional wall motion abnormalities.  Normal diastolic parameters.    Hypertension: Not taking any medication at home.  Monitor.  On hydralazine as needed for elevated blood pressure..  Hyperlipidemia: Not on meds at home.  Mild dementia: Mentation at baseline   Indeterminate 1.9 X1.9 centrally calcified lesion in the  pelvis: Patient will need close follow-up outpatient.  Wife is aware.   Abnormal CT chest with aneurysmal ascending thoracic aorta-partially visualized (4.4cm). Radiology recommend annual imaging followup by CTA or MRA.   Tobacco abuse: on nicotine patch.  GERD: Continue PPI   Deconditioning/debility: PT OT evaluation once able Plan of care: Wife requested palliative care consult to discuss goals of care with the patient  DVT prophylaxis: SCD Code Status: Full code Family Communication: Patient wife present at bedside.  Plan of care discussed with patient in length and he verbalized understanding and agreed with it. Disposition Plan: To be determined  Consultants:  Palliative care  Procedures:  None  Antimicrobials:  Rocephin  Status is: Inpatient    Subjective: Patient seen and examined.  More alert this morning.  Wife reports that patient is better today, did not required any Ativan last night.  No fever.  No acute event overnight.   Objective: Vitals:   01/28/22 1000 01/28/22 1048 01/28/22 1050 01/28/22 1100  BP: (!) 173/108 (!) 152/98 (!) 151/94 Marland Kitchen)  137/94  Pulse: 100 91 93 (!) 101  Resp: 19 15 19 10   Temp: 99 F (37.2 C) 99 F (37.2 C) 99 F (37.2 C) 99 F (37.2 C)  TempSrc:      SpO2: 96% 97% 97% 98%  Weight:      Height:         Intake/Output Summary (Last 24 hours) at 01/28/2022 1118 Last data filed at 01/28/2022 1100 Gross per 24 hour  Intake 2285.76 ml  Output 2950 ml  Net -664.24 ml    Filed Weights   01/25/22 2024 01/26/22 0110  Weight: 70.3 kg 75.8 kg    Examination:  General exam: Appears calm and comfortable, on nasal cannula, appears weak and sick and older than his stated age.  Wife at the bedside.  Has tremors in his hands Respiratory system: Clear to auscultation. Respiratory effort normal. Cardiovascular system: S1 & S2 heard, RRR. No JVD, murmurs, rubs, gallops or clicks. No pedal edema. Gastrointestinal system: Abdomen is nondistended, soft and nontender. No organomegaly or masses felt. Normal bowel sounds heard. Central nervous system: Alert and oriented.  Moving all extremities equally. Extremities: Symmetric 5 x 5 power. Skin: No rashes, lesions or ulcers Psychiatry: Judgement and insight appear normal. Mood & affect appropriate.    Data Reviewed: I have personally reviewed following labs and imaging studies  CBC: Recent Labs  Lab 01/25/22 2026 01/26/22 0147 01/27/22 0514 01/28/22 0426  WBC 2.5* 4.9 5.8 6.3  NEUTROABS 2.2  --   --   --   HGB 13.6 11.1* 10.4* 11.4*  HCT 39.0 31.4* 29.3* 32.0*  MCV 99.7 100.3* 100.0 99.1  PLT 111* 51* 62* 78*    Basic Metabolic Panel: Recent Labs  Lab 01/25/22 2026 01/25/22 2249 01/26/22 0147 01/27/22 0511 01/27/22 0514 01/28/22 0426  NA 144  --  141  --  140 138  K 2.4*  --  3.6  --  3.4* 3.1*  CL 105  --  107  --  108 105  CO2 16*  --  19*  --  23 23  GLUCOSE 129*  --  119*  --  107* 90  BUN 15  --  15  --  17 14  CREATININE 1.39*  --  1.19  --  0.86 0.76  CALCIUM 7.7*  --  7.6*  --  7.7* 7.9*  MG  --  0.9*  --  1.3*  --  1.9    GFR: Estimated Creatinine Clearance: 90.8 mL/min (by C-G formula based on SCr of 0.76 mg/dL). Liver Function Tests: Recent Labs  Lab 01/25/22 2026 01/26/22 0147 01/27/22 0514  AST 224* 139*  71*  ALT 53* 43 37  ALKPHOS 73 43 33*  BILITOT 2.6* 2.1* 1.2  PROT 6.9 6.0* 5.8*  ALBUMIN 3.8 3.3* 3.0*    Recent Labs  Lab 01/25/22 2026  LIPASE 45    No results for input(s): "AMMONIA" in the last 168 hours. Coagulation Profile: Recent Labs  Lab 01/25/22 2026  INR 1.3*    Cardiac Enzymes: Recent Labs  Lab 01/25/22 2249  CKTOTAL 98    BNP (last 3 results) No results for input(s): "PROBNP" in the last 8760 hours. HbA1C: Recent Labs    01/25/22 2037  HGBA1C 5.6    CBG: Recent Labs  Lab 01/26/22 0104  GLUCAP 133*    Lipid Profile: No results for input(s): "CHOL", "HDL", "LDLCALC", "TRIG", "CHOLHDL", "LDLDIRECT" in the last 72 hours. Thyroid Function Tests: No results for input(s): "TSH", "  T4TOTAL", "FREET4", "T3FREE", "THYROIDAB" in the last 72 hours. Anemia Panel: No results for input(s): "VITAMINB12", "FOLATE", "FERRITIN", "TIBC", "IRON", "RETICCTPCT" in the last 72 hours. Sepsis Labs: Recent Labs  Lab 01/25/22 2026 01/25/22 2249 01/26/22 0147 01/26/22 0830 01/27/22 0514  PROCALCITON  --   --  87.04  --   --   LATICACIDVEN >9.0* 7.0*  --  4.3* 1.3     Recent Results (from the past 240 hour(s))  Resp Panel by RT-PCR (Flu A&B, Covid) Anterior Nasal Swab     Status: None   Collection Time: 01/25/22  8:37 PM   Specimen: Anterior Nasal Swab  Result Value Ref Range Status   SARS Coronavirus 2 by RT PCR NEGATIVE NEGATIVE Final    Comment: (NOTE) SARS-CoV-2 target nucleic acids are NOT DETECTED.  The SARS-CoV-2 RNA is generally detectable in upper respiratory specimens during the acute phase of infection. The lowest concentration of SARS-CoV-2 viral copies this assay can detect is 138 copies/mL. A negative result does not preclude SARS-Cov-2 infection and should not be used as the sole basis for treatment or other patient management decisions. A negative result may occur with  improper specimen collection/handling, submission of specimen  other than nasopharyngeal swab, presence of viral mutation(s) within the areas targeted by this assay, and inadequate number of viral copies(<138 copies/mL). A negative result must be combined with clinical observations, patient history, and epidemiological information. The expected result is Negative.  Fact Sheet for Patients:  EntrepreneurPulse.com.au  Fact Sheet for Healthcare Providers:  IncredibleEmployment.be  This test is no t yet approved or cleared by the [place] FDA and  has been authorized for detection and/or diagnosis of SARS-CoV-2 by FDA under an Emergency Use Authorization (EUA). This EUA will remain  in effect (meaning this test can be used) for the duration of the COVID-19 declaration under Section 564(b)(1) of the Act, 21 U.S.C.section 360bbb-3(b)(1), unless the authorization is terminated  or revoked sooner.       Influenza A by PCR NEGATIVE NEGATIVE Final   Influenza B by PCR NEGATIVE NEGATIVE Final    Comment: (NOTE) The Xpert Xpress SARS-CoV-2/FLU/RSV plus assay is intended as an aid in the diagnosis of influenza from Nasopharyngeal swab specimens and should not be used as a sole basis for treatment. Nasal washings and aspirates are unacceptable for Xpert Xpress SARS-CoV-2/FLU/RSV testing.  Fact Sheet for Patients: EntrepreneurPulse.com.au  Fact Sheet for Healthcare Providers: IncredibleEmployment.be  This test is not yet approved or cleared by the [place] FDA and has been authorized for detection and/or diagnosis of SARS-CoV-2 by FDA under an Emergency Use Authorization (EUA). This EUA will remain in effect (meaning this test can be used) for the duration of the COVID-19 declaration under Section 564(b)(1) of the Act, 21 U.S.C. section 360bbb-3(b)(1), unless the authorization is terminated or revoked.  Performed at [facility], [address]  [place], [place] [address]   Blood Culture (routine x 2)     Status: Abnormal (Preliminary result)   Collection Time: 01/25/22  8:37 PM   Specimen: Right Antecubital; Blood  Result Value Ref Range Status   Specimen Description   Final    RIGHT ANTECUBITAL Performed at [facility], [address] [place], [place] [address]    Special Requests   Final    BOTTLES DRAWN AEROBIC AND ANAEROBIC Blood Culture results may not be optimal due to an excessive volume of blood received in culture bottles Performed at [facility], [place],  [place], [place] [address]    Culture  Setup Time   Final    GRAM NEGATIVE RODS IN BOTH AEROBIC AND ANAEROBIC BOTTLES CRITICAL RESULT CALLED TO, READ BACK BY AND VERIFIED WITH: JUSTIN MILLER AT 1306 01/26/22.PMF GRAM STAIN REVIEWED-AGREE WITH RESULT Performed at [facility], [address] [place], [place] [address]    Culture ESCHERICHIA COLI (A)  Final   Report Status PENDING  Incomplete   Organism ID, Bacteria ESCHERICHIA COLI  Final      Susceptibility   Escherichia coli - MIC*    AMPICILLIN <=2 SENSITIVE Sensitive     CEFAZOLIN <=4 SENSITIVE Sensitive     CEFEPIME <=0.12 SENSITIVE Sensitive     CEFTAZIDIME <=1 SENSITIVE Sensitive     CEFTRIAXONE <=0.25 SENSITIVE Sensitive     CIPROFLOXACIN <=0.25 SENSITIVE Sensitive     GENTAMICIN <=1 SENSITIVE Sensitive     IMIPENEM <=0.25 SENSITIVE Sensitive     TRIMETH/SULFA <=20 SENSITIVE Sensitive     AMPICILLIN/SULBACTAM <=2 SENSITIVE Sensitive     PIP/TAZO <=4 SENSITIVE Sensitive     * ESCHERICHIA COLI  Blood Culture (routine x 2)     Status: None (Preliminary result)   Collection Time: 01/25/22  8:37 PM   Specimen: BLOOD RIGHT HAND  Result Value Ref Range Status   Specimen Description BLOOD RIGHT HAND  Final   Special Requests   Final    BOTTLES DRAWN AEROBIC AND ANAEROBIC Blood Culture adequate volume   Culture   Final    NO GROWTH 3 DAYS Performed at [facility], [address] [place], [place] [address]    Report Status PENDING  Incomplete  Urine Culture     Status: Abnormal   Collection Time: 01/25/22  8:37 PM   Specimen: In/Out Cath Urine  Result Value Ref Range Status   Specimen Description   Final    IN/OUT CATH URINE Performed at [facility], [address] [place], [place] [address]    Special Requests   Final    NONE Performed at [facility], [address] [place], [place] [address]    Culture >=100,000 COLONIES/mL ESCHERICHIA COLI (A)  Final   Report Status 01/28/2022 FINAL  Final   Organism ID, Bacteria ESCHERICHIA COLI (A)  Final      Susceptibility   Escherichia coli - MIC*    AMPICILLIN 4 SENSITIVE Sensitive     CEFAZOLIN <=4 SENSITIVE Sensitive     CEFEPIME <=0.12 SENSITIVE Sensitive     CEFTRIAXONE <=0.25 SENSITIVE Sensitive     CIPROFLOXACIN <=0.25 SENSITIVE Sensitive     GENTAMICIN <=1 SENSITIVE Sensitive     IMIPENEM <=0.25 SENSITIVE Sensitive     NITROFURANTOIN <=16 SENSITIVE Sensitive     TRIMETH/SULFA <=20 SENSITIVE Sensitive     AMPICILLIN/SULBACTAM <=2 SENSITIVE Sensitive     PIP/TAZO <=4 SENSITIVE Sensitive     * >=100,000 COLONIES/mL ESCHERICHIA COLI  Blood Culture ID Panel (Reflexed)     Status: Abnormal   Collection Time: 01/25/22  8:37 PM  Result Value Ref Range Status   Enterococcus faecalis NOT DETECTED NOT DETECTED Final   Enterococcus Faecium NOT DETECTED NOT DETECTED Final   Listeria monocytogenes NOT DETECTED NOT DETECTED Final   Staphylococcus species NOT DETECTED NOT DETECTED Final   Staphylococcus aureus (BCID) NOT DETECTED NOT DETECTED Final   Staphylococcus epidermidis NOT DETECTED NOT DETECTED Final   Staphylococcus lugdunensis NOT DETECTED NOT DETECTED Final   Streptococcus species NOT DETECTED NOT DETECTED Final  Streptococcus agalactiae NOT DETECTED NOT DETECTED Final   Streptococcus pneumoniae NOT DETECTED NOT DETECTED Final   Streptococcus  pyogenes NOT DETECTED NOT DETECTED Final   A.calcoaceticus-baumannii NOT DETECTED NOT DETECTED Final   Bacteroides fragilis NOT DETECTED NOT DETECTED Final   Enterobacterales DETECTED (A) NOT DETECTED Final    Comment: Enterobacterales represent a large order of gram negative bacteria, not a single organism. CRITICAL RESULT CALLED TO, READ BACK BY AND VERIFIED WITH: JUSTIN MILLER AT 1306 01/26/22.PMF    Enterobacter cloacae complex NOT DETECTED NOT DETECTED Final   Escherichia coli DETECTED (A) NOT DETECTED Final    Comment: CRITICAL RESULT CALLED TO, READ BACK BY AND VERIFIED WITH: JUSTIN MILLER AT [id number] 01/26/22.PMF    Klebsiella aerogenes NOT DETECTED NOT DETECTED Final   Klebsiella oxytoca NOT DETECTED NOT DETECTED Final   Klebsiella pneumoniae NOT DETECTED NOT DETECTED Final   Proteus species NOT DETECTED NOT DETECTED Final   Salmonella species NOT DETECTED NOT DETECTED Final   Serratia marcescens NOT DETECTED NOT DETECTED Final   Haemophilus influenzae NOT DETECTED NOT DETECTED Final   Neisseria meningitidis NOT DETECTED NOT DETECTED Final   Pseudomonas aeruginosa NOT DETECTED NOT DETECTED Final   Stenotrophomonas maltophilia NOT DETECTED NOT DETECTED Final   Candida albicans NOT DETECTED NOT DETECTED Final   Candida auris NOT DETECTED NOT DETECTED Final   Candida glabrata NOT DETECTED NOT DETECTED Final   Candida krusei NOT DETECTED NOT DETECTED Final   Candida parapsilosis NOT DETECTED NOT DETECTED Final   Candida tropicalis NOT DETECTED NOT DETECTED Final   Cryptococcus neoformans/gattii NOT DETECTED NOT DETECTED Final   CTX-M ESBL NOT DETECTED NOT DETECTED Final   Carbapenem resistance IMP NOT DETECTED NOT DETECTED Final   Carbapenem resistance KPC NOT DETECTED NOT DETECTED Final   Carbapenem resistance NDM NOT DETECTED NOT DETECTED Final   Carbapenem resist OXA 48 LIKE NOT DETECTED NOT DETECTED Final   Carbapenem resistance VIM NOT DETECTED NOT DETECTED Final    Comment:  Performed at [facility], [address] [place], [place] [address]  MRSA Next Gen by PCR, Nasal     Status: None   Collection Time: 01/26/22  1:06 AM   Specimen: Nasal Mucosa; Nasal Swab  Result Value Ref Range Status   MRSA by PCR Next Gen NOT DETECTED NOT DETECTED Final    Comment: (NOTE) The GeneXpert MRSA Assay (FDA approved for NASAL specimens only), is one component of a comprehensive MRSA colonization surveillance program. It is not intended to diagnose MRSA infection nor to guide or monitor treatment for MRSA infections. Test performance is not FDA approved in patients less than 71 years old. Performed at [facility], [address], [place], [place] [address]       Radiology Studies: ECHOCARDIOGRAM COMPLETE  Result Date: 01/27/2022    ECHOCARDIOGRAM REPORT   Patient Name:   Elijah Murphy Date of Exam: 01/27/2022 Medical Rec #:  [id number]       Height:       69.0 in Accession #:    [id number]      Weight:       167.1 lb Date of Birth:  1956-03-05        BSA:          1.914 m Patient Age:    23 years        BP:           131/88 mmHg Patient Gender: M  HR:           91 bpm. Exam Location:  [facility] Procedure: 2D Echo Indications:     Elevated Troponin  History:         Patient has no prior history of Echocardiogram examinations.  Sonographer:     Overton Mam RDCS Referring Phys:  [id number] RAMESH KC Diagnosing Phys: Arnoldo Hooker MD  Sonographer Comments: Suboptimal subcostal window. IMPRESSIONS  1. Left ventricular ejection fraction, by estimation, is 60 to 65%. The left ventricle has normal function. The left ventricle has no regional wall motion abnormalities. Left ventricular diastolic parameters were normal.  2. Right ventricular systolic function is normal. The right ventricular size is normal.  3. Left atrial size was mildly dilated.  4. The mitral valve is normal in structure. Moderate mitral valve regurgitation.  5. The aortic valve is normal  in structure. Aortic valve regurgitation is trivial. FINDINGS  Left Ventricle: Left ventricular ejection fraction, by estimation, is 60 to 65%. The left ventricle has normal function. The left ventricle has no regional wall motion abnormalities. The left ventricular internal cavity size was normal in size. There is  no left ventricular hypertrophy. Left ventricular diastolic parameters were normal. Right Ventricle: The right ventricular size is normal. No increase in right ventricular wall thickness. Right ventricular systolic function is normal. Left Atrium: Left atrial size was mildly dilated. Right Atrium: Right atrial size was normal in size. Pericardium: There is no evidence of pericardial effusion. Mitral Valve: The mitral valve is normal in structure. Moderate mitral valve regurgitation. Tricuspid Valve: The tricuspid valve is normal in structure. Tricuspid valve regurgitation is mild. Aortic Valve: The aortic valve is normal in structure. Aortic valve regurgitation is trivial. Aortic valve peak gradient measures 2.7 mmHg. Pulmonic Valve: The pulmonic valve was normal in structure. Pulmonic valve regurgitation is trivial. Aorta: The aortic root and ascending aorta are structurally normal, with no evidence of dilitation. IAS/Shunts: No atrial level shunt detected by color flow Doppler.  LEFT VENTRICLE PLAX 2D LVIDd:         4.35 cm     Diastology LVIDs:         3.20 cm     LV e' medial:    5.98 cm/s LV PW:         1.30 cm     LV E/e' medial:  12.4 LV IVS:        1.15 cm     LV e' lateral:   9.25 cm/s LVOT diam:     2.20 cm     LV E/e' lateral: 8.0 LV SV:         56 LV SV Index:   29 LVOT Area:     3.80 cm  LV Volumes (MOD) LV vol d, MOD A2C: 63.6 ml LV vol d, MOD A4C: 65.5 ml LV vol s, MOD A2C: 31.4 ml LV vol s, MOD A4C: 27.5 ml LV SV MOD A2C:     32.2 ml LV SV MOD A4C:     65.5 ml LV SV MOD BP:      36.1 ml RIGHT VENTRICLE RV Basal diam:  3.30 cm RV S prime:     14.90 cm/s TAPSE (M-mode): 2.1 cm LEFT ATRIUM              Index        RIGHT ATRIUM           Index LA diam:        3.60 cm 1.88  cm/m   RA Area:     12.80 cm LA Vol (A2C):   33.2 ml 17.34 ml/m  RA Volume:   28.20 ml  14.73 ml/m LA Vol (A4C):   32.8 ml 17.14 ml/m LA Biplane Vol: 36.1 ml 18.86 ml/m  AORTIC VALVE AV Area (Vmax): 3.52 cm AV Vmax:        82.70 cm/s AV Peak Grad:   2.7 mmHg LVOT Vmax:      76.60 cm/s LVOT Vmean:     48.300 cm/s LVOT VTI:       0.147 m  AORTA Ao Root diam: 3.50 cm Ao Asc diam:  3.80 cm MITRAL VALVE               TRICUSPID VALVE MV Area (PHT): 3.54 cm    TR Peak grad:   37.2 mmHg MV Decel Time: 214 msec    TR Vmax:        305.00 cm/s MV E velocity: 73.90 cm/s MV A velocity: 83.30 cm/s  SHUNTS MV E/A ratio:  0.89        Systemic VTI:  0.15 m                            Systemic Diam: 2.20 cm Serafina Royals MD Electronically signed by Serafina Royals MD Signature Date/Time: 01/27/2022/11:19:58 AM    Final     Scheduled Meds:  Chlorhexidine Gluconate Cloth  6 each Topical 99991111   folic acid  1 mg Oral Daily   multivitamin with minerals  1 tablet Oral Daily   nicotine  14 mg Transdermal Daily   pantoprazole (PROTONIX) IV  40 mg Intravenous Q12H   thiamine  100 mg Oral Daily   Or   thiamine  100 mg Intravenous Daily   Continuous Infusions:  sodium chloride Stopped (01/28/22 1029)   cefTRIAXone (ROCEPHIN)  IV Stopped (01/27/22 1701)   potassium chloride 100 mL/hr at 01/28/22 1100     LOS: 3 days   Time spent: 35 minutes   Reymundo Winship Loann Quill, MD [facility]  If 7PM-7AM, please contact night-coverage [email or web address] 01/28/2022, 11:18 AM

## 2022-01-28 NOTE — Plan of Care (Signed)
Pt improved according to wife but pt remains very weak. Receicing IV K runs x 3 for K 3.1. Eating poorly, less than 10% breakfast. Oriented x 4 but responses are slow. Requested to nap in leiu of getting OOB after breakfast will readdress after pt rests. Wife at bedside. History of ETOH, last drink last Wednesday, 4 days ago, CIWA score=4. No ativan thus far this shift.  Problem: Fluid Volume: Goal: Hemodynamic stability will improve Outcome: Progressing   Problem: Clinical Measurements: Goal: Diagnostic test results will improve Outcome: Progressing Goal: Signs and symptoms of infection will decrease Outcome: Progressing   Problem: Respiratory: Goal: Ability to maintain adequate ventilation will improve Outcome: Progressing   Problem: Education: Goal: Knowledge of General Education information will improve Description: Including pain rating scale, medication(s)/side effects and non-pharmacologic comfort measures Outcome: Progressing   Problem: Health Behavior/Discharge Planning: Goal: Ability to manage health-related needs will improve Outcome: Progressing   Problem: Clinical Measurements: Goal: Ability to maintain clinical measurements within normal limits will improve Outcome: Progressing Goal: Will remain free from infection Outcome: Progressing Goal: Diagnostic test results will improve Outcome: Progressing Goal: Respiratory complications will improve Outcome: Progressing Goal: Cardiovascular complication will be avoided Outcome: Progressing   Problem: Activity: Goal: Risk for activity intolerance will decrease Outcome: Progressing   Problem: Nutrition: Goal: Adequate nutrition will be maintained Outcome: Progressing   Problem: Coping: Goal: Level of anxiety will decrease Outcome: Progressing   Problem: Elimination: Goal: Will not experience complications related to bowel motility Outcome: Progressing Goal: Will not experience complications related to urinary  retention Outcome: Progressing   Problem: Pain Managment: Goal: General experience of comfort will improve Outcome: Progressing   Problem: Safety: Goal: Ability to remain free from injury will improve Outcome: Progressing   Problem: Skin Integrity: Goal: Risk for impaired skin integrity will decrease Outcome: Progressing

## 2022-01-28 NOTE — Plan of Care (Addendum)
Patient is in bed resting at this time. No signs of discomfort. He endorses so back soreness, but did not want to take anything for pain. Bed in low locked position. Call light and personal items in reach. Will continue to monitor.   Problem: Fluid Volume: Goal: Hemodynamic stability will improve Outcome: Progressing   Problem: Clinical Measurements: Goal: Diagnostic test results will improve Outcome: Progressing Goal: Signs and symptoms of infection will decrease Outcome: Progressing   Problem: Respiratory: Goal: Ability to maintain adequate ventilation will improve Outcome: Progressing

## 2022-01-29 ENCOUNTER — Encounter: Payer: Self-pay | Admitting: Internal Medicine

## 2022-01-29 DIAGNOSIS — R531 Weakness: Secondary | ICD-10-CM | POA: Diagnosis not present

## 2022-01-29 DIAGNOSIS — A498 Other bacterial infections of unspecified site: Secondary | ICD-10-CM | POA: Diagnosis not present

## 2022-01-29 DIAGNOSIS — N390 Urinary tract infection, site not specified: Secondary | ICD-10-CM

## 2022-01-29 DIAGNOSIS — A419 Sepsis, unspecified organism: Secondary | ICD-10-CM

## 2022-01-29 DIAGNOSIS — Z7189 Other specified counseling: Secondary | ICD-10-CM | POA: Diagnosis not present

## 2022-01-29 LAB — CULTURE, BLOOD (ROUTINE X 2)

## 2022-01-29 LAB — COMPREHENSIVE METABOLIC PANEL
ALT: 27 U/L (ref 0–44)
AST: 36 U/L (ref 15–41)
Albumin: 3.2 g/dL — ABNORMAL LOW (ref 3.5–5.0)
Alkaline Phosphatase: 45 U/L (ref 38–126)
Anion gap: 12 (ref 5–15)
BUN: 15 mg/dL (ref 8–23)
CO2: 22 mmol/L (ref 22–32)
Calcium: 8.2 mg/dL — ABNORMAL LOW (ref 8.9–10.3)
Chloride: 104 mmol/L (ref 98–111)
Creatinine, Ser: 0.8 mg/dL (ref 0.61–1.24)
GFR, Estimated: >60 mL/min (ref >60)
Glucose, Bld: 106 mg/dL — ABNORMAL HIGH (ref 70–99)
Potassium: 3.2 mmol/L — ABNORMAL LOW (ref 3.5–5.1)
Sodium: 138 mmol/L (ref 135–145)
Total Bilirubin: 1.2 mg/dL (ref 0.3–1.2)
Total Protein: 6.1 g/dL — ABNORMAL LOW (ref 6.5–8.1)

## 2022-01-29 LAB — CBC
HCT: 33 % — ABNORMAL LOW (ref 39.0–52.0)
Hemoglobin: 11.7 g/dL — ABNORMAL LOW (ref 13.0–17.0)
MCH: 34.9 pg — ABNORMAL HIGH (ref 26.0–34.0)
MCHC: 35.5 g/dL (ref 30.0–36.0)
MCV: 98.5 fL (ref 80.0–100.0)
Platelets: 95 10*3/uL — ABNORMAL LOW (ref 150–400)
RBC: 3.35 MIL/uL — ABNORMAL LOW (ref 4.22–5.81)
RDW: 13.1 % (ref 11.5–15.5)
WBC: 3.9 10*3/uL — ABNORMAL LOW (ref 4.0–10.5)
nRBC: 0 % (ref 0.0–0.2)

## 2022-01-29 LAB — MAGNESIUM: Magnesium: 1.6 mg/dL — ABNORMAL LOW (ref 1.7–2.4)

## 2022-01-29 MED ORDER — LISINOPRIL 20 MG PO TABS
20.0000 mg | ORAL_TABLET | Freq: Every day | ORAL | Status: DC
  Administered 2022-01-29 – 2022-01-30 (×2): 20 mg via ORAL
  Filled 2022-01-29 (×2): qty 1

## 2022-01-29 MED ORDER — ENOXAPARIN SODIUM 40 MG/0.4ML IJ SOSY
40.0000 mg | PREFILLED_SYRINGE | Freq: Every day | INTRAMUSCULAR | Status: DC
  Administered 2022-01-29: 40 mg via SUBCUTANEOUS
  Filled 2022-01-29: qty 0.4

## 2022-01-29 MED ORDER — CEFAZOLIN SODIUM-DEXTROSE 2-4 GM/100ML-% IV SOLN
2.0000 g | Freq: Three times a day (TID) | INTRAVENOUS | Status: DC
  Administered 2022-01-29 – 2022-01-30 (×3): 2 g via INTRAVENOUS
  Filled 2022-01-29 (×5): qty 100

## 2022-01-29 MED ORDER — SERTRALINE HCL 50 MG PO TABS
25.0000 mg | ORAL_TABLET | Freq: Every day | ORAL | Status: DC
  Administered 2022-01-29 – 2022-01-30 (×2): 25 mg via ORAL
  Filled 2022-01-29 (×2): qty 1

## 2022-01-29 MED ORDER — PANTOPRAZOLE SODIUM 40 MG PO TBEC
40.0000 mg | DELAYED_RELEASE_TABLET | Freq: Two times a day (BID) | ORAL | Status: DC
  Administered 2022-01-29 – 2022-01-30 (×3): 40 mg via ORAL
  Filled 2022-01-29 (×3): qty 1

## 2022-01-29 NOTE — Progress Notes (Signed)
Progress Note    Elijah Murphy  NID:[id number] DOB: 04-26-1955  DOA: 01/25/2022 PCP: Patient, No Pcp Per      Brief Narrative:    Medical records reviewed and are as summarized below:  Elijah Murphy is a 66 y.o. 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  Reportedly, patient had "UTI symptoms" for several weeks but refused to go to the doctors for medical evaluation.  His wife said he has not seen a physician for many years. In the ED appeared to be in severe sepsis-with tachycardia up to 129 tachypneic 23 blood pressure 130s to 150s systolic, 81% on room air. Cxr no pneumonia, UA abnormal LE large, WBC more than 50, RBC more than 50,.  Labs with hypokalemia metabolic acidosis lactic acidosis >9, creatinine 1.3 AST 224/ALT 53, Elevated troponin 153>112.  CBC with leukopenia 2.5> 4.9 thrombocytopenia 111 > 51.   He was admitted to the hospital for severe sepsis secondary to UTI.  He was treated with IV fluids and empiric IV antibiotics.  Urine and blood cultures showed E. coli consistent with E. coli UTI and bacteremia.      Assessment/Plan:   Principal Problem:   Generalized weakness Active Problems:   Essential hypertension   Sepsis secondary to UTI (HCC)   AKI (acute kidney injury) (HCC)   Lactic acidosis   Acute metabolic encephalopathy   Anemia   Transaminitis   Electrolyte abnormality   Abnormal finding on EKG   Troponin level elevated   Abnormal CT of the abdomen   Abnormal chest CT   Bacterial infection due to E. coli   Body mass index is 24.68 kg/m.  Severe sepsis secondary to E. coli UTI and E. coli bacteremia: Change IV ceftriaxone to IV Ancef.   Acute on chronic thrombocytopenia: Platelet count is stable.  Okay to start Lovenox for DVT prophylaxis.   Alcohol use  disorder: Counseled to quit   Elevated liver enzymes/hepatic steatosis: This is likely from alcohol use disorder.  Counseled to quit alcohol.   Elevated troponins: This is likely from severe sepsis   Generalized weakness: Consult PT and OT   Hypertension: BP is uncontrolled.  He used to be on lisinopril which she has not taken for about 4 years.  He is willing to restart lisinopril.  Outpatient follow-up with PCP for ongoing management.   Depression/anxiety: Patient and wife request something to help treat depression and anxiety.  He had been on Celexa in the past.  He is willing to try Zoloft.   Indeterminate 1.9 x 1.9 centrally calcified lesion in the pelvis, aneurysmal ascending thoracic aorta 4.4 cm: Outpatient follow-up with PCP recommended.  Radiologist recommended abnormal imaging with CTA or MRA to follow-up aortic aneurysm.   AKI, acute metabolic encephalopathy: Resolved   Other comorbidities include CAD, hyperlipidemia  Diet Order             DIET DYS 3 Room service appropriate? Yes with Assist; Fluid consistency: Thin  Diet effective now                            Consultants: Intensivist  Procedures: None    Medications:    Chlorhexidine Gluconate Cloth  6 each Topical Q0600   enoxaparin (LOVENOX) injection  40 mg Subcutaneous QHS  folic acid  1 mg Oral Daily   lisinopril  20 mg Oral Daily   multivitamin with minerals  1 tablet Oral Daily   nicotine  14 mg Transdermal Daily   pantoprazole  40 mg Oral BID   sertraline  25 mg Oral Daily   thiamine  100 mg Oral Daily   Or   thiamine  100 mg Intravenous Daily   Continuous Infusions:  sodium chloride 10 mL/hr at 01/29/22 0208    ceFAZolin (ANCEF) IV       Anti-infectives (From admission, onward)    Start     Dose/Rate Route Frequency Ordered Stop   01/29/22 1400  ceFAZolin (ANCEF) IVPB 2g/100 mL premix        2 g 200 mL/hr over 30 Minutes Intravenous Every 8 hours 01/29/22 0842      01/26/22 1500  cefTRIAXone (ROCEPHIN) 2 g in sodium chloride 0.9 % 100 mL IVPB  Status:  Discontinued        2 g 200 mL/hr over 30 Minutes Intravenous Every 24 hours 01/26/22 1350 01/29/22 0842   01/26/22 1400  ceFEPIme (MAXIPIME) 2 g in sodium chloride 0.9 % 100 mL IVPB  Status:  Discontinued        2 g 200 mL/hr over 30 Minutes Intravenous Every 8 hours 01/26/22 1153 01/26/22 1350   01/26/22 0900  ceFEPIme (MAXIPIME) 2 g in sodium chloride 0.9 % 100 mL IVPB  Status:  Discontinued        2 g 200 mL/hr over 30 Minutes Intravenous Every 12 hours 01/25/22 2316 01/26/22 1153   01/25/22 2030  ceFEPIme (MAXIPIME) 2 g in sodium chloride 0.9 % 100 mL IVPB        2 g 200 mL/hr over 30 Minutes Intravenous  Once 01/25/22 2026 01/25/22 2125   01/25/22 2030  metroNIDAZOLE (FLAGYL) IVPB 500 mg        500 mg 100 mL/hr over 60 Minutes Intravenous  Once 01/25/22 2026 01/25/22 2244   01/25/22 2030  vancomycin (VANCOCIN) IVPB 1000 mg/200 mL premix  Status:  Discontinued        1,000 mg 200 mL/hr over 60 Minutes Intravenous  Once 01/25/22 2026 01/25/22 2029   01/25/22 2030  vancomycin (VANCOREADY) IVPB 1500 mg/300 mL        1,500 mg 150 mL/hr over 120 Minutes Intravenous  Once 01/25/22 2029 01/26/22 0105              Family Communication/Anticipated D/C date and plan/Code Status   DVT prophylaxis: enoxaparin (LOVENOX) injection 40 mg Start: 01/29/22 2200 Place and maintain sequential compression device Start: 01/26/22 0745 SCDs Start: 01/25/22 2309     Code Status: Full Code  Family Communication: Plan discussed with his wife at the bedside Disposition Plan: Plan to discharge home tomorrow   Status is: Inpatient Remains inpatient appropriate because: IV antibiotics, generalized weakness       Subjective:   Interval events noted.  He complains of generalized weakness.  No pain.  Objective:    Vitals:   01/29/22 1100 01/29/22 1200 01/29/22 1300 01/29/22 1350  BP: (!)  150/92 114/82 (!) 136/95 (!) 158/97  Pulse: 95 94 94 96  Resp: 15 12 15 16   Temp: 98.2 F (36.8 C)   98 F (36.7 C)  TempSrc: Oral   Oral  SpO2: 95% 94% 95% 90%  Weight:      Height:       No data found.   Intake/Output Summary (Last 24  hours) at 01/29/2022 1421 Last data filed at 01/29/2022 1300 Gross per 24 hour  Intake 479.96 ml  Output 3225 ml  Net -2745.04 ml   Filed Weights   01/25/22 2024 01/26/22 0110  Weight: 70.3 kg 75.8 kg    Exam:  GEN: NAD SKIN: Warm and dry EYES: EOMI ENT: MMM CV: RRR PULM: CTA B ABD: soft, ND, NT, +BS CNS: AAO x 3, non focal EXT: No edema or tenderness        Data Reviewed:   I have personally reviewed following labs and imaging studies:  Labs: Labs show the following:   Basic Metabolic Panel: Recent Labs  Lab 01/25/22 2026 01/25/22 2249 01/26/22 0147 01/27/22 0511 01/27/22 0514 01/28/22 0426 01/29/22 0405  NA 144  --  141  --  140 138 138  K 2.4*  --  3.6  --  3.4* 3.1* 3.2*  CL 105  --  107  --  108 105 104  CO2 16*  --  19*  --  GLUCOSE 129*  --  119*  --  107* 90 106*  BUN 15  --  15  --  CREATININE 1.39*  --  1.19  --  0.86 0.76 0.80  CALCIUM 7.7*  --  7.6*  --  7.7* 7.9* 8.2*  MG  --  0.9*  --  1.3*  --  1.9 1.6*   GFR Estimated Creatinine Clearance: 90.8 mL/min (by C-G formula based on SCr of 0.8 mg/dL). Liver Function Tests: Recent Labs  Lab 01/25/22 2026 01/26/22 0147 01/27/22 0514 01/29/22 0405  AST 224* 139* 71* 36  ALT 53* 43 37 27  ALKPHOS 73 43 33* 45  BILITOT 2.6* 2.1* 1.2 1.2  PROT 6.9 6.0* 5.8* 6.1*  ALBUMIN 3.8 3.3* 3.0* 3.2*   Recent Labs  Lab 01/25/22 2026  LIPASE 45   No results for input(s): "AMMONIA" in the last 168 hours. Coagulation profile Recent Labs  Lab 01/25/22 2026  INR 1.3*    CBC: Recent Labs  Lab 01/25/22 2026 01/26/22 0147 01/27/22 0514 01/28/22 0426 01/29/22 0405  WBC 2.5* 4.9 5.8 6.3 3.9*  NEUTROABS 2.2  --   --   --   --    HGB 13.6 11.1* 10.4* 11.4* 11.7*  HCT 39.0 31.4* 29.3* 32.0* 33.0*  MCV 99.7 100.3* 100.0 99.1 98.5  PLT 111* 51* 62* 78* 95*   Cardiac Enzymes: Recent Labs  Lab 01/25/22 2249  CKTOTAL 98   BNP (last 3 results) No results for input(s): "PROBNP" in the last 8760 hours. CBG: Recent Labs  Lab 01/26/22 0104  GLUCAP 133*   D-Dimer: No results for input(s): "DDIMER" in the last 72 hours. Hgb A1c: No results for input(s): "HGBA1C" in the last 72 hours. Lipid Profile: No results for input(s): "CHOL", "HDL", "LDLCALC", "TRIG", "CHOLHDL", "LDLDIRECT" in the last 72 hours. Thyroid function studies: No results for input(s): "TSH", "T4TOTAL", "T3FREE", "THYROIDAB" in the last 72 hours.  Invalid input(s): "FREET3" Anemia work up: No results for input(s): "VITAMINB12", "FOLATE", "FERRITIN", "TIBC", "IRON", "RETICCTPCT" in the last 72 hours. Sepsis Labs: Recent Labs  Lab 01/25/22 2026 01/25/22 2249 01/26/22 0147 01/26/22 0830 01/27/22 0514 01/28/22 0426 01/29/22 0405  PROCALCITON  --   --  87.04  --   --   --   --   WBC 2.5*  --  4.9  --  5.8 6.3 3.9*  LATICACIDVEN >9.0* 7.0*  --  4.3* 1.3  --   --  Microbiology Recent Results (from the past 240 hour(s))  Resp Panel by RT-PCR (Flu A&B, Covid) Anterior Nasal Swab     Status: None   Collection Time: 01/25/22  8:37 PM   Specimen: Anterior Nasal Swab  Result Value Ref Range Status   SARS Coronavirus 2 by RT PCR NEGATIVE NEGATIVE Final    Comment: (NOTE) SARS-CoV-2 target nucleic acids are NOT DETECTED.  The SARS-CoV-2 RNA is generally detectable in upper respiratory specimens during the acute phase of infection. The lowest concentration of SARS-CoV-2 viral copies this assay can detect is 138 copies/mL. A negative result does not preclude SARS-Cov-2 infection and should not be used as the sole basis for treatment or other patient management decisions. A negative result may occur with  improper specimen  collection/handling, submission of specimen other than nasopharyngeal swab, presence of viral mutation(s) within the areas targeted by this assay, and inadequate number of viral copies(<138 copies/mL). A negative result must be combined with clinical observations, patient history, and epidemiological information. The expected result is Negative.  Fact Sheet for Patients:  BloggerCourse.com  Fact Sheet for Healthcare Providers:  SeriousBroker.it  This test is no t yet approved or cleared by the [place] FDA and  has been authorized for detection and/or diagnosis of SARS-CoV-2 by FDA under an Emergency Use Authorization (EUA). This EUA will remain  in effect (meaning this test can be used) for the duration of the COVID-19 declaration under Section 564(b)(1) of the Act, 21 U.S.C.section 360bbb-3(b)(1), unless the authorization is terminated  or revoked sooner.       Influenza A by PCR NEGATIVE NEGATIVE Final   Influenza B by PCR NEGATIVE NEGATIVE Final    Comment: (NOTE) The Xpert Xpress SARS-CoV-2/FLU/RSV plus assay is intended as an aid in the diagnosis of influenza from Nasopharyngeal swab specimens and should not be used as a sole basis for treatment. Nasal washings and aspirates are unacceptable for Xpert Xpress SARS-CoV-2/FLU/RSV testing.  Fact Sheet for Patients: BloggerCourse.com  Fact Sheet for Healthcare Providers: SeriousBroker.it  This test is not yet approved or cleared by the [place] FDA and has been authorized for detection and/or diagnosis of SARS-CoV-2 by FDA under an Emergency Use Authorization (EUA). This EUA will remain in effect (meaning this test can be used) for the duration of the COVID-19 declaration under Section 564(b)(1) of the Act, 21 U.S.C. section 360bbb-3(b)(1), unless the authorization is terminated or revoked.  Performed at [facility], [address] [place], [place] [address]   Blood Culture (routine x 2)     Status: Abnormal   Collection Time: 01/25/22  8:37 PM   Specimen: Right Antecubital; Blood  Result Value Ref Range Status   Specimen Description   Final    RIGHT ANTECUBITAL Performed at [facility], [address] [place], [place] [address]    Special Requests   Final    BOTTLES DRAWN AEROBIC AND ANAEROBIC Blood Culture results may not be optimal due to an excessive volume of blood received in culture bottles Performed at [facility], [address], [place], [place] [address]    Culture  Setup Time   Final    GRAM NEGATIVE RODS IN BOTH AEROBIC AND ANAEROBIC BOTTLES CRITICAL RESULT CALLED TO, READ BACK BY AND VERIFIED WITH: JUSTIN MILLER AT 1306 01/26/22.PMF GRAM STAIN REVIEWED-AGREE WITH RESULT Performed at [facility], [address] [place], [place] [address]    Culture ESCHERICHIA COLI (A)  Final   Report Status 01/29/2022  FINAL  Final   Organism ID, Bacteria ESCHERICHIA COLI  Final      Susceptibility   Escherichia coli - MIC*    AMPICILLIN <=2 SENSITIVE Sensitive     CEFAZOLIN <=4 SENSITIVE Sensitive     CEFEPIME <=0.12 SENSITIVE Sensitive     CEFTAZIDIME <=1 SENSITIVE Sensitive     CEFTRIAXONE <=0.25 SENSITIVE Sensitive     CIPROFLOXACIN <=0.25 SENSITIVE Sensitive     GENTAMICIN <=1 SENSITIVE Sensitive     IMIPENEM <=0.25 SENSITIVE Sensitive     TRIMETH/SULFA <=20 SENSITIVE Sensitive     AMPICILLIN/SULBACTAM <=2 SENSITIVE Sensitive     PIP/TAZO <=4 SENSITIVE Sensitive     * ESCHERICHIA COLI  Blood Culture (routine x 2)     Status: None (Preliminary result)   Collection Time: 01/25/22  8:37 PM   Specimen: BLOOD RIGHT HAND  Result Value Ref Range Status   Specimen Description BLOOD RIGHT HAND  Final   Special Requests   Final    BOTTLES DRAWN AEROBIC AND ANAEROBIC Blood Culture adequate volume   Culture   Final    NO GROWTH 4  DAYS Performed at [facility], [address] [place], [place] [address]    Report Status PENDING  Incomplete  Urine Culture     Status: Abnormal   Collection Time: 01/25/22  8:37 PM   Specimen: In/Out Cath Urine  Result Value Ref Range Status   Specimen Description   Final    IN/OUT CATH URINE Performed at [facility], [address] [place], [place] [address]    Special Requests   Final    NONE Performed at [facility], [address] [place], [place] [address]    Culture >=100,000 COLONIES/mL ESCHERICHIA COLI (A)  Final   Report Status 01/28/2022 FINAL  Final   Organism ID, Bacteria ESCHERICHIA COLI (A)  Final      Susceptibility   Escherichia coli - MIC*    AMPICILLIN 4 SENSITIVE Sensitive     CEFAZOLIN <=4 SENSITIVE Sensitive     CEFEPIME <=0.12 SENSITIVE Sensitive     CEFTRIAXONE <=0.25 SENSITIVE Sensitive     CIPROFLOXACIN <=0.25 SENSITIVE Sensitive     GENTAMICIN <=1 SENSITIVE Sensitive     IMIPENEM <=0.25 SENSITIVE Sensitive     NITROFURANTOIN <=16 SENSITIVE Sensitive     TRIMETH/SULFA <=20 SENSITIVE Sensitive     AMPICILLIN/SULBACTAM <=2 SENSITIVE Sensitive     PIP/TAZO <=4 SENSITIVE Sensitive     * >=100,000 COLONIES/mL ESCHERICHIA COLI  Blood Culture ID Panel (Reflexed)     Status: Abnormal   Collection Time: 01/25/22  8:37 PM  Result Value Ref Range Status   Enterococcus faecalis NOT DETECTED NOT DETECTED Final   Enterococcus Faecium NOT DETECTED NOT DETECTED Final   Listeria monocytogenes NOT DETECTED NOT DETECTED Final   Staphylococcus species NOT DETECTED NOT DETECTED Final   Staphylococcus aureus (BCID) NOT DETECTED NOT DETECTED Final   Staphylococcus epidermidis NOT DETECTED NOT DETECTED Final   Staphylococcus lugdunensis NOT DETECTED NOT DETECTED Final   Streptococcus species NOT DETECTED NOT DETECTED Final   Streptococcus agalactiae NOT DETECTED NOT DETECTED Final   Streptococcus pneumoniae NOT DETECTED NOT DETECTED  Final   Streptococcus pyogenes NOT DETECTED NOT DETECTED Final   A.calcoaceticus-baumannii NOT DETECTED NOT DETECTED Final   Bacteroides fragilis NOT DETECTED NOT DETECTED Final   Enterobacterales DETECTED (A) NOT DETECTED Final    Comment: Enterobacterales represent a large order of gram negative bacteria, not a single organism. CRITICAL RESULT  CALLED TO, READ BACK BY AND VERIFIED WITH: JUSTIN MILLER AT 1306 01/26/22.PMF    Enterobacter cloacae complex NOT DETECTED NOT DETECTED Final   Escherichia coli DETECTED (A) NOT DETECTED Final    Comment: CRITICAL RESULT CALLED TO, READ BACK BY AND VERIFIED WITH: JUSTIN MILLER AT 1306 01/26/22.PMF    Klebsiella aerogenes NOT DETECTED NOT DETECTED Final   Klebsiella oxytoca NOT DETECTED NOT DETECTED Final   Klebsiella pneumoniae NOT DETECTED NOT DETECTED Final   Proteus species NOT DETECTED NOT DETECTED Final   Salmonella species NOT DETECTED NOT DETECTED Final   Serratia marcescens NOT DETECTED NOT DETECTED Final   Haemophilus influenzae NOT DETECTED NOT DETECTED Final   Neisseria meningitidis NOT DETECTED NOT DETECTED Final   Pseudomonas aeruginosa NOT DETECTED NOT DETECTED Final   Stenotrophomonas maltophilia NOT DETECTED NOT DETECTED Final   Candida albicans NOT DETECTED NOT DETECTED Final   Candida auris NOT DETECTED NOT DETECTED Final   Candida glabrata NOT DETECTED NOT DETECTED Final   Candida krusei NOT DETECTED NOT DETECTED Final   Candida parapsilosis NOT DETECTED NOT DETECTED Final   Candida tropicalis NOT DETECTED NOT DETECTED Final   Cryptococcus neoformans/gattii NOT DETECTED NOT DETECTED Final   CTX-M ESBL NOT DETECTED NOT DETECTED Final   Carbapenem resistance IMP NOT DETECTED NOT DETECTED Final   Carbapenem resistance KPC NOT DETECTED NOT DETECTED Final   Carbapenem resistance NDM NOT DETECTED NOT DETECTED Final   Carbapenem resist OXA 48 LIKE NOT DETECTED NOT DETECTED Final   Carbapenem resistance VIM NOT DETECTED NOT  DETECTED Final    Comment: Performed at [facility] [address] [place], [place] [address]  MRSA Next Gen by PCR, Nasal     Status: None   Collection Time: 01/26/22  1:06 AM   Specimen: Nasal Mucosa; Nasal Swab  Result Value Ref Range Status   MRSA by PCR Next Gen NOT DETECTED NOT DETECTED Final    Comment: (NOTE) The GeneXpert MRSA Assay (FDA approved for NASAL specimens only), is one component of a comprehensive MRSA colonization surveillance program. It is not intended to diagnose MRSA infection nor to guide or monitor treatment for MRSA infections. Test performance is not FDA approved in patients less than 78 years old. Performed at [facility], [address] [place], [place] [address]     Procedures and diagnostic studies:  No results found.             LOS: 4 days   Renate Danh  [facility]   Pager on [email or web address]. If 7PM-7AM, please contact night-coverage at [email or web address]     01/29/2022, 2:21 PM

## 2022-01-29 NOTE — Evaluation (Signed)
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  Per chart patient had "UTI symptoms" for several weeks but refused to go to Dr.  In the ED appeared to be in severe sepsis-with tachycardia up to 129 tachypneic 23 blood pressure 130s to 150s systolic, 81% on room air. Elevated troponin 153>112.  CBC with leukopenia 2.5> 4.9 thrombocytopenia 111 > 51.  Blood culture urine culture sent given vancomycin and cefepime, aggressive IV fluid hydration and admitted to ICU. On 01/29/2022 pt transferred to floor.  Clinical Impression  Pt received in bed agreeable to PT evaluation. Wife by his side through out the session. Pt's PLOF independent Ambulator at household level, sup with 6 step with 2HR. Wife gave baths once every 2 weeks and other days birdbaths. Pt performed Stretcher to bed transfer with one person mod assist with unsteady stepping in ICU so that pt can be transferred to the floor. PT assessment reveals severe generalized weakness with deficits from chronic stroke on R side. R sided blindness. Pt needed mod to max assist for supine to sit form flat bed and Mod assist form Elevated HOB> STS and Bed->chair tranfers with FWW with min guard. Pt took 5 steps with FWW with Min guard. Remains unsteady in standing due to poor endurance and severe weakness. RPE 6/10 after minimum exertion and 4/10 after 4 mins of rest break. PT referred pt to mobility specialist to improve rehab potential.  Pt and wife verbalized desire to return home because pt has help while the wife work part time. PT will continue in acute ca and pt will benefit from HHPT, and HH aide after acute care.      Recommendations for follow up therapy are one  component of a multi-disciplinary discharge planning process, led by the attending physician.  Recommendations may be updated based on patient status, additional functional criteria and insurance authorization.  Follow Up Recommendations Home health PT (Home health Aide)      Assistance Recommended at Discharge Frequent or constant Supervision/Assistance  Patient can return home with the following  A little help with walking and/or transfers;A lot of help with bathing/dressing/bathroom;Assistance with cooking/housework;Direct supervision/assist for medications management;Direct supervision/assist for financial management;Assist for transportation;Help with stairs or ramp for entrance    Equipment Recommendations None recommended by PT  Recommendations for Other Services       Functional Status Assessment Patient has had a recent decline in their functional status and demonstrates the ability to make significant improvements in function in a reasonable and predictable amount of time.     Precautions / Restrictions Precautions Precautions: Fall Restrictions Weight Bearing Restrictions: No      Mobility  Bed Mobility Overal bed mobility: Needs Assistance Bed Mobility: Supine to Sit, Sit to Supine     Supine to sit: Mod assist, HOB elevated Sit to supine: Min guard   General bed mobility comments: Max asssit from flat bed    Transfers Overall transfer level: Needs assistance Equipment used: Rolling walker (2 wheels) Transfers: Sit to/from Stand, Bed to chair/wheelchair/BSC Sit to Stand: Min guard   Step pivot transfers: Min guard            Ambulation/Gait Ambulation/Gait assistance: Min guard Gait Distance (Feet): 5 Feet Assistive device: Rolling walker (2  wheels) Gait Pattern/deviations: Drifts right/left, Shuffle, Decreased stride length Gait velocity: Dec     General Gait Details: Tired after 5 ft recovered to 4/10 RPE after 4  mins of rest.  Stairs:  unsafe            Wheelchair Mobility    Modified Rankin (Stroke Patients Only)       Balance Overall balance assessment: Needs assistance Sitting-balance support: Feet supported, Bilateral upper extremity supported Sitting balance-Leahy Scale: Fair   Postural control: Posterior lean Standing balance support: Bilateral upper extremity supported Standing balance-Leahy Scale: Fair Standing balance comment: unsteady on feet                             Pertinent Vitals/Pain Pain Assessment Pain Assessment: No/denies pain    Home Living Family/patient expects to be discharged to:: Private residence Living Arrangements: Spouse/significant other (works part-time) Available Help at Discharge: Family;Available 24 hours/day;Available PRN/intermittently (Pt and Wife dosn't want SNF) Type of Home: House Home Access: Stairs to enter Entrance Stairs-Rails: Can reach both Entrance Stairs-Number of Steps: 6 (HAS PROVISION FOR RAMP IF NEEDED.)   Home Layout: One level Home Equipment: Agricultural consultant (2 wheels);BSC/3in1;Shower seat Additional Comments: Pt taakes bath onc ever 2 weeks    Prior Function Prior Level of Function : Needs assist  Cognitive Assist : Mobility (cognitive);ADLs (cognitive) Mobility (Cognitive): Step by step cues ADLs (Cognitive): Step by step cues Physical Assist : Mobility (physical) Mobility (physical): Stairs   Mobility Comments: Pt ambulaotry at household level independently with FWW. ADLs Comments: Pt needs one person assist with bathing, bird baths, toileting snce last few weeks. Wife prepares meals and adminsiters Meds. .     Hand Dominance   Dominant Hand: Right    Extremity/Trunk Assessment   Upper Extremity Assessment Upper Extremity Assessment: Generalized weakness    Lower Extremity Assessment Lower Extremity Assessment: Generalized weakness       Communication   Communication: Other (comment) (slow processing)   Cognition Arousal/Alertness: Lethargic (weak) Behavior During Therapy: Flat affect Overall Cognitive Status: Impaired/Different from baseline Area of Impairment: Orientation, Memory, Problem solving                 Orientation Level: Disoriented to, Situation           Problem Solving: Slow processing General Comments: Pt is  motivated but very weak.        General Comments      Exercises     Assessment/Plan    PT Assessment Patient needs continued PT services  PT Problem List Decreased strength;Decreased activity tolerance;Decreased balance;Decreased mobility;Decreased cognition;Decreased coordination       PT Treatment Interventions Gait training;Stair training;Functional mobility training;Therapeutic activities;Therapeutic exercise;Balance training;Neuromuscular re-education;Cognitive remediation;Patient/family education    PT Goals (Current goals can be found in the Care Plan section)  Acute Rehab PT Goals Patient Stated Goal: " I want to go home with help." PT Goal Formulation: With patient/family Time For Goal Achievement: 02/12/22 Potential to Achieve Goals: Good    Frequency Min 3X/week     Co-evaluation               AM-PAC PT "6 Clicks" Mobility  Outcome Measure Help needed turning from your back to your side while in a flat bed without using bedrails?: A Little Help needed moving from lying on your back to sitting on the side of a flat bed without using bedrails?: A Lot Help needed moving to  and from a bed to a chair (including a wheelchair)?: A Little Help needed standing up from a chair using your arms (e.g., wheelchair or bedside chair)?: A Little Help needed to walk in hospital room?: A Little Help needed climbing 3-5 steps with a railing? : A Lot 6 Click Score: 16    End of Session Equipment Utilized During Treatment: Gait belt Activity Tolerance: Patient limited by fatigue;Patient limited by lethargy Patient left: in chair;with  call bell/phone within reach;with chair alarm set;with family/visitor present Nurse Communication: Mobility status PT Visit Diagnosis: Unsteadiness on feet (R26.81);Muscle weakness (generalized) (M62.81);Difficulty in walking, not elsewhere classified (R26.2)    Time: 9604-5409 PT Time Calculation (min) (ACUTE ONLY): 27 min   Charges:   PT Evaluation $PT Eval Moderate Complexity: 1 Mod PT Treatments $Therapeutic Activity: 8-22 mins        Meir Elwood PT DPT 3:17 PM,01/29/22

## 2022-01-29 NOTE — Progress Notes (Signed)
PHARMACIST - PHYSICIAN COMMUNICATION  CONCERNING: IV to Oral Route Change Policy  RECOMMENDATION: This patient is receiving pantoprazole by the intravenous route.  Based on criteria approved by the Pharmacy and Therapeutics Committee, the intravenous medication(s) is/are being converted to the equivalent oral dose form(s).   DESCRIPTION: These criteria include: The patient is eating (either orally or via tube) and/or has been taking other orally administered medications for a least 24 hours The patient has no evidence of active gastrointestinal bleeding or impaired GI absorption (gastrectomy, short bowel, patient on TNA or NPO).  If you have questions about this conversion, please contact the Pharmacy Department   Tressie Ellis, [facility] 01/29/2022 8:43 AM

## 2022-01-29 NOTE — Evaluation (Signed)
Occupational Therapy Evaluation Patient Details Name: Elijah Murphy MRN: [id number] DOB: August 20, 1955 Today's Date: 01/29/2022   History of Present Illness Elijah Murphy is a 66 y.o. 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 He was admitted to the hospital for severe sepsis secondary to UTI. Urine and blood cultures showed E. coli consistent with E. coli UTI and bacteremia.   Clinical Impression   Elijah Murphy was seen for OT evaluation this date. Prior to hospital admission, Elijah Murphy was MOD I for household mobility using RW, assist for bathing. Elijah Murphy lives with spouse in home c 6 STE. Elijah Murphy presents to acute OT demonstrating impaired ADL performance and functional mobility 2/2 decreased activity tolerance and functional strength/ROM/balance deficits. Elijah Murphy currently requires MIN A don gown in standing. MIN A + HHA sit<>stand from chair. Reviewed seated HEP with good return demonstration. Elijah Murphy would benefit from skilled OT to address noted impairments and functional limitations. Upon hospital discharge, recommend STR.   Recommendations for follow up therapy are one component of a multi-disciplinary discharge planning process, led by the attending physician.  Recommendations may be updated based on patient status, additional functional criteria and insurance authorization.   Follow Up Recommendations  Skilled nursing-short term rehab (<3 hours/day)     Assistance Recommended at Discharge Frequent or constant Supervision/Assistance  Patient can return home with the following A little help with walking and/or transfers;A little help with bathing/dressing/bathroom;Help with stairs or ramp for entrance    Functional Status Assessment  Patient has had a recent decline in their functional status and demonstrates the ability to make  significant improvements in function in a reasonable and predictable amount of time.  Equipment Recommendations  BSC/3in1    Recommendations for Other Services       Precautions / Restrictions Precautions Precautions: Fall Restrictions Weight Bearing Restrictions: No      Mobility Bed Mobility               General bed mobility comments: received and left sitting    Transfers Overall transfer level: Needs assistance Equipment used: 1 person hand held assist Transfers: Sit to/from Stand, Bed to chair/wheelchair/BSC Sit to Stand: Min assist                  Balance Overall balance assessment: Needs assistance Sitting-balance support: Feet supported, Bilateral upper extremity supported Sitting balance-Leahy Scale: Fair   Postural control: Posterior lean Standing balance support: Bilateral upper extremity supported Standing balance-Leahy Scale: Poor                             ADL either performed or assessed with clinical judgement   ADL Overall ADL's : Needs assistance/impaired                                       General ADL Comments: MIN A don gown in standing. MIN A + HHA for simulated BSC t/f.      Pertinent Vitals/Pain Pain Assessment Pain Assessment: No/denies pain     Hand Dominance Right   Extremity/Trunk Assessment Upper Extremity Assessment Upper Extremity Assessment: Generalized weakness   Lower Extremity Assessment Lower Extremity Assessment: Generalized weakness       Communication Communication Communication: No  difficulties (increased time processing)   Cognition Arousal/Alertness: Awake/alert (weak) Behavior During Therapy: Flat affect Overall Cognitive Status: Impaired/Different from baseline Area of Impairment: Orientation, Memory, Problem solving                 Orientation Level: Disoriented to, Situation   Memory: Decreased short-term memory       Problem Solving: Slow  processing General Comments: Elijah Murphy's spouse answers questions but when asked directly and given time Elijah Murphy can answer most questions     General Comments  reviewed seated therex HEP    Exercises     Shoulder Instructions      Home Living Family/patient expects to be discharged to:: Private residence Living Arrangements: Spouse/significant other (works part-time) Available Help at Discharge: Family;Available 24 hours/day Type of Home: House Home Access: Stairs to enter [employer] of Steps: 6 (may have ramp) Entrance Stairs-Rails: Can reach both Home Layout: One level     Bathroom Shower/Tub: Tub/shower unit;Walk-in shower   Bathroom Toilet: Handicapped height     Home Equipment: Conservation officer, nature (2 wheels);BSC/3in1;Shower seat;Toilet riser   Additional Comments: Elijah Murphy taakes bath onc ever 2 weeks      Prior Functioning/Environment Prior Level of Function : Needs assist  Cognitive Assist : Mobility (cognitive);ADLs (cognitive) Mobility (Cognitive): Step by step cues ADLs (Cognitive): Step by step cues Physical Assist : Mobility (physical) Mobility (physical): Stairs   Mobility Comments: MOD I household distances using RW ADLs Comments: assist for tub transfers and LBD        OT Problem List: Decreased strength;Decreased activity tolerance;Decreased range of motion;Impaired balance (sitting and/or standing)      OT Treatment/Interventions: Self-care/ADL training;Therapeutic exercise;Energy conservation;DME and/or AE instruction;Therapeutic activities;Patient/family education;Balance training    OT Goals(Current goals can be found in the care plan section) Acute Rehab OT Goals Patient Stated Goal: to go home OT Goal Formulation: With patient/family Time For Goal Achievement: 02/12/22 Potential to Achieve Goals: Good ADL Goals Elijah Murphy Will Perform Grooming: with modified independence;standing Elijah Murphy Will Perform Lower Body Dressing: with modified  independence;sitting/lateral leans Elijah Murphy Will Transfer to Toilet: with modified independence;ambulating;regular height toilet  OT Frequency: Min 4X/week    Co-evaluation              AM-PAC OT "6 Clicks" Daily Activity     Outcome Measure Help from another person eating meals?: A Little Help from another person taking care of personal grooming?: A Little Help from another person toileting, which includes using toliet, bedpan, or urinal?: A Little Help from another person bathing (including washing, rinsing, drying)?: A Lot Help from another person to put on and taking off regular upper body clothing?: A Little Help from another person to put on and taking off regular lower body clothing?: A Lot 6 Click Score: 16   End of Session    Activity Tolerance: Patient tolerated treatment well Patient left: in chair;with call bell/phone within reach;with chair alarm set;with family/visitor present  OT Visit Diagnosis: Other abnormalities of gait and mobility (R26.89);Muscle weakness (generalized) (M62.81)                Time: [id number] OT Time Calculation (min): 17 min Charges:  OT General Charges $OT Visit: 1 Visit OT Evaluation $OT Eval Low Complexity: 1 Low OT Treatments $Self Care/Home Management : 8-22 mins  Dessie Coma, M.S. OTR/L  01/29/22, 4:11 PM  ascom [phone]

## 2022-01-29 NOTE — TOC Progression Note (Signed)
Transition of Care [facility]) - Progression Note    Patient Details  Name: Elijah Murphy MRN: [id number] Date of Birth: Feb 06, 1956  [facility] CM/SW Contact  Margarito Liner, LCSW Phone Number: 01/29/2022, 3:46 PM  Clinical Narrative: Per [facility], they can still see him since last appointment was in the last 3 years. She said to call back once anticipated discharge date determined to schedule hospital follow up appointment.   Expected Discharge Plan: Home w Home Health Services Barriers to Discharge: Continued Medical Work up  Expected Discharge Plan and Services Expected Discharge Plan: Home w Home Health Services   Discharge Planning Services: CM Consult Post Acute Care Choice: Home Health Living arrangements for the past 2 months: Single Family Home                 DME Arranged: N/A DME Agency: NA       HH Arranged: PT, OT           Social Determinants of Health (SDOH) Interventions    Readmission Risk Interventions     No data to display

## 2022-01-29 NOTE — Progress Notes (Signed)
Speech Language Pathology Treatment: Dysphagia  Patient Details Name: Elijah Murphy MRN: [id number] DOB: 15-Aug-1955 Today's Date: 01/29/2022 Time: 1450-1530 SLP Time Calculation (min) (ACUTE ONLY): 40 min  Assessment / Plan / Recommendation Clinical Impression  Pt seen for f/u w/ toleration of diet today; education w/ Wife present in room. Pt resting in bed, alert to name and that he was in hospital. Decreased awareness re: self and environment present. Pt has Baseline Cognitive decline, Dementia per chart. He requires MOD+ cues for follow through w/ basic instructions. Wife stated she Cuts his foods well at home at Baseline. Pt is on RA now. Afebrile, WBC wnl.   Pt stated pt has done "very well" over the past 2 days w/ more monitoring at meals and prep of his foods as was talked about at the BSE. Wife is pleased w/ his progress. Wife reported at eval that pt has episodes of drinking large sips and drinking "fast" and at times "puts too much in his mouth", orally holds, then sometimes "coughs" attempting to swallow it. Suspect impact from Cognitive decline on the Timing and Coordination of managing Large sips of liquids; A-P transfer and swallow/clear. Discussed the impact of Dementia on overall awareness during eating/drinking and the oral phase/A-P transfer. Discussed the importance of monitoring how much he puts in his mouth at one time -- she is now trialing a Yeti cup w/ a small opening w/ him for his water drinking. She stated he is doing well w/ it.   Pt appears to present w/ grossly adequate oropharyngeal phase swallow w/ No gross oropharyngeal phase dysphagia noted, No overt neuromuscular deficits impacting effectiveness of swallowing noted. However, in setting of Baseline Cognitive decline/Dementia, pt demonstrates mild oral tremors during oral prep stage of sips/bites and decreased Cognitive-oral awareness during oral intake -- often Impulsive w/ his eating/drinking. This can increase risk  for aspiration but when Supervision is given and general aspiration precautions utilized, pt's risk for aspiration is lessened. Slightly modifying the diet for moistened/cooked foods for ease of mastication/oral phase is helpful also.  Pt is also somewhat dependent for feeding. Wife assists him at meals here and at home per her report.    Recommend continue a more mech soft diet consistency w/ well-Cut meats, moistened foods; Thin liquids -- NO STRAWS. Pt should Help Hold Cup when drinking. Recommend general aspiration precautions including use of a Dysphagia drink cup for better flow control when drinking liquids. Feeding Support but allow pt to feed self as much as possible -- Finger foods. Reduce distractions at meals. Pills CRUSHED vs Whole in Puree for safer, easier swallowing.  Thorough Education discussed and given on Pills in Puree; food consistencies, food prep, and easy to eat options; general aspiration precautions to include reducing distractions during meals and checking for oral clearing b/t bites w/ Wife.  No further skilled ST services indicated at this time. Education completed, Handouts given. NSG updated, agreed. Recommend Dietician f/u for support. MD to reconsult if New needs arise during admit.       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  Per chart patient had "UTI symptoms" for several weeks but refused to go to MD.  In the ED appeared to be in severe sepsis-with tachycardia.   Per chart, pt has h/o GI/Esophageal issues in 10/2019 and 02/2020 c/b "circumferential wall thickening of the Esophagus which  may be  secondary to Esophagitis. Consider evaluation with upper endoscopy" per Abd scan. Pt did not f/u w/ GI then.      SLP Plan  All goals met      Recommendations for follow up therapy are one component of a multi-disciplinary  discharge planning process, led by the attending physician.  Recommendations may be updated based on patient status, additional functional criteria and insurance authorization.    Recommendations  Diet recommendations: Dysphagia 3 (mechanical soft);Thin liquid (for ease of diet and to reduce impulsive eating behaviors) Liquids provided via: Cup;No straw Medication Administration: Whole meds with puree (vs need to Crush in puree) Supervision: Patient able to self feed;Intermittent supervision to cue for compensatory strategies Compensations: Minimize environmental distractions;Slow rate;Small sips/bites;Lingual sweep for clearance of pocketing;Follow solids with liquid Postural Changes and/or Swallow Maneuvers: Out of bed for meals;Seated upright 90 degrees;Upright 30-60 min after meal                General recommendations:  (Dietician f/u) Oral Care Recommendations: Oral care BID;Oral care before and after PO;Staff/trained caregiver to provide oral care (to monitor) Follow Up Recommendations: No SLP follow up Assistance recommended at discharge: Intermittent Supervision/Assistance (d/t impulsive eating behaviors) SLP Visit Diagnosis: Dysphagia, oral phase (R13.11) (mild; Baseline Dementia impacting awareness) Plan: All goals met            Orinda Kenner, [place], [place]; [place] [phone] (ascom) Ralyn Stlaurent  01/29/2022, 4:38 PM

## 2022-01-29 NOTE — Consult Note (Signed)
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  Per chart patient had "UTI symptoms" for several weeks but refused to go to Dr. In the ED appeared to be in severe sepsis-with tachycardia up to 129 tachypneic 23 blood pressure 130s to 150s systolic, 81% on room air. Cxr no pneumonia, UA abnormal LE large, WBC more than 50, RBC more than 50,.  Labs with hypokalemia metabolic acidosis lactic acidosis >9, creatinine 1.3 AST 224/ALT 53, Elevated troponin 153>112.  CBC with leukopenia 2.5> 4.9 thrombocytopenia 111 > 51.  Blood culture urine culture sent given vancomycin and cefepime, aggressive IV fluid hydration and admitted to ICU.    Clinical Assessment and Goals of Care: Notes and labs reviewed.  In to see patient.  Patient is resting in bed with eyes closed.  Patient's wife is at bedside.  Patient's wife discusses that they are married and share 1 child together.  She states he has a child from a previous marriage.  She states that patient had a stroke that affected his right side, and as result, he no longer drives, and is unable to play golf.  She states he does not really leave their property.  She states he does not have a lot of interaction with others as due to family dynamics he does not speak with his sons very often, or see his grandchildren.  She states she has noticed that he has comprehension issues and is questioning the beginnings of dementia.  She states that he drinks alcohol daily, and feels he does this to treat  anxiety and depression.  Wife states at baseline her husband will not go to see doctors and "would have rather have left the house I a hearse than to come to the hospital".  She states that he will need to be mobile when he comes home, and she will not purchase any more alcohol for him.  She states he will need to be willing to see doctors for things like his blood pressure that needs to be controlled.  We began to discuss goals of care.  Patient awakened and said hello and that he did not want to go to [employer].  Wife states that he seems to still be confused.  She states he is currently a full code as he requested this when speaking with staff earlier in the admission, but she states he has been clear in the past he would not want CPR and did not want to come to the hospital.  She hopes he will be able to speak to these decisions and make appropriate decisions for himself in the coming day or two.     SUMMARY  OF RECOMMENDATIONS   Wife states patient has been clear in the past that he would not care such as CPR, but on admission told staff that he wanted a full CODE STATUS.  Wife discusses that he is still a bit confused.  Wife and I are hopeful that he will be able to complete goals of care conversations for himself in the next day or two.  PMT will follow      Primary Diagnoses: Present on Admission:  AKI (acute kidney injury) (HCC)  Anemia  (Resolved) E. coli infection  Essential hypertension  Lactic acidosis  Sepsis secondary to UTI (HCC)  Transaminitis  Electrolyte abnormality  Abnormal finding on EKG  Troponin level elevated  Abnormal CT of the abdomen  Abnormal chest CT   I have reviewed the medical record, interviewed the patient and family, and examined the patient. The following aspects are pertinent.  Past Medical History:  Diagnosis Date   E. coli infection    Hyperlipidemia 08/10/2016   Hypertension    Social History   Socioeconomic History   Marital  status: Married    Spouse name: Not on file   Number of children: Not on file   Years of education: Not on file   Highest education level: Not on file  Occupational History   Not on file  Tobacco Use   Smoking status: Never   Smokeless tobacco: Current    Types: Chew  Vaping Use   Vaping Use: Never used  Substance and Sexual Activity   Alcohol use: Yes    Comment: occasionally   Drug use: No   Sexual activity: Not on file  Other Topics Concern   Not on file  Social History Narrative   Not on file   Social Determinants of Health   Financial Resource Strain: Not on file  Food Insecurity: No Food Insecurity (01/26/2022)   Hunger Vital Sign    Worried About Running Out of Food in the Last Year: Never true    Ran Out of Food in the Last Year: Never true  Transportation Needs: No Transportation Needs (01/26/2022)   PRAPARE - Administrator, Civil Service (Medical): No    Lack of Transportation (Non-Medical): No  Physical Activity: Not on file  Stress: Not on file  Social Connections: Not on file   History reviewed. No pertinent family history. Scheduled Meds:  Chlorhexidine Gluconate Cloth  6 each Topical Q0600   enoxaparin (LOVENOX) injection  40 mg Subcutaneous QHS   folic acid  1 mg Oral Daily   lisinopril  20 mg Oral Daily   multivitamin with minerals  1 tablet Oral Daily   nicotine  14 mg Transdermal Daily   pantoprazole  40 mg Oral BID   sertraline  25 mg Oral Daily   thiamine  100 mg Oral Daily   Or   thiamine  100 mg Intravenous Daily   Continuous Infusions:  sodium chloride 10 mL/hr at 01/29/22 0208    ceFAZolin (ANCEF) IV     PRN Meds:.sodium chloride, acetaminophen **OR** acetaminophen, hydrALAZINE, mouth rinse, traMADol Medications Prior to Admission:  Prior to Admission medications   Medication Sig Start Date End Date Taking? Authorizing Provider  lisinopril (ZESTRIL) 10 MG tablet Take 10 mg by mouth daily.  Patient not taking: Reported  on 01/26/2022 06/08/19   [provider]  potassium chloride 20 MEQ TBCR Take 20 mEq by mouth 2 (two) times daily for 7 days. 02/08/20 02/15/20  Enedina Finner, MD  Allergies  Allergen Reactions   Sulfa Antibiotics Hives   Review of Systems  All other systems reviewed and are negative.   Physical Exam Constitutional:      Comments: He awakens during meeting but does not really speak and goes back to sleep..     Vital Signs: BP 114/82 (BP Location: Left Arm)   Pulse 94   Temp 98.2 F (36.8 C)   Resp 12   Ht 5\' 9"  (1.753 m)   Wt 75.8 kg   SpO2 94%   BMI 24.68 kg/m  Pain Scale: 0-10   Pain Score: 0-No pain   SpO2: SpO2: 94 % O2 Device:SpO2: 94 % O2 Flow Rate: .O2 Flow Rate (L/min): 2 L/min  IO: Intake/output summary:  Intake/Output Summary (Last 24 hours) at 01/29/2022 1308 Last data filed at 01/29/2022 0700 Gross per 24 hour  Intake 466.8 ml  Output 2925 ml  Net -2458.2 ml    LBM: Last BM Date : 01/25/22 Baseline Weight: Weight: 70.3 kg Most recent weight: Weight: 75.8 kg      Signed by: 01/27/22, NP   Please contact Palliative Medicine Team phone at [phone] for questions and concerns.  For individual provider: See [phone]

## 2022-01-29 NOTE — Progress Notes (Signed)
Foley catheter removed per MD order, pt tolerated well. 

## 2022-01-29 NOTE — Progress Notes (Signed)
Pt to transferred to 2C-217 per MD order, report given to [facility]. VSS. Belongings sent with pt. Wife at bedside.

## 2022-01-30 DIAGNOSIS — N179 Acute kidney failure, unspecified: Secondary | ICD-10-CM

## 2022-01-30 DIAGNOSIS — A419 Sepsis, unspecified organism: Secondary | ICD-10-CM | POA: Diagnosis present

## 2022-01-30 DIAGNOSIS — R972 Elevated prostate specific antigen [PSA]: Secondary | ICD-10-CM | POA: Diagnosis present

## 2022-01-30 DIAGNOSIS — R6520 Severe sepsis without septic shock: Secondary | ICD-10-CM | POA: Diagnosis not present

## 2022-01-30 DIAGNOSIS — Z7189 Other specified counseling: Secondary | ICD-10-CM | POA: Diagnosis not present

## 2022-01-30 DIAGNOSIS — G9341 Metabolic encephalopathy: Secondary | ICD-10-CM

## 2022-01-30 DIAGNOSIS — R531 Weakness: Secondary | ICD-10-CM | POA: Diagnosis not present

## 2022-01-30 LAB — PHOSPHORUS: Phosphorus: 3.2 mg/dL (ref 2.5–4.6)

## 2022-01-30 LAB — CULTURE, BLOOD (ROUTINE X 2)
Culture: NO GROWTH
Special Requests: ADEQUATE

## 2022-01-30 LAB — MAGNESIUM: Magnesium: 2 mg/dL (ref 1.7–2.4)

## 2022-01-30 LAB — POTASSIUM: Potassium: 3.7 mmol/L (ref 3.5–5.1)

## 2022-01-30 MED ORDER — AMOXICILLIN 500 MG PO CAPS
1000.0000 mg | ORAL_CAPSULE | Freq: Three times a day (TID) | ORAL | Status: DC
  Administered 2022-01-30: 1000 mg via ORAL
  Filled 2022-01-30: qty 2

## 2022-01-30 MED ORDER — POTASSIUM CHLORIDE CRYS ER 20 MEQ PO TBCR
40.0000 meq | EXTENDED_RELEASE_TABLET | ORAL | Status: DC
  Administered 2022-01-30: 40 meq via ORAL
  Filled 2022-01-30: qty 2

## 2022-01-30 MED ORDER — POTASSIUM CHLORIDE CRYS ER 10 MEQ PO TBCR: 10.0000 meq | EXTENDED_RELEASE_TABLET | Freq: Every day | ORAL | 0 refills | Status: DC

## 2022-01-30 MED ORDER — POTASSIUM CHLORIDE 20 MEQ PO PACK
40.0000 meq | PACK | ORAL | Status: DC
  Administered 2022-01-30: 40 meq via ORAL
  Filled 2022-01-30: qty 2

## 2022-01-30 MED ORDER — MAGNESIUM SULFATE 2 GM/50ML IV SOLN
2.0000 g | Freq: Once | INTRAVENOUS | Status: AC
  Administered 2022-01-30: 2 g via INTRAVENOUS
  Filled 2022-01-30: qty 50

## 2022-01-30 MED ORDER — AMOXICILLIN 500 MG PO CAPS: 1000.0000 mg | ORAL_CAPSULE | Freq: Three times a day (TID) | ORAL | 0 refills | Status: AC

## 2022-01-30 MED ORDER — SERTRALINE HCL 25 MG PO TABS: 25.0000 mg | ORAL_TABLET | Freq: Every day | ORAL | 0 refills | Status: DC

## 2022-01-30 MED ORDER — LISINOPRIL 20 MG PO TABS: 20.0000 mg | ORAL_TABLET | Freq: Every day | ORAL | 0 refills | Status: DC

## 2022-01-30 NOTE — Care Management Important Message (Signed)
Important Message  Patient Details  Name: OSHEA PERCIVAL MRN: [id number] Date of Birth: 1955-07-19   Medicare Important Message Given:  Yes     Johnell Comings 01/30/2022, 12:23 PM

## 2022-01-30 NOTE — Progress Notes (Signed)
Physical Therapy Treatment Patient Details Name: Elijah Murphy MRN: [id number] DOB: 08-Feb-1956 Today's Date: 01/30/2022   History of Present Illness Elijah Murphy is a 66 y.o. 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 He was admitted to the hospital for severe sepsis secondary to UTI. Urine and blood cultures showed E. coli consistent with E. coli UTI and bacteremia.    PT Comments    Pt resting in bed upon PT arrival; agreeable to PT session; pt's wife present.  During session pt min assist semi-supine to sitting edge of bed; CGA with transfers; and CGA to ambulate 100 feet with RW use.  Pt shaky in general and noted with altered gait pattern (pt with h/o stroke and pt's wife reports gait pattern very close to baseline for pt).  Discussed with pt and pt's wife recommendation for 24/7 assist for functional mobility and use of manual w/c as needed for safety (pt's wife reports she is able to provide required assist and can get a manual w/c from church to use as needed; pt and pt's wife declining SNF at this time and want to try to make home discharge with HHPT work).  Will continue to focus on strengthening, balance, and progressive functional mobility during hospitalization.   Recommendations for follow up therapy are one component of a multi-disciplinary discharge planning process, led by the attending physician.  Recommendations may be updated based on patient status, additional functional criteria and insurance authorization.  Follow Up Recommendations  Home health PT (Home health aide)     Assistance Recommended at Discharge Frequent or constant Supervision/Assistance  Patient can return home with the following A little help with walking and/or transfers;A lot of help with  bathing/dressing/bathroom;Assistance with cooking/housework;Direct supervision/assist for medications management;Direct supervision/assist for financial management;Assist for transportation;Help with stairs or ramp for entrance   Equipment Recommendations  Rolling walker (2 wheels);BSC/3in1;Wheelchair (measurements PT);Wheelchair cushion (measurements PT) (pt's wife reports she is able to easily get manual w/c from church)    Recommendations for Other Services       Precautions / Restrictions Precautions Precautions: Fall Restrictions Weight Bearing Restrictions: No     Mobility  Bed Mobility Overal bed mobility: Needs Assistance Bed Mobility: Supine to Sit     Supine to sit: Min assist, HOB elevated     General bed mobility comments: assist for trunk; use of bed rail    Transfers Overall transfer level: Needs assistance Equipment used: Rolling walker (2 wheels) Transfers: Sit to/from Stand, Bed to chair/wheelchair/BSC Sit to Stand: Min guard   Step pivot transfers: Min guard       General transfer comment: x1 trial from bed and x1 trial from recliner (sit to stand with RW use); stand step turn bed to recliner with RW use; vc's for UE placement    Ambulation/Gait Ambulation/Gait assistance: Min guard Gait Distance (Feet): 100 Feet Assistive device: Rolling walker (2 wheels)   Gait velocity: decreased     General Gait Details: B LE foot internal rotation; generally shaky with LE's with decreased B knee flexion during swing phase; decreased B heel strike on initial contact; occasional increased L sway (CGA provided but pt able to self correct)   Stairs             Wheelchair Mobility    Modified Rankin (Stroke Patients Only)  Balance Overall balance assessment: Needs assistance Sitting-balance support: No upper extremity supported, Feet supported Sitting balance-Leahy Scale: Fair Sitting balance - Comments: steady static sitting   Standing  balance support: Bilateral upper extremity supported, During functional activity, Reliant on assistive device for balance Standing balance-Leahy Scale: Fair Standing balance comment: pt shaky in general with standing activities (CGA provided for safety but no loss of balance noted)                            Cognition Arousal/Alertness: Awake/alert Behavior During Therapy: Flat affect Overall Cognitive Status: History of cognitive impairments - at baseline                                          Exercises      General Comments  Nursing cleared pt for participation in physical therapy.  Pt agreeable to PT session.      Pertinent Vitals/Pain Pain Assessment Pain Assessment: No/denies pain Vitals (HR and O2 on room air) stable and WFL throughout treatment session.    Home Living                          Prior Function            PT Goals (current goals can now be found in the care plan section) Acute Rehab PT Goals Patient Stated Goal: " I want to go home with help." PT Goal Formulation: With patient/family Time For Goal Achievement: 02/12/22 Potential to Achieve Goals: Good Progress towards PT goals: Progressing toward goals    Frequency    Min 3X/week      PT Plan Current plan remains appropriate    Co-evaluation              AM-PAC PT "6 Clicks" Mobility   Outcome Measure  Help needed turning from your back to your side while in a flat bed without using bedrails?: A Little Help needed moving from lying on your back to sitting on the side of a flat bed without using bedrails?: A Little Help needed moving to and from a bed to a chair (including a wheelchair)?: A Little Help needed standing up from a chair using your arms (e.g., wheelchair or bedside chair)?: A Little Help needed to walk in hospital room?: A Little Help needed climbing 3-5 steps with a railing? : A Lot 6 Click Score: 17    End of Session  Equipment Utilized During Treatment: Gait belt Activity Tolerance: Patient tolerated treatment well Patient left: in chair;with call bell/phone within reach;with chair alarm set;with family/visitor present Nurse Communication: Mobility status;Precautions PT Visit Diagnosis: Unsteadiness on feet (R26.81);Muscle weakness (generalized) (M62.81);Difficulty in walking, not elsewhere classified (R26.2)     Time: 1050-1120 PT Time Calculation (min) (ACUTE ONLY): 30 min  Charges:  $Gait Training: 8-22 mins $Therapeutic Activity: 8-22 mins                     Hendricks Limes, PT 01/30/22, 4:31 PM

## 2022-01-30 NOTE — Discharge Summary (Signed)
Physician Discharge Summary   Patient: Elijah Murphy MRN: [id number] DOB: [birth year]  Admit date:     01/25/2022  Discharge date: 01/30/22  Discharge Physician: Jennye Boroughs   PCP: Wayland Denis, PA-C   Recommendations at discharge:   Follow-up with PCP as scheduled on Monday, 02/05/2022 at 2 PM Follow-up with urologist in 2 weeks  Discharge Diagnoses: Principal Problem:   Severe sepsis ([place]) Active Problems:   Sepsis secondary to UTI ([place])   AKI (acute kidney injury) ([place])   Acute metabolic encephalopathy   Generalized weakness   Essential hypertension   Lactic acidosis   Anemia   Transaminitis   Electrolyte abnormality   Abnormal finding on EKG   Troponin level elevated   Abnormal CT of the abdomen   Abnormal chest CT   Bacterial infection due to E. coli   PSA elevation  Resolved Problems:   E. coli infection  Hospital Course:  Mr. Elijah Murphy is a 66 y.o. 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  Reportedly, patient had "UTI symptoms" for several weeks but refused to go to the doctors for medical evaluation.  His wife said he has not seen a physician for many years. In the ED appeared to be in severe sepsis-with tachycardia up to 129 tachypneic 23 blood pressure Q000111Q to Q000111Q systolic, [id number] on room air. Cxr no pneumonia, UA abnormal LE large, WBC more than 50, RBC more than 50,.  Labs with hypokalemia metabolic acidosis lactic acidosis >9, creatinine 1.3 AST 224/ALT 53, Elevated troponin 153>112.  CBC with leukopenia 2.5> 4.9 thrombocytopenia 111 > 51.    He was admitted to the hospital for severe sepsis secondary to UTI.  He was treated with IV fluids and empiric IV antibiotics.  Urine and blood cultures showed E. coli consistent with E. coli UTI and bacteremia.  His condition has  improved significantly. His PSA is elevated.  Patient and wife were advised to follow-up with urologist for further evaluation.  He was evaluated by PT and OT who recommended home health therapy.  The importance of medical adherence was reiterated.  Discharge plans discussed with the patient and his wife and they agree with the plan.    Assessment and Plan:  Severe sepsis secondary to E. coli UTI and E. coli bacteremia: He will be discharged on amoxicillin to complete 10-day course of treatment.   Acute on chronic thrombocytopenia: Platelet count is stable   Enlarged prostate, elevated PSA (55.38), acute urinary retention: Foley catheter was successfully removed on 01/29/2022 and patient has been able to pass urine spontaneously.  Follow-up with urologist was strongly recommended because of significantly elevated PSA.     Alcohol use disorder: Counseled to quit     Elevated liver enzymes/hepatic steatosis: This is likely from alcohol use disorder.  Counseled to quit alcohol.     Elevated troponins: This is likely from severe sepsis     Generalized weakness: PT and OT recommended discharge to SNF but patient declined SNF.  He will be discharged with home PT and OT.     Hypertension: BP is uncontrolled.  He will be discharged on lisinopril which she was taking previously but had defaulted treatment.  Outpatient follow-up with PCP for ongoing management.     Depression/anxiety: Patient and wife request something to help treat depression and anxiety.  He had  been on Celexa in the past.  He is willing to try Zoloft.     Indeterminate 1.9 x 1.9 centrally calcified lesion in the pelvis, aneurysmal ascending thoracic aorta 4.4 cm: Outpatient follow-up with PCP recommended.  Radiologist recommended abnormal imaging with CTA or MRA to follow-up aortic aneurysm.     Hypokalemia, hypomagnesemia, AKI, acute metabolic encephalopathy: Resolved     Other comorbidities include CAD,  hyperlipidemia        Consultants: Intensivist Procedures performed: None  Disposition: Home health Diet recommendation:  Discharge Diet Orders (From admission, onward)     Start     Ordered   01/30/22 0000  Diet - low sodium heart healthy        01/30/22 1535           Cardiac diet DISCHARGE MEDICATION: Allergies as of 01/30/2022       Reactions   Sulfa Antibiotics Hives        Medication List     STOP taking these medications    Potassium Chloride ER 20 MEQ Tbcr       TAKE these medications    amoxicillin 500 MG capsule Commonly known as: AMOXIL Take 2 capsules (1,000 mg total) by mouth 3 (three) times daily for 10 doses.   lisinopril 20 MG tablet Commonly known as: ZESTRIL Take 1 tablet (20 mg total) by mouth daily. Start taking on: January 31, 2022 What changed:  medication strength how much to take   potassium chloride 10 MEQ tablet Commonly known as: KLOR-CON M Take 1 tablet (10 mEq total) by mouth daily for 7 days.   sertraline 25 MG tablet Commonly known as: ZOLOFT Take 1 tablet (25 mg total) by mouth daily. Start taking on: January 31, 2022               Durable Medical Equipment  (From admission, onward)           Start     Ordered   01/30/22 0000  For home use only DME 3 n 1        01/30/22 1048            Follow-up Information     Wayland Denis, PA-C. Go on 02/05/2022.   Specialty: Physician Assistant Why: Appointment scheduled for 2:00. Contact information: [place] [address] [facility] [place]. Schedule an appointment as soon as possible for a visit in 2 week(s).   Specialty: Urology Why: elevated PSA. Please call office for appointment. Contact information: [place], [place], [place] Follow up.   Specialty: Home Health Services Why:  They will follow up with you for your home health needs. Contact information: [address] [place] [address] [phone]                Discharge Exam: Elijah Murphy Weights   01/25/22 2024 01/26/22 0110  Weight: 70.3 kg 75.8 kg   GEN: NAD SKIN: No rash EYES: EOMI ENT: MMM CV: RRR PULM: CTA B ABD: soft, ND, NT, +BS CNS: AAO x 3, non focal EXT: No edema or tenderness RECTAL: Prostate is enlarged but nontender.  No blood on examining right index finger.  Condition at discharge: good  The results of significant diagnostics from this hospitalization (including imaging, microbiology, ancillary and laboratory) are listed below for reference.  Imaging Studies: ECHOCARDIOGRAM COMPLETE  Result Date: 01/27/2022    ECHOCARDIOGRAM REPORT   Patient Name:   Elijah Murphy Date of Exam: 01/27/2022 Medical Rec #:  [id number]       Height:       69.0 in Accession #:    [id number]      Weight:       167.1 lb Date of Birth:  1956/01/14        BSA:          1.914 m Patient Age:    66 years        BP:           131/88 mmHg Patient Gender: M               HR:           91 bpm. Exam Location:  [facility] Procedure: 2D Echo Indications:     Elevated Troponin  History:         Patient has no prior history of Echocardiogram examinations.  Sonographer:     Kathlen Brunswick RDCS Referring Phys:  [id number] [place] Diagnosing Phys: Serafina Royals MD  Sonographer Comments: Suboptimal subcostal window. IMPRESSIONS  1. Left ventricular ejection fraction, by estimation, is 60 to 65%. The left ventricle has normal function. The left ventricle has no regional wall motion abnormalities. Left ventricular diastolic parameters were normal.  2. Right ventricular systolic function is normal. The right ventricular size is normal.  3. Left atrial size was mildly dilated.  4. The mitral valve is normal in structure. Moderate mitral valve regurgitation.  5. The aortic valve is normal in structure. Aortic valve regurgitation  is trivial. FINDINGS  Left Ventricle: Left ventricular ejection fraction, by estimation, is 60 to 65%. The left ventricle has normal function. The left ventricle has no regional wall motion abnormalities. The left ventricular internal cavity size was normal in size. There is  no left ventricular hypertrophy. Left ventricular diastolic parameters were normal. Right Ventricle: The right ventricular size is normal. No increase in right ventricular wall thickness. Right ventricular systolic function is normal. Left Atrium: Left atrial size was mildly dilated. Right Atrium: Right atrial size was normal in size. Pericardium: There is no evidence of pericardial effusion. Mitral Valve: The mitral valve is normal in structure. Moderate mitral valve regurgitation. Tricuspid Valve: The tricuspid valve is normal in structure. Tricuspid valve regurgitation is mild. Aortic Valve: The aortic valve is normal in structure. Aortic valve regurgitation is trivial. Aortic valve peak gradient measures 2.7 mmHg. Pulmonic Valve: The pulmonic valve was normal in structure. Pulmonic valve regurgitation is trivial. Aorta: The aortic root and ascending aorta are structurally normal, with no evidence of dilitation. IAS/Shunts: No atrial level shunt detected by color flow Doppler.  LEFT VENTRICLE PLAX 2D LVIDd:         4.35 cm     Diastology LVIDs:         3.20 cm     LV e' medial:    5.98 cm/s LV PW:         1.30 cm     LV E/e' medial:  12.4 LV IVS:        1.15 cm     LV e' lateral:   9.25 cm/s LVOT diam:     2.20 cm     LV E/e' lateral: 8.0 LV SV:         56 LV SV Index:   29 LVOT Area:  3.80 cm  LV Volumes (MOD) LV vol d, MOD A2C: 63.6 ml LV vol d, MOD A4C: 65.5 ml LV vol s, MOD A2C: 31.4 ml LV vol s, MOD A4C: 27.5 ml LV SV MOD A2C:     32.2 ml LV SV MOD A4C:     65.5 ml LV SV MOD BP:      36.1 ml RIGHT VENTRICLE RV Basal diam:  3.30 cm RV S prime:     14.90 cm/s TAPSE (M-mode): 2.1 cm LEFT ATRIUM             Index        RIGHT ATRIUM            Index LA diam:        3.60 cm 1.88 cm/m   RA Area:     12.80 cm LA Vol (A2C):   33.2 ml 17.34 ml/m  RA Volume:   28.20 ml  14.73 ml/m LA Vol (A4C):   32.8 ml 17.14 ml/m LA Biplane Vol: 36.1 ml 18.86 ml/m  AORTIC VALVE AV Area (Vmax): 3.52 cm AV Vmax:        82.70 cm/s AV Peak Grad:   2.7 mmHg LVOT Vmax:      76.60 cm/s LVOT Vmean:     48.300 cm/s LVOT VTI:       0.147 m  AORTA Ao Root diam: 3.50 cm Ao Asc diam:  3.80 cm MITRAL VALVE               TRICUSPID VALVE MV Area (PHT): 3.54 cm    TR Peak grad:   37.2 mmHg MV Decel Time: 214 msec    TR Vmax:        305.00 cm/s MV E velocity: 73.90 cm/s MV A velocity: 83.30 cm/s  SHUNTS MV E/A ratio:  0.89        Systemic VTI:  0.15 m                            Systemic Diam: 2.20 cm Serafina Royals MD Electronically signed by Serafina Royals MD Signature Date/Time: 01/27/2022/11:19:58 AM    Final    CT ABDOMEN PELVIS WO CONTRAST  Result Date: 01/26/2022 CLINICAL DATA:  Kidney failure, acute severe sepsis EXAM: CT ABDOMEN AND PELVIS WITHOUT CONTRAST TECHNIQUE: Multidetector CT imaging of the abdomen and pelvis was performed following the standard protocol without IV contrast. RADIATION DOSE REDUCTION: This exam was performed according to the departmental dose-optimization program which includes automated exposure control, adjustment of the mA and/or kV according to patient size and/or use of iterative reconstruction technique. COMPARISON:  None Available. FINDINGS: Lower chest: Partially visualized ascending thoracic aorta enlarged in caliber up to at least 4.4 cm. Coronary artery calcification. Bilateral lower lobe subsegmental atelectasis. Possible trace right pleural effusion. Likely tiny hiatal hernia. Hepatobiliary: The liver is enlarged measuring up to 20 cm. The hepatic parenchyma is diffusely hypodense compared to the splenic parenchyma consistent with fatty infiltration. No focal liver abnormality. No gallstones, gallbladder wall thickening, or  pericholecystic fluid. No biliary dilatation. Pancreas: No focal lesion. Normal pancreatic contour. No surrounding inflammatory changes. No main pancreatic ductal dilatation. Spleen: Normal in size without focal abnormality. Adrenals/Urinary Tract: No adrenal nodule bilaterally. Nonspecific bilateral perinephric stranding. No nephrolithiasis and no hydronephrosis. No definite contour-deforming renal mass. No ureterolithiasis or hydroureter. The urinary bladder is distended with marked circumferential urinary bladder wall thickening. Stomach/Bowel: Stomach is within normal limits. No evidence  of bowel wall thickening or dilatation. Colonic diverticulosis. Likely appendectomy. Vascular/Lymphatic: No abdominal aorta or iliac aneurysm. Mild atherosclerotic plaque of the aorta and its branches. No abdominal, pelvic, or inguinal lymphadenopathy. Reproductive: Prostate is enlarged measuring up to 4.7 cm. Other: There is a 1.9 x 1.9 cm well-circumscribed centrally calcified lesion within the right lower. Appears similar to a finding previously visualized within the pelvis, possibly migrated. Another similar subcentimeter finding noted within the pelvis (6:42). No intraperitoneal free fluid. No intraperitoneal free gas. No organized fluid collection. Musculoskeletal: No abdominal wall hernia or abnormality. No suspicious lytic or blastic osseous lesions. No acute displaced fracture. Thoracic osteophyte formation. Intervertebral disc space narrowing osteophyte formation at the L5-S1 level. IMPRESSION: 1. Marked circumferential urinary bladder wall thickening. Recommend clinical correlation with urinalysis for infection. Consider direct visualization. 2. Prostatomegaly. 3. Hepatomegaly and hepatic steatosis. 4. Colonic diverticulosis with no acute diverticulitis. 5. Likely tiny hiatal hernia. 6. Aneurysmal ascending thoracic aorta-partially visualized (4.4 cm). Recommend annual imaging followup by CTA or MRA. This  recommendation follows 2010 ACCF/AHA/AATS/ACR/ASA/SCA/SCAI/SIR/STS/SVM Guidelines for the Diagnosis and Management of Patients with Thoracic Aortic Disease. Circulation. 2010; [id number]. Aortic aneurysm NOS (ICD10-I71.9). 7. Aortic Atherosclerosis (ICD10-I70.0) including coronary calcification. 8. Indeterminate 1.9 x 1.9 cm well-circumscribed centrally calcified lesion within the right lower. Finding appears similar to a lesion previously visualized within the pelvis, possibly migrated. Another similar subcentimeter calcified lesion within the pelvis. Finding likely benign. No definite findings to suggest carcinoid. Electronically Signed   By: Iven Finn M.D.   On: 01/26/2022 00:14   CT HEAD WO CONTRAST (5MM)  Result Date: 01/26/2022 CLINICAL DATA:  weakness EXAM: CT HEAD WITHOUT CONTRAST TECHNIQUE: Contiguous axial images were obtained from the base of the skull through the vertex without intravenous contrast. RADIATION DOSE REDUCTION: This exam was performed according to the departmental dose-optimization program which includes automated exposure control, adjustment of the mA and/or kV according to patient size and/or use of iterative reconstruction technique. COMPARISON:  CT head 02/05/2020, MRI head 05/22/2013 BRAIN: BRAIN Cerebral ventricle sizes are concordant with the degree of cerebral volume loss. Patchy and confluent areas of decreased attenuation are noted throughout the deep and periventricular white matter of the cerebral hemispheres bilaterally, compatible with chronic microvascular ischemic disease. Chronic lacunar infarction of the right thalamus and posterior limb of the internal capsule. No evidence of large-territorial acute infarction. No parenchymal hemorrhage. No mass lesion. No extra-axial collection. No mass effect or midline shift. No hydrocephalus. Basilar cisterns are patent. Vascular: No hyperdense vessel. Atherosclerotic calcifications are present within the cavernous  internal carotid arteries. Skull: No acute fracture or focal lesion. Sinuses/Orbits: Left maxillary sinus partially visualized polypoid like mucosal thickening. Otherwise remaining visualized paranasal sinuses and mastoid air cells are clear. The orbits are unremarkable. Other: None. IMPRESSION: No acute intracranial abnormality. Electronically Signed   By: Iven Finn M.D.   On: 01/26/2022 00:02   DG Chest Port 1 View  Result Date: 01/25/2022 CLINICAL DATA:  Questionable sepsis EXAM: PORTABLE CHEST 1 VIEW COMPARISON:  02/05/2020 FINDINGS: Low lung volumes. Heart and mediastinal contours are within normal limits. No focal opacities or effusions. No acute bony abnormality. IMPRESSION: No active cardiopulmonary disease.  Low volumes. Electronically Signed   By: Rolm Baptise M.D.   On: 01/25/2022 20:51    Microbiology: Results for orders placed or performed during the hospital encounter of 01/25/22  Resp Panel by RT-PCR (Flu A&B, Covid) Anterior Nasal Swab     Status: None   Collection Time: 01/25/22  8:37 PM   Specimen: Anterior Nasal Swab  Result Value Ref Range Status   SARS Coronavirus 2 by RT PCR NEGATIVE NEGATIVE Final    Comment: (NOTE) SARS-CoV-2 target nucleic acids are NOT DETECTED.  The SARS-CoV-2 RNA is generally detectable in upper respiratory specimens during the acute phase of infection. The lowest concentration of SARS-CoV-2 viral copies this assay can detect is 138 copies/mL. A negative result does not preclude SARS-Cov-2 infection and should not be used as the sole basis for treatment or other patient management decisions. A negative result may occur with  improper specimen collection/handling, submission of specimen other than nasopharyngeal swab, presence of viral mutation(s) within the areas targeted by this assay, and inadequate number of viral copies(<138 copies/mL). A negative result must be combined with clinical observations, patient history, and  epidemiological information. The expected result is Negative.  Fact Sheet for Patients:  EntrepreneurPulse.com.au  Fact Sheet for Healthcare Providers:  IncredibleEmployment.be  This test is no t yet approved or cleared by the [place] FDA and  has been authorized for detection and/or diagnosis of SARS-CoV-2 by FDA under an Emergency Use Authorization (EUA). This EUA will remain  in effect (meaning this test can be used) for the duration of the COVID-19 declaration under Section 564(b)(1) of the Act, 21 U.S.C.section 360bbb-3(b)(1), unless the authorization is terminated  or revoked sooner.       Influenza A by PCR NEGATIVE NEGATIVE Final   Influenza B by PCR NEGATIVE NEGATIVE Final    Comment: (NOTE) The Xpert Xpress SARS-CoV-2/FLU/RSV plus assay is intended as an aid in the diagnosis of influenza from Nasopharyngeal swab specimens and should not be used as a sole basis for treatment. Nasal washings and aspirates are unacceptable for Xpert Xpress SARS-CoV-2/FLU/RSV testing.  Fact Sheet for Patients: EntrepreneurPulse.com.au  Fact Sheet for Healthcare Providers: IncredibleEmployment.be  This test is not yet approved or cleared by the [place] FDA and has been authorized for detection and/or diagnosis of SARS-CoV-2 by FDA under an Emergency Use Authorization (EUA). This EUA will remain in effect (meaning this test can be used) for the duration of the COVID-19 declaration under Section 564(b)(1) of the Act, 21 U.S.C. section 360bbb-3(b)(1), unless the authorization is terminated or revoked.  Performed at [facility], [address], [place], [place] [address]   Blood Culture (routine x 2)     Status: Abnormal   Collection Time: 01/25/22  8:37 PM   Specimen: Right Antecubital; Blood  Result Value Ref Range Status   Specimen Description   Final    RIGHT ANTECUBITAL Performed at  [facility], [address] [place], [place] [address]    Special Requests   Final    BOTTLES DRAWN AEROBIC AND ANAEROBIC Blood Culture results may not be optimal due to an excessive volume of blood received in culture bottles Performed at [facility], [address], [place], [place] [address]    Culture  Setup Time   Final    GRAM NEGATIVE RODS IN BOTH AEROBIC AND ANAEROBIC BOTTLES CRITICAL RESULT CALLED TO, READ BACK BY AND VERIFIED WITH: JUSTIN MILLER AT [id number] 01/26/22.PMF GRAM STAIN REVIEWED-AGREE WITH RESULT Performed at [facility], [address] [place], [place] [address]    Culture ESCHERICHIA COLI (A)  Final   Report Status 01/29/2022 FINAL  Final   Organism ID, Bacteria ESCHERICHIA COLI  Final      Susceptibility   Escherichia coli - MIC*    AMPICILLIN <=2 SENSITIVE Sensitive  CEFAZOLIN <=4 SENSITIVE Sensitive     CEFEPIME <=0.12 SENSITIVE Sensitive     CEFTAZIDIME <=1 SENSITIVE Sensitive     CEFTRIAXONE <=0.25 SENSITIVE Sensitive     CIPROFLOXACIN <=0.25 SENSITIVE Sensitive     GENTAMICIN <=1 SENSITIVE Sensitive     IMIPENEM <=0.25 SENSITIVE Sensitive     TRIMETH/SULFA <=20 SENSITIVE Sensitive     AMPICILLIN/SULBACTAM <=2 SENSITIVE Sensitive     PIP/TAZO <=4 SENSITIVE Sensitive     * ESCHERICHIA COLI  Blood Culture (routine x 2)     Status: None   Collection Time: 01/25/22  8:37 PM   Specimen: BLOOD RIGHT HAND  Result Value Ref Range Status   Specimen Description BLOOD RIGHT HAND  Final   Special Requests   Final    BOTTLES DRAWN AEROBIC AND ANAEROBIC Blood Culture adequate volume   Culture   Final    NO GROWTH 5 DAYS Performed at [facility], [address] [place], [place] [address]    Report Status 01/30/2022 FINAL  Final  Urine Culture     Status: Abnormal   Collection Time: 01/25/22  8:37 PM   Specimen: In/Out Cath Urine  Result Value Ref Range Status   Specimen Description   Final    IN/OUT CATH  URINE Performed at [facility], [address], [place], [place] [address]    Special Requests   Final    NONE Performed at [facility], [address] [place], [place] [address]    Culture >=100,000 COLONIES/mL ESCHERICHIA COLI (A)  Final   Report Status 01/28/2022 FINAL  Final   Organism ID, Bacteria ESCHERICHIA COLI (A)  Final      Susceptibility   Escherichia coli - MIC*    AMPICILLIN 4 SENSITIVE Sensitive     CEFAZOLIN <=4 SENSITIVE Sensitive     CEFEPIME <=0.12 SENSITIVE Sensitive     CEFTRIAXONE <=0.25 SENSITIVE Sensitive     CIPROFLOXACIN <=0.25 SENSITIVE Sensitive     GENTAMICIN <=1 SENSITIVE Sensitive     IMIPENEM <=0.25 SENSITIVE Sensitive     NITROFURANTOIN <=16 SENSITIVE Sensitive     TRIMETH/SULFA <=20 SENSITIVE Sensitive     AMPICILLIN/SULBACTAM <=2 SENSITIVE Sensitive     PIP/TAZO <=4 SENSITIVE Sensitive     * >=100,000 COLONIES/mL ESCHERICHIA COLI  Blood Culture ID Panel (Reflexed)     Status: Abnormal   Collection Time: 01/25/22  8:37 PM  Result Value Ref Range Status   Enterococcus faecalis NOT DETECTED NOT DETECTED Final   Enterococcus Faecium NOT DETECTED NOT DETECTED Final   Listeria monocytogenes NOT DETECTED NOT DETECTED Final   Staphylococcus species NOT DETECTED NOT DETECTED Final   Staphylococcus aureus (BCID) NOT DETECTED NOT DETECTED Final   Staphylococcus epidermidis NOT DETECTED NOT DETECTED Final   Staphylococcus lugdunensis NOT DETECTED NOT DETECTED Final   Streptococcus species NOT DETECTED NOT DETECTED Final   Streptococcus agalactiae NOT DETECTED NOT DETECTED Final   Streptococcus pneumoniae NOT DETECTED NOT DETECTED Final   Streptococcus pyogenes NOT DETECTED NOT DETECTED Final   A.calcoaceticus-baumannii NOT DETECTED NOT DETECTED Final   Bacteroides fragilis NOT DETECTED NOT DETECTED Final   Enterobacterales DETECTED (A) NOT DETECTED Final    Comment: Enterobacterales represent a large order of gram negative  bacteria, not a single organism. CRITICAL RESULT CALLED TO, READ BACK BY AND VERIFIED WITH: JUSTIN MILLER AT 1306 01/26/22.PMF    Enterobacter cloacae complex NOT DETECTED NOT DETECTED Final   Escherichia coli DETECTED (A) NOT DETECTED Final  Comment: CRITICAL RESULT CALLED TO, READ BACK BY AND VERIFIED WITH: JUSTIN MILLER AT 1306 01/26/22.PMF    Klebsiella aerogenes NOT DETECTED NOT DETECTED Final   Klebsiella oxytoca NOT DETECTED NOT DETECTED Final   Klebsiella pneumoniae NOT DETECTED NOT DETECTED Final   Proteus species NOT DETECTED NOT DETECTED Final   Salmonella species NOT DETECTED NOT DETECTED Final   Serratia marcescens NOT DETECTED NOT DETECTED Final   Haemophilus influenzae NOT DETECTED NOT DETECTED Final   Neisseria meningitidis NOT DETECTED NOT DETECTED Final   Pseudomonas aeruginosa NOT DETECTED NOT DETECTED Final   Stenotrophomonas maltophilia NOT DETECTED NOT DETECTED Final   Candida albicans NOT DETECTED NOT DETECTED Final   Candida auris NOT DETECTED NOT DETECTED Final   Candida glabrata NOT DETECTED NOT DETECTED Final   Candida krusei NOT DETECTED NOT DETECTED Final   Candida parapsilosis NOT DETECTED NOT DETECTED Final   Candida tropicalis NOT DETECTED NOT DETECTED Final   Cryptococcus neoformans/gattii NOT DETECTED NOT DETECTED Final   CTX-M ESBL NOT DETECTED NOT DETECTED Final   Carbapenem resistance IMP NOT DETECTED NOT DETECTED Final   Carbapenem resistance KPC NOT DETECTED NOT DETECTED Final   Carbapenem resistance NDM NOT DETECTED NOT DETECTED Final   Carbapenem resist OXA 48 LIKE NOT DETECTED NOT DETECTED Final   Carbapenem resistance VIM NOT DETECTED NOT DETECTED Final    Comment: Performed at [facility], [address] [place], [place] [address]  MRSA Next Gen by PCR, Nasal     Status: None   Collection Time: 01/26/22  1:06 AM   Specimen: Nasal Mucosa; Nasal Swab  Result Value Ref Range Status   MRSA by PCR Next Gen NOT DETECTED NOT  DETECTED Final    Comment: (NOTE) The GeneXpert MRSA Assay (FDA approved for NASAL specimens only), is one component of a comprehensive MRSA colonization surveillance program. It is not intended to diagnose MRSA infection nor to guide or monitor treatment for MRSA infections. Test performance is not FDA approved in patients less than 58 years old. Performed at [facility], [address] [place], [place] [address]     Labs: CBC: Recent Labs  Lab 01/25/22 2026 01/26/22 0147 01/27/22 0514 01/28/22 0426 01/29/22 0405  WBC 2.5* 4.9 5.8 6.3 3.9*  NEUTROABS 2.2  --   --   --   --   HGB 13.6 11.1* 10.4* 11.4* 11.7*  HCT 39.0 31.4* 29.3* 32.0* 33.0*  MCV 99.7 100.3* 100.0 99.1 98.5  PLT 111* 51* 62* 78* 95*   Basic Metabolic Panel: Recent Labs  Lab 01/25/22 2026 01/25/22 2249 01/26/22 0147 01/27/22 0511 01/27/22 0514 01/28/22 0426 01/29/22 0405 01/30/22 0930 01/30/22 1428  NA 144  --  141  --  140 138 138  --   --   K 2.4*  --  3.6  --  3.4* 3.1* 3.2*  --  3.7  CL 105  --  107  --  108 105 104  --   --   CO2 16*  --  19*  --  23 23 22   --   --   GLUCOSE 129*  --  119*  --  107* 90 106*  --   --   BUN 15  --  15  --  17 14 15   --   --   CREATININE 1.39*  --  1.19  --  0.86 0.76 0.80  --   --   CALCIUM 7.7*  --  7.6*  --  7.7*  7.9* 8.2*  --   --   MG  --  0.9*  --  1.3*  --  1.9 1.6*  --  2.0  PHOS  --   --   --   --   --   --   --  3.2  --    Liver Function Tests: Recent Labs  Lab 01/25/22 2026 01/26/22 0147 01/27/22 0514 01/29/22 0405  AST 224* 139* 71* 36  ALT 53* 43 37 27  ALKPHOS 73 43 33* 45  BILITOT 2.6* 2.1* 1.2 1.2  PROT 6.9 6.0* 5.8* 6.1*  ALBUMIN 3.8 3.3* 3.0* 3.2*   CBG: Recent Labs  Lab 01/26/22 0104  GLUCAP 133*    Discharge time spent: greater than 30 minutes.  Signed: Jennye Boroughs, MD [facility] 01/30/2022

## 2022-01-30 NOTE — Plan of Care (Signed)

## 2022-01-30 NOTE — Progress Notes (Signed)
Pt discharged per MD order. IV removed. Discharge instructions reviewed with pts wife Marylu Lund. All questions answered to pt satisfaction. Pt taken out in wheelchair by staff.

## 2022-01-30 NOTE — TOC Progression Note (Addendum)
Transition of Care  [facility]) - Progression Note    Patient Details  Name: Elijah Murphy MRN: [id number] Date of Birth: 08-08-1955  Transition of Care [facility]) CM/SW Contact  Margarito Liner, LCSW Phone Number: 01/30/2022, 10:30 AM  Clinical Narrative:   Per wife, likely discharge this afternoon. Scheduled hospital follow up appt with PCP Carren Rang, PA-C at [facility] for Monday 12/4 at 2:00. Information added to AVS. Wife confirmed they already have a RW and shower chair at home. She is agreeable to 3-in-1. Left message for [facility] liaison to update.  10:56 am: Ordered 3-in-1 through [employer].  Expected Discharge Plan: Home w Home Health Services Barriers to Discharge: Continued Medical Work up  Expected Discharge Plan and Services Expected Discharge Plan: Home w Home Health Services   Discharge Planning Services: CM Consult Post Acute Care Choice: Home Health Living arrangements for the past 2 months: Single Family Home                 DME Arranged: N/A DME Agency: NA       HH Arranged: PT, OT           Social Determinants of Health (SDOH) Interventions    Readmission Risk Interventions     No data to display

## 2022-01-30 NOTE — Progress Notes (Signed)
Occupational Therapy Treatment Patient Details Name: Elijah Murphy MRN: [id number] DOB: 07-28-1955 Today's Date: 01/30/2022   History of present illness Elijah Murphy is a 66 y.o. 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 He was admitted to the hospital for severe sepsis secondary to UTI. Urine and blood cultures showed E. coli consistent with E. coli UTI and bacteremia.   OT comments  Elijah Murphy was seen for OT treatment on this date. Upon arrival to room pt reclined in bed, agreeable to tx. Pt requires  MIN A don depends, assist for threading over BLE and static standing balance. CGA for ADL t/f, limited by fatigeu ~5 ft. Educated on DME recs. Pt making good progress toward goals, will continue to follow POC. Discharge recommendation updated to reflect pt progress - plan to d/c home with 24/7 assist and ramp installed.     Recommendations for follow up therapy are one component of a multi-disciplinary discharge planning process, led by the attending physician.  Recommendations may be updated based on patient status, additional functional criteria and insurance authorization.    Follow Up Recommendations  Home health OT     Assistance Recommended at Discharge Frequent or constant Supervision/Assistance  Patient can return home with the following  A little help with walking and/or transfers;A little help with bathing/dressing/bathroom;Help with stairs or ramp for entrance   Equipment Recommendations  BSC/3in1    Recommendations for Other Services      Precautions / Restrictions Precautions Precautions: Fall Restrictions Weight Bearing Restrictions: No       Mobility Bed Mobility Overal bed mobility: Needs Assistance Bed Mobility: Supine to Sit     Supine to sit: Min assist           Transfers Overall transfer level: Needs assistance Equipment used: 1 person hand held assist Transfers: Sit to/from Stand, Bed to chair/wheelchair/BSC Sit to Stand: Min guard                 Balance Overall balance assessment: Needs assistance Sitting-balance support: Feet supported, Bilateral upper extremity supported Sitting balance-Leahy Scale: Fair     Standing balance support: Bilateral upper extremity supported Standing balance-Leahy Scale: Fair                             ADL either performed or assessed with clinical judgement   ADL Overall ADL's : Needs assistance/impaired                                       General ADL Comments: MIN A don depends, assist for threading over BLE and static standing balance. CGA for ADL t/f, limited by fatigeu ~5 ft      Cognition Arousal/Alertness: Awake/alert Behavior During Therapy: Flat affect Overall Cognitive Status: History of cognitive impairments - at baseline                               Problem Solving: Slow processing                     Pertinent Vitals/ Pain       Pain Assessment Pain Assessment: No/denies pain  Frequency  Min 4X/week        Progress Toward Goals  OT Goals(current goals can now be found in the care plan section)  Progress towards OT goals: Progressing toward goals  Acute Rehab OT Goals Patient Stated Goal: to go home OT Goal Formulation: With patient/family Time For Goal Achievement: 02/12/22 Potential to Achieve Goals: Good ADL Goals Pt Will Perform Grooming: with modified independence;standing Pt Will Perform Lower Body Dressing: with modified independence;sitting/lateral leans Pt Will Transfer to Toilet: with modified independence;ambulating;regular height toilet  Plan Frequency remains appropriate;Discharge plan needs to be updated    Co-evaluation                 AM-PAC OT "6 Clicks" Daily Activity      Outcome Measure   Help from another person eating meals?: A Little Help from another person taking care of personal grooming?: A Little Help from another person toileting, which includes using toliet, bedpan, or urinal?: A Little Help from another person bathing (including washing, rinsing, drying)?: A Lot Help from another person to put on and taking off regular upper body clothing?: A Little Help from another person to put on and taking off regular lower body clothing?: A Lot 6 Click Score: 16    End of Session Equipment Utilized During Treatment: Rolling walker (2 wheels)  OT Visit Diagnosis: Other abnormalities of gait and mobility (R26.89);Muscle weakness (generalized) (M62.81)   Activity Tolerance Patient tolerated treatment well   Patient Left in chair;with call bell/phone within reach;with family/visitor present   Nurse Communication Mobility status        Time: 4462-8638 OT Time Calculation (min): 14 min  Charges: OT General Charges $OT Visit: 1 Visit OT Treatments $Self Care/Home Management : 8-22 mins  Kathie Dike, M.S. OTR/L  01/30/22, 4:01 PM  ascom [phone]

## 2022-01-30 NOTE — TOC CM/SW Note (Signed)
Patient is not able to walk the distance required to go the bathroom, or he/she is unable to safely negotiate stairs required to access the bathroom.  A 3in1 BSC will alleviate this problem  

## 2022-01-30 NOTE — TOC Transition Note (Signed)
Transition of Care [facility]) - CM/SW Discharge Note   Patient Details  Name: TRAVORIS BUSHEY MRN: [id number] Date of Birth: 04-Dec-1955  Transition of Care [facility]) CM/SW Contact:  Margarito Liner, LCSW Phone Number: 01/30/2022, 3:54 PM   Clinical Narrative:  Patient has orders to discharge home today. [facility] liaison is aware. No further concerns. CSW signing off.   Final next level of care: Home w Home Health Services Barriers to Discharge: Barriers Resolved   Patient Goals and CMS Choice Patient states their goals for this hospitalization and ongoing recovery are:: patient wants to get better CMS Medicare.gov Compare Post Acute Care list provided to:: Patient Choice offered to / list presented to : Patient, Spouse  Discharge Placement                  Name of family member notified: Issaiah Seabrooks Patient and family notified of of transfer: 01/30/22  Discharge Plan and Services   Discharge Planning Services: CM Consult Post Acute Care Choice: Home Health          DME Arranged: 3-N-1 DME Agency: [employer] Date DME Agency Contacted: 01/30/22   Representative spoke with at DME Agency: Demetra Shiner HH Arranged: RN, PT, OT, Nurse's Aide [facility] Agency: [facility] Date [facility] Agency Contacted: 01/30/22   Representative spoke with at [facility] Agency: [employer] [facility]  Social Determinants of Health (SDOH) Interventions     Readmission Risk Interventions     No data to display

## 2022-01-30 NOTE — Progress Notes (Signed)
Daily Progress Note   Patient Name: Elijah Murphy       Date: 01/30/2022 DOB: June 17, 1955  Age: 66 y.o. MRN#: [id number] Attending Physician: Lurene Shadow, MD Primary Care Physician: Carren Rang, PA-C Admit Date: 01/25/2022  Reason for Consultation/Follow-up: Establishing goals of care  Subjective: Notes and labs reviewed. In to see patient. His wife is at bedside. She discusses plans for discharge. We discussed nutrition. We discussed life style and QOL. She states she is hopeful he will be willing to leave home and continue the things that made him happy pre-covid restrictions.   Patient states he is willing now to see doctors and have heatlh care. He states he is willing to stop drinking alcohol. He is happy for D/C home.   Length of Stay: 5  Current Medications: Scheduled Meds:   amoxicillin  1,000 mg Oral Q8H   enoxaparin (LOVENOX) injection  40 mg Subcutaneous QHS   folic acid  1 mg Oral Daily   lisinopril  20 mg Oral Daily   multivitamin with minerals  1 tablet Oral Daily   nicotine  14 mg Transdermal Daily   pantoprazole  40 mg Oral BID   potassium chloride  40 mEq Oral Q4H   sertraline  25 mg Oral Daily   thiamine  100 mg Oral Daily   Or   thiamine  100 mg Intravenous Daily    Continuous Infusions:  sodium chloride 10 mL/hr at 01/29/22 0208    PRN Meds: sodium chloride, acetaminophen **OR** acetaminophen, hydrALAZINE, mouth rinse, traMADol  Physical Exam Pulmonary:     Effort: Pulmonary effort is normal.  Neurological:     Mental Status: He is alert.             Vital Signs: BP (!) 171/90 (BP Location: Left Arm)   Pulse 83   Temp (!) 97.5 F (36.4 C) (Oral)   Resp 18   Ht 5\' 9"  (1.753 m)   Wt 75.8 kg   SpO2 97%   BMI 24.68 kg/m  SpO2: SpO2: 97  % O2 Device: O2 Device: Room Air O2 Flow Rate: O2 Flow Rate (L/min): 2 L/min  Intake/output summary:  Intake/Output Summary (Last 24 hours) at 01/30/2022 1434 Last data filed at 01/30/2022 0647 Gross per 24 hour  Intake 180 ml  Output 1000 ml  Net -820 ml   LBM: Last BM Date : 01/27/22 Baseline Weight: Weight: 70.3 kg Most recent weight: Weight: 75.8 kg         Patient Active Problem List   Diagnosis Date Noted   PSA elevation 01/30/2022   Bacterial infection due to E. 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   Hematemesis with nausea    Diarrhea    Acute gastroenteritis    Anemia    Sepsis secondary to UTI (HCC) 02/05/2020   AKI (acute kidney injury) (HCC) 02/05/2020   Lactic acidosis 02/05/2020   Acute metabolic encephalopathy 02/05/2020   History of UTI 02/05/2020   Essential hypertension 08/10/2016    Palliative Care Assessment & Plan    Recommendations/Plan: PMT will sign off. D/Cing home.   Code Status:    Code Status Orders  (From admission, onward)           Start     Ordered   01/25/22 2309  Full code  Continuous        01/25/22 2311           Code Status History     Date Active Date Inactive Code Status Order ID Comments User Context   02/05/2020 2241 02/08/2020 2304 Full Code [id number]  Andris Baumann, MD ED       Prognosis:  Unable to determine   Thank you for allowing the Palliative Medicine Team to assist in the care of this patient.    Morton Stall, NP  Please contact Palliative Medicine Team phone at [phone] for questions and concerns.

## 2022-08-07 ENCOUNTER — Other Ambulatory Visit: Payer: Self-pay | Admitting: Orthopedic Surgery

## 2022-08-07 DIAGNOSIS — M25562 Pain in left knee: Secondary | ICD-10-CM

## 2022-08-14 ENCOUNTER — Encounter: Payer: Self-pay | Admitting: Orthopedic Surgery

## 2022-09-14 ENCOUNTER — Ambulatory Visit
Admission: RE | Admit: 2022-09-14 | Discharge: 2022-09-14 | Disposition: A | Discharge status: Home / Self Care | Payer: Medicare HMO | Source: Ambulatory Visit | Attending: Orthopedic Surgery | Admitting: Orthopedic Surgery

## 2022-09-14 DIAGNOSIS — M25562 Pain in left knee: Secondary | ICD-10-CM

## 2022-10-18 ENCOUNTER — Ambulatory Visit: Payer: Medicare HMO | Admitting: Urology

## 2022-10-18 ENCOUNTER — Encounter: Payer: Self-pay | Admitting: Urology

## 2022-10-18 VITALS — BP 118/89 | HR 67 | Ht 69.0 in | Wt 160.0 lb

## 2022-10-18 DIAGNOSIS — R339 Retention of urine, unspecified: Secondary | ICD-10-CM | POA: Diagnosis not present

## 2022-10-18 DIAGNOSIS — R972 Elevated prostate specific antigen [PSA]: Secondary | ICD-10-CM | POA: Diagnosis not present

## 2022-10-18 DIAGNOSIS — N401 Enlarged prostate with lower urinary tract symptoms: Secondary | ICD-10-CM

## 2022-10-18 DIAGNOSIS — R3911 Hesitancy of micturition: Secondary | ICD-10-CM

## 2022-10-18 LAB — BLADDER SCAN AMB NON-IMAGING: Scan Result: 370

## 2022-10-18 MED ORDER — SILODOSIN 4 MG PO CAPS: 4.0000 mg | ORAL_CAPSULE | Freq: Every day | ORAL | 1 refills | Status: DC

## 2022-10-18 NOTE — Progress Notes (Signed)
I, Duke Salvia, acting as a Neurosurgeon for Riki Altes, MD., have documented all relevant documentation on the behalf of Riki Altes, MD, as directed by  Riki Altes, MD while in the presence of Riki Altes, MD.  10/18/2022 3:46 PM   Elijah Murphy 20-Dec-1955 [id number]  Referring provider: Carren Rang, PA-C [address] [place],  [place] [address]  Chief Complaint  Patient presents with   Elevated PSA    HPI: Elijah Murphy is a 67 y.o. male referred for evaluation of an elevated PSA.  PSA drawn 05/09/2022, slightly elevated at 4.68 and a repeat PSA 06/27/22 was 4.97. On record review, he had a PSA of 4.06 March 2019. He was admitted to [facility] November 2023 for sepsis from a urinary source with septic shock. For unclear reasons, a PSA was drawn during that hospitalization, which was elevated at 55.  Over the past yeat, his wife states he has had urinary hesitancy and decreased force and caliber of his urinary stream. Presently denies dysuria or gross hematuria.   PMH: Past Medical History:  Diagnosis Date   E. coli infection    Hyperlipidemia 08/10/2016   Hypertension     Surgical History: Past Surgical History:  Procedure Laterality Date   APPENDECTOMY     KNEE SURGERY Left    TONSILLECTOMY      Home Medications:  Allergies as of 10/18/2022       Reactions   Sulfa Antibiotics Hives        Medication List        Accurate as of October 18, 2022  3:46 PM. If you have any questions, ask your nurse or doctor.          STOP taking these medications    potassium chloride 10 MEQ tablet Commonly known as: KLOR-CON M Stopped by: Riki Altes       TAKE these medications    lisinopril 20 MG tablet Commonly known as: ZESTRIL Take 1 tablet (20 mg total) by mouth daily.   sertraline 25 MG tablet Commonly known as: ZOLOFT Take 1 tablet (25 mg total) by mouth daily.   silodosin 4 MG Caps capsule Commonly known  as: RAPAFLO Take 1 capsule (4 mg total) by mouth daily with breakfast. Started by: Riki Altes        Allergies:  Allergies  Allergen Reactions   Sulfa Antibiotics Hives     Social History:  reports that he has never smoked. His smokeless tobacco use includes chew. He reports current alcohol use. He reports that he does not use drugs.   Physical Exam: BP 118/89   Pulse 67   Ht 5\' 9"  (1.753 m)   Wt 160 lb (72.6 kg)   BMI 23.63 kg/m   Constitutional:  Alert and oriented, No acute distress. HEENT: [place] AT, moist mucus membranes.  Trachea midline, no masses. Cardiovascular: No clubbing, cyanosis, or edema. Respiratory: Normal respiratory effort, no increased work of breathing. GI: Abdomen is soft, nontender, nondistended, no abdominal masses GU: Prostate 60 g, smooth without nodules. Skin: No rashes, bruises or suspicious lesions. Neurologic: Grossly intact, no focal deficits, moving all 4 extremities. Psychiatric: Normal mood and affect.    Assessment & Plan:    1. Elevated PSA Benign DRE. PAS is mildly elevated by strict criteria and normal by age specific guidelines. PSA in 2021 was 4.1 and there has been no significant change when compared with his recent PSAs and  suspect they are secondary to BPH. Recommend monitoring with a follow up PSA October 2024.  2. BPH with LUTS Obstructive voiding symptoms which are bothersome. PVR today was 370 mL. Start Silodosin 4 mg daily. 1 month follow up with UA, PVR, and symptom reassessment.  3.  Incomplete bladder emptying As above  I have reviewed the above documentation for accuracy and completeness, and I agree with the above.   Riki Altes, MD  [facility] [address] [place], [place] [address] [phone]

## 2022-11-09 ENCOUNTER — Other Ambulatory Visit: Payer: Self-pay | Admitting: Student

## 2022-11-09 ENCOUNTER — Encounter: Payer: Self-pay | Admitting: Urology

## 2022-11-09 DIAGNOSIS — G3184 Mild cognitive impairment, so stated: Secondary | ICD-10-CM

## 2022-11-11 ENCOUNTER — Other Ambulatory Visit: Payer: Self-pay | Admitting: Urology

## 2022-11-19 ENCOUNTER — Ambulatory Visit: Payer: Medicare HMO | Admitting: Urology

## 2022-12-05 ENCOUNTER — Other Ambulatory Visit: Payer: Self-pay

## 2022-12-05 ENCOUNTER — Emergency Department: Payer: Medicare HMO

## 2022-12-05 ENCOUNTER — Observation Stay
Admission: EM | Admit: 2022-12-05 | Discharge: 2022-12-06 | Disposition: A | Discharge status: Home / Self Care | Payer: Medicare HMO | Attending: Internal Medicine | Admitting: Internal Medicine

## 2022-12-05 DIAGNOSIS — R531 Weakness: Principal | ICD-10-CM | POA: Insufficient documentation

## 2022-12-05 DIAGNOSIS — E871 Hypo-osmolality and hyponatremia: Secondary | ICD-10-CM | POA: Insufficient documentation

## 2022-12-05 DIAGNOSIS — F17220 Nicotine dependence, chewing tobacco, uncomplicated: Secondary | ICD-10-CM | POA: Diagnosis not present

## 2022-12-05 DIAGNOSIS — Z1152 Encounter for screening for COVID-19: Secondary | ICD-10-CM | POA: Diagnosis not present

## 2022-12-05 DIAGNOSIS — I10 Essential (primary) hypertension: Secondary | ICD-10-CM | POA: Insufficient documentation

## 2022-12-05 DIAGNOSIS — R110 Nausea: Secondary | ICD-10-CM | POA: Diagnosis not present

## 2022-12-05 DIAGNOSIS — E8729 Other acidosis: Secondary | ICD-10-CM

## 2022-12-05 DIAGNOSIS — Z79899 Other long term (current) drug therapy: Secondary | ICD-10-CM | POA: Diagnosis not present

## 2022-12-05 DIAGNOSIS — E872 Acidosis, unspecified: Secondary | ICD-10-CM | POA: Diagnosis not present

## 2022-12-05 LAB — BASIC METABOLIC PANEL
Anion gap: 24 — ABNORMAL HIGH (ref 5–15)
BUN: 12 mg/dL (ref 8–23)
CO2: 16 mmol/L — ABNORMAL LOW (ref 22–32)
Calcium: 9.2 mg/dL (ref 8.9–10.3)
Chloride: 87 mmol/L — ABNORMAL LOW (ref 98–111)
Creatinine, Ser: 0.87 mg/dL (ref 0.61–1.24)
GFR, Estimated: >60 mL/min (ref >60)
Glucose, Bld: 94 mg/dL (ref 70–99)
Potassium: 3.5 mmol/L (ref 3.5–5.1)
Sodium: 127 mmol/L — ABNORMAL LOW (ref 135–145)

## 2022-12-05 LAB — URINALYSIS, ROUTINE W REFLEX MICROSCOPIC
Bilirubin Urine: NEGATIVE
Glucose, UA: NEGATIVE mg/dL
Hgb urine dipstick: NEGATIVE
Ketones, ur: 80 mg/dL — AB
Leukocytes,Ua: NEGATIVE
Nitrite: NEGATIVE
Protein, ur: NEGATIVE mg/dL
Specific Gravity, Urine: 1.012 (ref 1.005–1.030)
pH: 6 (ref 5.0–8.0)

## 2022-12-05 LAB — CBC
HCT: 46.6 % (ref 39.0–52.0)
Hemoglobin: 16.4 g/dL (ref 13.0–17.0)
MCH: 32.2 pg (ref 26.0–34.0)
MCHC: 35.2 g/dL (ref 30.0–36.0)
MCV: 91.4 fL (ref 80.0–100.0)
Platelets: 217 10*3/uL (ref 150–400)
RBC: 5.1 MIL/uL (ref 4.22–5.81)
RDW: 13 % (ref 11.5–15.5)
WBC: 11 10*3/uL — ABNORMAL HIGH (ref 4.0–10.5)
nRBC: 0 % (ref 0.0–0.2)

## 2022-12-05 LAB — RESP PANEL BY RT-PCR (RSV, FLU A&B, COVID)  RVPGX2
Influenza A by PCR: NEGATIVE
Influenza B by PCR: NEGATIVE
Resp Syncytial Virus by PCR: NEGATIVE
SARS Coronavirus 2 by RT PCR: NEGATIVE

## 2022-12-05 LAB — HEPATIC FUNCTION PANEL
ALT: 23 U/L (ref 0–44)
AST: 34 U/L (ref 15–41)
Albumin: 4.8 g/dL (ref 3.5–5.0)
Alkaline Phosphatase: 78 U/L (ref 38–126)
Bilirubin, Direct: 0.6 mg/dL — ABNORMAL HIGH (ref 0.0–0.2)
Indirect Bilirubin: 1.8 mg/dL — ABNORMAL HIGH (ref 0.3–0.9)
Total Bilirubin: 2.4 mg/dL — ABNORMAL HIGH (ref 0.3–1.2)
Total Protein: 8.1 g/dL (ref 6.5–8.1)

## 2022-12-05 LAB — LACTIC ACID, PLASMA
Lactic Acid, Venous: 2.9 mmol/L (ref 0.5–1.9)
Lactic Acid, Venous: 1.3 mmol/L (ref 0.5–1.9)

## 2022-12-05 LAB — PROCALCITONIN: Procalcitonin: <0.1 ng/mL

## 2022-12-05 LAB — MAGNESIUM: Magnesium: 1.7 mg/dL (ref 1.7–2.4)

## 2022-12-05 LAB — ETHANOL: Alcohol, Ethyl (B): <10 mg/dL (ref <10)

## 2022-12-05 MED ORDER — ACETAMINOPHEN 650 MG RE SUPP: 650.0000 mg | Freq: Four times a day (QID) | RECTAL | Status: DC | PRN

## 2022-12-05 MED ORDER — POTASSIUM CHLORIDE IN NACL 20-0.9 MEQ/L-% IV SOLN
INTRAVENOUS | Status: DC
  Filled 2022-12-05: qty 1000

## 2022-12-05 MED ORDER — MAGNESIUM HYDROXIDE 400 MG/5ML PO SUSP: 30.0000 mL | Freq: Every day | ORAL | Status: DC | PRN

## 2022-12-05 MED ORDER — ONDANSETRON HCL 4 MG PO TABS: 4.0000 mg | ORAL_TABLET | Freq: Four times a day (QID) | ORAL | Status: DC | PRN

## 2022-12-05 MED ORDER — ACETAMINOPHEN 325 MG PO TABS: 650.0000 mg | ORAL_TABLET | Freq: Four times a day (QID) | ORAL | Status: DC | PRN

## 2022-12-05 MED ORDER — LORAZEPAM 1 MG PO TABS
1.0000 mg | ORAL_TABLET | ORAL | Status: DC | PRN
  Administered 2022-12-05: 2 mg via ORAL
  Filled 2022-12-05: qty 2

## 2022-12-05 MED ORDER — LORAZEPAM 1 MG PO TABS: 1.0000 mg | ORAL_TABLET | ORAL | Status: DC | PRN

## 2022-12-05 MED ORDER — TAMSULOSIN HCL 0.4 MG PO CAPS: 0.4000 mg | ORAL_CAPSULE | Freq: Every day | ORAL | Status: DC

## 2022-12-05 MED ORDER — LISINOPRIL 10 MG PO TABS: 20.0000 mg | ORAL_TABLET | Freq: Every day | ORAL | Status: DC

## 2022-12-05 MED ORDER — SODIUM CHLORIDE 0.9 % IV SOLN
2.0000 g | INTRAVENOUS | Status: DC
  Administered 2022-12-05: 2 g via INTRAVENOUS
  Filled 2022-12-05: qty 20

## 2022-12-05 MED ORDER — ONDANSETRON HCL 4 MG/2ML IJ SOLN
4.0000 mg | Freq: Once | INTRAMUSCULAR | Status: AC
  Administered 2022-12-05: 4 mg via INTRAVENOUS
  Filled 2022-12-05: qty 2

## 2022-12-05 MED ORDER — SERTRALINE HCL 50 MG PO TABS: 25.0000 mg | ORAL_TABLET | Freq: Every day | ORAL | Status: DC

## 2022-12-05 MED ORDER — ONDANSETRON HCL 4 MG/2ML IJ SOLN
4.0000 mg | Freq: Four times a day (QID) | INTRAMUSCULAR | Status: DC | PRN
  Administered 2022-12-05: 4 mg via INTRAVENOUS
  Filled 2022-12-05: qty 2

## 2022-12-05 MED ORDER — ENOXAPARIN SODIUM 40 MG/0.4ML IJ SOSY
40.0000 mg | PREFILLED_SYRINGE | INTRAMUSCULAR | Status: DC
  Administered 2022-12-05: 40 mg via SUBCUTANEOUS
  Filled 2022-12-05: qty 0.4

## 2022-12-05 MED ORDER — SODIUM CHLORIDE 0.9 % IV BOLUS
1000.0000 mL | Freq: Once | INTRAVENOUS | Status: AC
  Administered 2022-12-05: 1000 mL via INTRAVENOUS

## 2022-12-05 MED ORDER — LORAZEPAM 2 MG/ML IJ SOLN: 1.0000 mg | INTRAMUSCULAR | Status: DC | PRN

## 2022-12-05 MED ORDER — TRAZODONE HCL 50 MG PO TABS: 25.0000 mg | ORAL_TABLET | Freq: Every evening | ORAL | Status: DC | PRN

## 2022-12-05 NOTE — Assessment & Plan Note (Signed)
-   This is likely contributing to his generalized weakness. - Will hydrate with IV normal saline and follow BMPs. - Will obtain hyponatremia workup.

## 2022-12-05 NOTE — ED Notes (Signed)
ED TO INPATIENT HANDOFF REPORT  ED Nurse Name and Phone #: Raynelle Fanning, RN  S Name/Age/Gender Elijah Murphy 67 y.o. male Room/Bed: ED26A/ED26A  Code Status   Code Status: Full Code  Home/SNF/Other Home Patient oriented to: self, place, time, and situation Is this baseline? Yes   Triage Complete: Triage complete  Chief Complaint Generalized weakness [R53.1]  Triage Note Pt sts that he has been having UTI s/s and feeling weak. Pt wife sts that pt is probably dehydrated since pt has not been eating or drinking for the last few days.    Allergies Allergies  Allergen Reactions   Sulfa Antibiotics Hives    Level of Care/Admitting Diagnosis ED Disposition     ED Disposition  Admit   Condition  --   Comment  Hospital Area: [facility] [[id number]]  Level of Care: Med-Surg [16]  Covid Evaluation: Asymptomatic - no recent exposure (last 10 days) testing not required  Diagnosis: Generalized weakness [161096]  Admitting Physician: Hannah Beat [[id number]]  Attending Physician: Hannah Beat [[id number]]          B Medical/Surgery History Past Medical History:  Diagnosis Date   E. coli infection    Hyperlipidemia 08/10/2016   Hypertension    Past Surgical History:  Procedure Laterality Date   APPENDECTOMY     KNEE SURGERY Left    TONSILLECTOMY       A IV Location/Drains/Wounds Patient Lines/Drains/Airways Status     Active Line/Drains/Airways     Name Placement date Placement time Site Days   Peripheral IV 12/05/22 20 G Anterior;Right Forearm 12/05/22  1646  Forearm  less than 1   Peripheral IV 12/05/22 20 G Anterior;Distal;Left Forearm 12/05/22  1646  Forearm  less than 1            Intake/Output Last 24 hours  Intake/Output Summary (Last 24 hours) at 12/05/2022 2031 Last data filed at 12/05/2022 2028 Gross per 24 hour  Intake 1000 ml  Output --  Net 1000 ml    Labs/Imaging Results for orders placed or performed during the  hospital encounter of 12/05/22 (from the past 48 hour(s))  Urinalysis, Routine w reflex microscopic -Urine, Clean Catch     Status: Abnormal   Collection Time: 12/05/22  3:17 PM  Result Value Ref Range   Color, Urine YELLOW (A) YELLOW   APPearance CLEAR (A) CLEAR   Specific Gravity, Urine 1.012 1.005 - 1.030   pH 6.0 5.0 - 8.0   Glucose, UA NEGATIVE NEGATIVE mg/dL   Hgb urine dipstick NEGATIVE NEGATIVE   Bilirubin Urine NEGATIVE NEGATIVE   Ketones, ur 80 (A) NEGATIVE mg/dL   Protein, ur NEGATIVE NEGATIVE mg/dL   Nitrite NEGATIVE NEGATIVE   Leukocytes,Ua NEGATIVE NEGATIVE    Comment: Performed at [facility], [address] [place], [place] [address]  Basic metabolic panel     Status: Abnormal   Collection Time: 12/05/22  3:17 PM  Result Value Ref Range   Sodium 127 (L) 135 - 145 mmol/L    Comment: ELECTROLYTES REPEATED TO VERIFY MU   Potassium 3.5 3.5 - 5.1 mmol/L   Chloride 87 (L) 98 - 111 mmol/L   CO2 16 (L) 22 - 32 mmol/L   Glucose, Bld 94 70 - 99 mg/dL    Comment: Glucose reference range applies only to samples taken after fasting for at least 8 hours.   BUN 12 8 - 23 mg/dL   Creatinine, Ser 1.30 0.61 - 1.24 mg/dL  Calcium 9.2 8.9 - 10.3 mg/dL   GFR, Estimated >16 >10 mL/min    Comment: (NOTE) Calculated using the CKD-EPI Creatinine Equation (2021)    Anion gap 24 (H) 5 - 15    Comment: Performed at [facility], [address] [place], [place] [address]  CBC     Status: Abnormal   Collection Time: 12/05/22  3:17 PM  Result Value Ref Range   WBC 11.0 (H) 4.0 - 10.5 K/uL   RBC 5.10 4.22 - 5.81 MIL/uL   Hemoglobin 16.4 13.0 - 17.0 g/dL   HCT 40.9 81.1 - 91.4 %   MCV 91.4 80.0 - 100.0 fL   MCH 32.2 26.0 - 34.0 pg   MCHC 35.2 30.0 - 36.0 g/dL   RDW 78.2 95.6 - 21.3 %   Platelets 217 150 - 400 K/uL   nRBC 0.0 0.0 - 0.2 %    Comment: Performed at [facility], [address] [place], [place] [address]  Lactic acid, plasma     Status:  Abnormal   Collection Time: 12/05/22  4:39 PM  Result Value Ref Range   Lactic Acid, Venous 2.9 (HH) 0.5 - 1.9 mmol/L    Comment: CRITICAL RESULT CALLED TO, READ BACK BY AND VERIFIED WITH LOGAN RIGGINS AT 1721 ON 12/05/22 BY SS Performed at [facility], [address] [place], [place] [address]   Hepatic function panel     Status: Abnormal   Collection Time: 12/05/22  4:46 PM  Result Value Ref Range   Total Protein 8.1 6.5 - 8.1 g/dL   Albumin 4.8 3.5 - 5.0 g/dL   AST 34 15 - 41 U/L   ALT 23 0 - 44 U/L   Alkaline Phosphatase 78 38 - 126 U/L   Total Bilirubin 2.4 (H) 0.3 - 1.2 mg/dL   Bilirubin, Direct 0.6 (H) 0.0 - 0.2 mg/dL   Indirect Bilirubin 1.8 (H) 0.3 - 0.9 mg/dL    Comment: Performed at [facility], [address] [place], [place] [address]  Procalcitonin     Status: None   Collection Time: 12/05/22  4:46 PM  Result Value Ref Range   Procalcitonin <0.10 ng/mL    Comment:        Interpretation: PCT (Procalcitonin) <= 0.5 ng/mL: Systemic infection (sepsis) is not likely. Local bacterial infection is possible. (NOTE)       Sepsis PCT Algorithm           Lower Respiratory Tract                                      Infection PCT Algorithm    ----------------------------     ----------------------------         PCT < 0.25 ng/mL                PCT < 0.10 ng/mL          Strongly encourage             Strongly discourage   discontinuation of antibiotics    initiation of antibiotics    ----------------------------     -----------------------------       PCT 0.25 - 0.50 ng/mL            PCT 0.10 - 0.25 ng/mL               OR       >  80% decrease in PCT            Discourage initiation of                                            antibiotics      Encourage discontinuation           of antibiotics    ----------------------------     -----------------------------         PCT >= 0.50 ng/mL              PCT 0.26 - 0.50 ng/mL               AND        <80%  decrease in PCT             Encourage initiation of                                             antibiotics       Encourage continuation           of antibiotics    ----------------------------     -----------------------------        PCT >= 0.50 ng/mL                  PCT > 0.50 ng/mL               AND         increase in PCT                  Strongly encourage                                      initiation of antibiotics    Strongly encourage escalation           of antibiotics                                     -----------------------------                                           PCT <= 0.25 ng/mL                                                 OR                                        > 80% decrease in PCT                                      Discontinue / Do not initiate  antibiotics  Performed at [facility] [address] [place], [place] [address]   Resp panel by RT-PCR (RSV, Flu A&B, Covid) Anterior Nasal Swab     Status: None   Collection Time: 12/05/22  4:46 PM   Specimen: Anterior Nasal Swab  Result Value Ref Range   SARS Coronavirus 2 by RT PCR NEGATIVE NEGATIVE    Comment: (NOTE) SARS-CoV-2 target nucleic acids are NOT DETECTED.  The SARS-CoV-2 RNA is generally detectable in upper respiratory specimens during the acute phase of infection. The lowest concentration of SARS-CoV-2 viral copies this assay can detect is 138 copies/mL. A negative result does not preclude SARS-Cov-2 infection and should not be used as the sole basis for treatment or other patient management decisions. A negative result may occur with  improper specimen collection/handling, submission of specimen other than nasopharyngeal swab, presence of viral mutation(s) within the areas targeted by this assay, and inadequate number of viral copies(<138 copies/mL). A negative result must be combined with clinical observations, patient history,  and epidemiological information. The expected result is Negative.  Fact Sheet for Patients:  BloggerCourse.com  Fact Sheet for Healthcare Providers:  SeriousBroker.it  This test is no t yet approved or cleared by the [place] FDA and  has been authorized for detection and/or diagnosis of SARS-CoV-2 by FDA under an Emergency Use Authorization (EUA). This EUA will remain  in effect (meaning this test can be used) for the duration of the COVID-19 declaration under Section 564(b)(1) of the Act, 21 U.S.C.section 360bbb-3(b)(1), unless the authorization is terminated  or revoked sooner.       Influenza A by PCR NEGATIVE NEGATIVE   Influenza B by PCR NEGATIVE NEGATIVE    Comment: (NOTE) The Xpert Xpress SARS-CoV-2/FLU/RSV plus assay is intended as an aid in the diagnosis of influenza from Nasopharyngeal swab specimens and should not be used as a sole basis for treatment. Nasal washings and aspirates are unacceptable for Xpert Xpress SARS-CoV-2/FLU/RSV testing.  Fact Sheet for Patients: BloggerCourse.com  Fact Sheet for Healthcare Providers: SeriousBroker.it  This test is not yet approved or cleared by the [place] FDA and has been authorized for detection and/or diagnosis of SARS-CoV-2 by FDA under an Emergency Use Authorization (EUA). This EUA will remain in effect (meaning this test can be used) for the duration of the COVID-19 declaration under Section 564(b)(1) of the Act, 21 U.S.C. section 360bbb-3(b)(1), unless the authorization is terminated or revoked.     Resp Syncytial Virus by PCR NEGATIVE NEGATIVE    Comment: (NOTE) Fact Sheet for Patients: BloggerCourse.com  Fact Sheet for Healthcare Providers: SeriousBroker.it  This test is not yet approved or cleared by the [place] FDA and has been authorized for  detection and/or diagnosis of SARS-CoV-2 by FDA under an Emergency Use Authorization (EUA). This EUA will remain in effect (meaning this test can be used) for the duration of the COVID-19 declaration under Section 564(b)(1) of the Act, 21 U.S.C. section 360bbb-3(b)(1), unless the authorization is terminated or revoked.  Performed at [facility], [address] [place], [place] [address]   Ethanol     Status: None   Collection Time: 12/05/22  4:46 PM  Result Value Ref Range   Alcohol, Ethyl (B) <10 <10 mg/dL    Comment: (NOTE) Lowest detectable limit for serum alcohol is 10 mg/dL.  For medical purposes only. Performed at [facility], [address], [place], [place] [address]   Magnesium     Status: None  Collection Time: 12/05/22  4:46 PM  Result Value Ref Range   Magnesium 1.7 1.7 - 2.4 mg/dL    Comment: Performed at [facility], [address] [place], [place] [address]  Lactic acid, plasma     Status: None   Collection Time: 12/05/22  6:41 PM  Result Value Ref Range   Lactic Acid, Venous 1.3 0.5 - 1.9 mmol/L    Comment: Performed at [facility], [address], [place], [place] [address]   DG Chest 2 View  Result Date: 12/05/2022 CLINICAL DATA:  Weakness. EXAM: CHEST - 2 VIEW COMPARISON:  AP chest 01/25/2022 FINDINGS: Cardiac silhouette and mediastinal contours are within normal limits. Mild calcification within the aortic arch. The lungs are clear. No pleural effusion or pneumothorax. Multilevel anterior bridging osteophytes at the mid to lower thoracic spine. IMPRESSION: No active cardiopulmonary disease. Electronically Signed   By: Neita Garnet M.D.   On: 12/05/2022 17:43    Pending Labs Unresulted Labs (From admission, onward)     Start     Ordered   12/12/22 0500  Creatinine, serum  (enoxaparin (LOVENOX)    CrCl >/= 30 ml/min)  Weekly,   TIMED     Comments: while on enoxaparin therapy    12/05/22 2007   12/06/22 0500   Basic metabolic panel  Tomorrow morning,   R        12/05/22 2007   12/06/22 0500  CBC  Tomorrow morning,   R        12/05/22 2007   12/05/22 1639  Culture, blood (routine x 2)  BLOOD CULTURE X 2,   STAT      12/05/22 1639            Vitals/Pain Today's Vitals   12/05/22 1800 12/05/22 1830 12/05/22 1900 12/05/22 1930  BP: (!) 154/78 134/86 (!) 173/99 (!) 156/109  Pulse:  96 93 95  Resp: 14 16 19 19   Temp:      TempSrc:      SpO2:  90% 94% 92%  Weight:      Height:      PainSc:        Isolation Precautions No active isolations  Medications Medications  cefTRIAXone (ROCEPHIN) 2 g in sodium chloride 0.9 % 100 mL IVPB (0 g Intravenous Stopped 12/05/22 1843)  lisinopril (ZESTRIL) tablet 20 mg (has no administration in time range)  sertraline (ZOLOFT) tablet 25 mg (has no administration in time range)  tamsulosin (FLOMAX) capsule 0.4 mg (has no administration in time range)  enoxaparin (LOVENOX) injection 40 mg (has no administration in time range)  0.9 % NaCl with KCl 20 mEq/ L  infusion (has no administration in time range)  acetaminophen (TYLENOL) tablet 650 mg (has no administration in time range)    Or  acetaminophen (TYLENOL) suppository 650 mg (has no administration in time range)  traZODone (DESYREL) tablet 25 mg (has no administration in time range)  magnesium hydroxide (MILK OF MAGNESIA) suspension 30 mL (has no administration in time range)  ondansetron (ZOFRAN) tablet 4 mg (has no administration in time range)    Or  ondansetron (ZOFRAN) injection 4 mg (has no administration in time range)  LORazepam (ATIVAN) injection 1 mg (has no administration in time range)  sodium chloride 0.9 % bolus 1,000 mL (0 mLs Intravenous Stopped 12/05/22 1806)  ondansetron (ZOFRAN) injection 4 mg (4 mg Intravenous Given 12/05/22 1657)  sodium chloride 0.9 % bolus 1,000 mL (0 mLs Intravenous Stopped 12/05/22 2028)  Mobility walks     Focused Assessments Cardiac Assessment  Handoff:    Lab Results  Component Value Date   CKTOTAL 98 01/25/2022   TROPONINI < 0.02 04/23/2013   No results found for: "DDIMER" Does the Patient currently have chest pain? No    R Recommendations: See Admitting Provider Note  Report given to:   Additional Notes: Pt is A&O x4, being admitted for generalized weakness

## 2022-12-05 NOTE — ED Provider Notes (Signed)
[facility] Provider Note    Event Date/Time   First MD Initiated Contact with Patient 12/05/22 1606     (approximate)   History   Chief Complaint Weakness   HPI  Elijah Murphy is a 67 y.o. male with past medical history of hypertension, anemia, and stroke who presents to the ED complaining of weakness.  Patient's wife reports that he has been feeling increasingly weak with poor appetite for the past couple of days.  He has not had any nausea, vomiting, or diarrhea but she states he has only had small amounts of liquids to drink and has not had any solids for a couple of days.  He has not had any fevers, but wife states he has gotten like this in the past when he had to be admitted for septic shock due to UTI.  Patient denies any cough, chest pain, shortness of breath, abdominal pain, dysuria, hematuria, or flank pain.  Wife does report that he started drinking alcohol again about a week ago, has been having 6 ounces of bourbon daily up until today.     Physical Exam   Triage Vital Signs: ED Triage Vitals  Encounter Vitals Group     BP 12/05/22 1515 (!) 137/95     Systolic BP Percentile --      Diastolic BP Percentile --      Pulse Rate 12/05/22 1515 100     Resp 12/05/22 1515 17     Temp 12/05/22 1515 98.2 F (36.8 C)     Temp Source 12/05/22 1515 Oral     SpO2 12/05/22 1515 96 %     Weight 12/05/22 1511 155 lb (70.3 kg)     Height 12/05/22 1511 5\' 9"  (1.753 m)     Head Circumference --      Peak Flow --      Pain Score 12/05/22 1511 0     Pain Loc --      Pain Education --      Exclude from Growth Chart --     Most recent vital signs: Vitals:   12/05/22 1900 12/05/22 1930  BP: (!) 173/99 (!) 156/109  Pulse: 93 95  Resp: 19 19  Temp:    SpO2: 94% 92%    Constitutional: Alert and oriented. Eyes: Conjunctivae are normal. Head: Atraumatic. Nose: No congestion/rhinnorhea. Mouth/Throat: Mucous membranes are dry. Cardiovascular: Normal  rate, regular rhythm. Grossly normal heart sounds.  2+ radial pulses bilaterally. Respiratory: Normal respiratory effort.  No retractions. Lungs CTAB. Gastrointestinal: Soft and nontender. No distention. Musculoskeletal: No lower extremity tenderness nor edema.  Neurologic:  Normal speech and language.  Global weakness with no gross focal neurologic deficits appreciated.    ED Results / Procedures / Treatments   Labs (all labs ordered are listed, but only abnormal results are displayed) Labs Reviewed  URINALYSIS, ROUTINE W REFLEX MICROSCOPIC - Abnormal; Notable for the following components:      Result Value   Color, Urine YELLOW (*)    APPearance CLEAR (*)    Ketones, ur 80 (*)    All other components within normal limits  BASIC METABOLIC PANEL - Abnormal; Notable for the following components:   Sodium 127 (*)    Chloride 87 (*)    CO2 16 (*)    Anion gap 24 (*)    All other components within normal limits  CBC - Abnormal; Notable for the following components:   WBC 11.0 (*)    All  other components within normal limits  HEPATIC FUNCTION PANEL - Abnormal; Notable for the following components:   Total Bilirubin 2.4 (*)    Bilirubin, Direct 0.6 (*)    Indirect Bilirubin 1.8 (*)    All other components within normal limits  LACTIC ACID, PLASMA - Abnormal; Notable for the following components:   Lactic Acid, Venous 2.9 (*)    All other components within normal limits  RESP PANEL BY RT-PCR (RSV, FLU A&B, COVID)  [id number]  CULTURE, BLOOD (ROUTINE X 2)  CULTURE, BLOOD (ROUTINE X 2)  LACTIC ACID, PLASMA  PROCALCITONIN  ETHANOL  MAGNESIUM     EKG  ED ECG REPORT I, Chesley Noon, the attending physician, personally viewed and interpreted this ECG.   Date: 12/05/2022  EKG Time: 15:19  Rate: 93  Rhythm: normal sinus rhythm  Axis: Normal  Intervals:none  ST&T Change: None  RADIOLOGY Chest x-ray reviewed and interpreted by me with no infiltrate, edema, or  effusion.  PROCEDURES:  Critical Care performed: No  Procedures   MEDICATIONS ORDERED IN ED: Medications  cefTRIAXone (ROCEPHIN) 2 g in sodium chloride 0.9 % 100 mL IVPB (0 g Intravenous Stopped 12/05/22 1843)  sodium chloride 0.9 % bolus 1,000 mL (0 mLs Intravenous Stopped 12/05/22 1806)  ondansetron (ZOFRAN) injection 4 mg (4 mg Intravenous Given 12/05/22 1657)  sodium chloride 0.9 % bolus 1,000 mL (1,000 mLs Intravenous New Bag/Given 12/05/22 1845)     IMPRESSION / MDM / ASSESSMENT AND PLAN / ED COURSE  I reviewed the triage vital signs and the nursing notes.                              67 y.o. male with past medical history of hypertension, stroke, and anemia who presents to the ED with increasing weakness and poor appetite for the past couple of days.  Patient's presentation is most consistent with acute presentation with potential threat to life or bodily function.  Differential diagnosis includes, but is not limited to, sepsis, UTI, pneumonia, anemia, electrolyte abnormality, AKI, alcoholic ketoacidosis.  Patient ill-appearing but in no acute distress, vital signs are remarkable for mild tachycardia but otherwise reassuring.  Patient does appear dehydrated with recent poor oral intake, labs show anion gap acidosis.  This could be due to sepsis and we will initiate workup with blood cultures and lactic acid.  I would also consider alcoholic ketoacidosis given his recent alcohol consumption.  Chest x-ray and additional labs are pending at this time, we will hydrate with IV fluids.  Sepsis workup has been unremarkable, patient did have elevated lactic acid but this normalized with IV fluids and his procalcitonin is undetectable.  Chest x-ray and urinalysis with no evidence of infection, he does have some elevation in indirect bilirubin consistent with alcohol abuse.  He continues to be quite weak and with his alcoholic ketoacidosis, case discussed with hospitalist for admission.       FINAL CLINICAL IMPRESSION(S) / ED DIAGNOSES   Final diagnoses:  Generalized weakness  Alcoholic ketoacidosis     Rx / DC Orders   ED Discharge Orders     None        Note:  This document was prepared using Dragon voice recognition software and may include unintentional dictation errors.   Chesley Noon, MD 12/05/22 [phone]

## 2022-12-05 NOTE — Sepsis Progress Note (Signed)
Sepsis protocol monitored by eLink ?

## 2022-12-05 NOTE — Assessment & Plan Note (Addendum)
-   This could be partly associated with alcoholic ketoacidosis and hyponatremia. - The patient will be admitted to a medical observation bed. - Will obtain.  Consult. - Management otherwise as below.

## 2022-12-05 NOTE — ED Triage Notes (Addendum)
Pt sts that he has been having UTI s/s and feeling weak. Pt wife sts that pt is probably dehydrated since pt has not been eating or drinking for the last few days.

## 2022-12-05 NOTE — Assessment & Plan Note (Signed)
-   The patient will be hydrated with IV normal saline. - Will we will place him on CIWA protocol for the possibility of alcohol withdrawal. - Will follow BMPs.

## 2022-12-05 NOTE — H&P (Signed)
[facility]   PATIENT NAME: Elijah Murphy    MR#:  [id number]  DATE OF BIRTH:  Sep 02, 1955  DATE OF ADMISSION:  12/05/2022  PRIMARY CARE PHYSICIAN: Carren Rang, PA-C   Patient is coming from: Home  REQUESTING/REFERRING PHYSICIAN: Chesley Noon, MD  CHIEF COMPLAINT:   Chief Complaint  Patient presents with   Weakness    HISTORY OF PRESENT ILLNESS:  Elijah Murphy is a 67 y.o. Caucasian male with medical history significant for hypertension and dyslipidemia, who presented to the emergency room with acute onset of generalized weakness with poor appetite over the last couple of days.  He denied any nausea or vomiting or diarrhea.  His only been able to take small amount of liquids and has not had any solid foods for a couple of days.  He had mild nausea with waterbrash.  No diarrhea or melena or bright red being per rectum.  He denied no fever or chills.  No cough or wheezing or dyspnea.  No chest pain or palpitations.  No dysuria, oliguria, hematuria, urinary frequency or urgency or flank pain.  He went back drinking alcohol about a week ago and has been having about 6 ounces of bourbon daily up until today.  ED Course: Upon presentation to the emergency room, BP was 137/95 with otherwise normal vital signs.  Labs revealed magnesium of 1.7 and sodium 127 chloride 87, CO2 16 anion gap 24 LFTs showed indirect bilirubin of 1.8 and direct bili of 0.6.  Lactic acid was 2.9 and later 1.3 and procalcitonin less than 0.1.  CBC showed WBC of 11 and was otherwise unremarkable.  Respiratory panel including COVID-19 PCR, influenza A and B and RSV came back negative.  UA showed 80 ketones and was otherwise unremarkable.  Alcohol level was less than 10.  Blood cultures were drawn. EKG as reviewed by me : EKG showed normal sinus rhythm with rate of 93 with inferior Q waves. Imaging: Two-view chest x-ray showed no acute cardiopulmonary disease.  The patient was given 4 mg of IV Zofran and 2 L  bolus of IV normal saline.  He will be admitted to a medical bed for further evaluation and management. PAST MEDICAL HISTORY:   Past Medical History:  Diagnosis Date   E. coli infection    Hyperlipidemia 08/10/2016   Hypertension     PAST SURGICAL HISTORY:   Past Surgical History:  Procedure Laterality Date   APPENDECTOMY     KNEE SURGERY Left    TONSILLECTOMY      SOCIAL HISTORY:   Social History   Tobacco Use   Smoking status: Never   Smokeless tobacco: Current    Types: Chew  Substance Use Topics   Alcohol use: Yes    Comment: occasionally    FAMILY HISTORY:  His father died from abdominal cancer.  DRUG ALLERGIES:   Allergies  Allergen Reactions   Sulfa Antibiotics Hives    REVIEW OF SYSTEMS:   ROS As per history of present illness. All pertinent systems were reviewed above. Constitutional, HEENT, cardiovascular, respiratory, GI, GU, musculoskeletal, neuro, psychiatric, endocrine, integumentary and hematologic systems were reviewed and are otherwise negative/unremarkable except for positive findings mentioned above in the HPI.   MEDICATIONS AT HOME:   Prior to Admission medications   Medication Sig Start Date End Date Taking? Authorizing Provider  lisinopril (ZESTRIL) 20 MG tablet Take 1 tablet (20 mg total) by mouth daily. 01/31/22   Lurene Shadow, MD  sertraline (ZOLOFT) 25  MG tablet Take 1 tablet (25 mg total) by mouth daily. 01/31/22   Lurene Shadow, MD  silodosin (RAPAFLO) 4 MG CAPS capsule TAKE 1 CAPSULE (4 MG TOTAL) BY MOUTH DAILY WITH BREAKFAST. 11/12/22   Stoioff, Verna Czech, MD      VITAL SIGNS:  Blood pressure (!) 179/105, pulse 95, temperature 98.2 F (36.8 C), temperature source Oral, resp. rate 19, height 5\' 9"  (1.753 m), weight 70.3 kg, SpO2 92%.  PHYSICAL EXAMINATION:  Physical Exam  GENERAL:  67 y.o.-year-old Caucasian male patient lying in the bed with no acute distress.  EYES: Pupils equal, round, reactive to light and  accommodation. No scleral icterus. Extraocular muscles intact.  HEENT: Head atraumatic, normocephalic. Oropharynx and nasopharynx clear.  NECK:  Supple, no jugular venous distention. No thyroid enlargement, no tenderness.  LUNGS: Normal breath sounds bilaterally, no wheezing, rales,rhonchi or crepitation. No use of accessory muscles of respiration.  CARDIOVASCULAR: Regular rate and rhythm, S1, S2 normal. No murmurs, rubs, or gallops.  ABDOMEN: Soft, nondistended, nontender. Bowel sounds present. No organomegaly or mass.  EXTREMITIES: No pedal edema, cyanosis, or clubbing.  NEUROLOGIC: Cranial nerves II through XII are intact. Muscle strength 5/5 in all extremities. Sensation intact. Gait not checked.  PSYCHIATRIC: The patient is alert and oriented x 3.  Normal affect and good eye contact. SKIN: No obvious rash, lesion, or ulcer.   LABORATORY PANEL:   CBC Recent Labs  Lab 12/05/22 1517  WBC 11.0*  HGB 16.4  HCT 46.6  PLT 217   ------------------------------------------------------------------------------------------------------------------  Chemistries  Recent Labs  Lab 12/05/22 1517 12/05/22 1646  NA 127*  --   K 3.5  --   CL 87*  --   CO2 16*  --   GLUCOSE 94  --   BUN 12  --   CREATININE 0.87  --   CALCIUM 9.2  --   MG  --  1.7  AST  --  34  ALT  --  23  ALKPHOS  --  78  BILITOT  --  2.4*   ------------------------------------------------------------------------------------------------------------------  Cardiac Enzymes No results for input(s): "TROPONINI" in the last 168 hours. ------------------------------------------------------------------------------------------------------------------  RADIOLOGY:  DG Chest 2 View  Result Date: 12/05/2022 CLINICAL DATA:  Weakness. EXAM: CHEST - 2 VIEW COMPARISON:  AP chest 01/25/2022 FINDINGS: Cardiac silhouette and mediastinal contours are within normal limits. Mild calcification within the aortic arch. The lungs are  clear. No pleural effusion or pneumothorax. Multilevel anterior bridging osteophytes at the mid to lower thoracic spine. IMPRESSION: No active cardiopulmonary disease. Electronically Signed   By: Neita Garnet M.D.   On: 12/05/2022 17:43      IMPRESSION AND PLAN:  Assessment and Plan: * Generalized weakness - This could be partly associated with alcoholic ketoacidosis and hyponatremia. - The patient will be admitted to a medical observation bed. - Will obtain.  Consult. - Management otherwise as below.  Hyponatremia - This is likely contributing to his generalized weakness. - Will hydrate with IV normal saline and follow BMPs. - Will obtain hyponatremia workup.  Alcoholic ketoacidosis - The patient will be hydrated with IV normal saline. - Will we will place him on CIWA protocol for the possibility of alcohol withdrawal. - Will follow BMPs.   Essential hypertension - We will continue antihypertensive therapy.   DVT prophylaxis: Lovenox.  Advanced Care Planning:  Code Status: full code.  Family Communication:  The plan of care was discussed in details with the patient (and family). I answered all questions. The  patient agreed to proceed with the above mentioned plan. Further management will depend upon hospital course. Disposition Plan: Back to previous home environment Consults called: none.  All the records are reviewed and case discussed with ED provider.  Status is: Observation  I certify that at the time of admission, it is my clinical judgment that the patient will require hospital care extending less than 2 midnights.                            Dispo: The patient is from: Home              Anticipated d/c is to: Home              Patient currently is not medically stable to d/c.              Difficult to place patient: No  Hannah Beat M.D on 12/05/2022 at 8:56 PM  [facility]   From 7 PM-7 AM, contact night-coverage [email or web address]  CC: Primary care  physician; Carren Rang, PA-C

## 2022-12-05 NOTE — Assessment & Plan Note (Signed)
- 

## 2022-12-05 NOTE — Code Documentation (Signed)
CODE SEPSIS - PHARMACY COMMUNICATION  **Broad Spectrum Antibiotics should be administered within 1 hour of Sepsis diagnosis**  Time Code Sepsis Called/Page Received: 1755  Antibiotics Ordered: ceftriaxone  Time of 1st antibiotic administration: 1805  Additional action taken by pharmacy:   If necessary, Name of Provider/Nurse Contacted:     Sharen Hones ,PharmD Clinical Pharmacist  12/05/2022  6:14 PM

## 2022-12-06 DIAGNOSIS — R531 Weakness: Secondary | ICD-10-CM | POA: Diagnosis not present

## 2022-12-06 LAB — CBC
HCT: 37.8 % — ABNORMAL LOW (ref 39.0–52.0)
Hemoglobin: 13.2 g/dL (ref 13.0–17.0)
MCH: 32.5 pg (ref 26.0–34.0)
MCHC: 34.9 g/dL (ref 30.0–36.0)
MCV: 93.1 fL (ref 80.0–100.0)
Platelets: 151 10*3/uL (ref 150–400)
RBC: 4.06 MIL/uL — ABNORMAL LOW (ref 4.22–5.81)
RDW: 13.2 % (ref 11.5–15.5)
WBC: 5 10*3/uL (ref 4.0–10.5)
nRBC: 0 % (ref 0.0–0.2)

## 2022-12-06 LAB — BASIC METABOLIC PANEL
Anion gap: 11 (ref 5–15)
BUN: 10 mg/dL (ref 8–23)
CO2: 21 mmol/L — ABNORMAL LOW (ref 22–32)
Calcium: 8 mg/dL — ABNORMAL LOW (ref 8.9–10.3)
Chloride: 98 mmol/L (ref 98–111)
Creatinine, Ser: 0.9 mg/dL (ref 0.61–1.24)
GFR, Estimated: >60 mL/min (ref >60)
Glucose, Bld: 95 mg/dL (ref 70–99)
Potassium: 4.3 mmol/L (ref 3.5–5.1)
Sodium: 130 mmol/L — ABNORMAL LOW (ref 135–145)

## 2022-12-06 MED ORDER — MIRTAZAPINE 15 MG PO TBDP: 15.0000 mg | ORAL_TABLET | Freq: Every day | ORAL | 0 refills | Status: DC

## 2022-12-06 MED ORDER — ONDANSETRON 4 MG PO TBDP: 4.0000 mg | ORAL_TABLET | Freq: Three times a day (TID) | ORAL | 0 refills | Status: DC | PRN

## 2022-12-06 NOTE — Discharge Summary (Signed)
Physician Discharge Summary  Elijah Murphy WGN:[id number] DOB: Apr 12, 1955 DOA: 12/05/2022  PCP: Carren Rang, PA-C  Admit date: 12/05/2022 Discharge date: 12/06/2022  Admitted From: Home Disposition:  Home  Recommendations for Outpatient Follow-up:  Follow up with PCP in 1-2 weeks   Home Health:No Equipment/Devices:None   Discharge Condition:Stable  CODE STATUS:FULL  Diet recommendation: Reg  Brief/Interim Summary:  67 y.o. Caucasian male with medical history significant for hypertension and dyslipidemia, who presented to the emergency room with acute onset of generalized weakness with poor appetite over the last couple of days.  He denied any nausea or vomiting or diarrhea.  His only been able to take small amount of liquids and has not had any solid foods for a couple of days.  He had mild nausea with waterbrash.  No diarrhea or melena or bright red being per rectum.  He denied no fever or chills.  No cough or wheezing or dyspnea.  No chest pain or palpitations.  No dysuria, oliguria, hematuria, urinary frequency or urgency or flank pain.  He went back drinking alcohol about a week ago and has been having about 6 ounces of bourbon daily up until today.   Placed on aggressive intravenous fluids.  Metabolic acidosis and electrolytes have corrected.  Medically stable at time of discharge.  Lengthy conversation with patient and spouse at bedside.  Offered PT and OT evaluations and TOC consult for substance abuse resources.  Patient and spouse have declined stating they are primary care can arrange for home health services and provide resources for substance abuse.  Alcohol cessation advised.  All questions answered.    Discharge Diagnoses:  Principal Problem:   Generalized weakness Active Problems:   Alcoholic ketoacidosis   Hyponatremia   Essential hypertension  I suspect patient's laboratory derangements noted on admission are secondary to poor nutritional status and recurrent  alcohol use.  Lengthy discussion with patient and spouse at bedside.  Patient had previously been abstaining from alcohol however had relapsed and drinking approximately 6 ounces of bourbon a day.  Oral intake has been poor.  Patient's metabolic parameters have improved.  I offered consultations with PT OT and TOC however patient and spouse declined stating that they will follow-up with her primary care physician for further guidance.  Discharge Instructions  Discharge Instructions     Diet - low sodium heart healthy   Complete by: As directed    Increase activity slowly   Complete by: As directed       Allergies as of 12/06/2022       Reactions   Sulfa Antibiotics Hives        Medication List     TAKE these medications    gabapentin 100 MG capsule Commonly known as: NEURONTIN Take 200 mg by mouth 3 (three) times daily as needed.   lisinopril 20 MG tablet Commonly known as: ZESTRIL Take 1 tablet (20 mg total) by mouth daily.   mirtazapine 15 MG disintegrating tablet Commonly known as: REMERON SOL-TAB Take 1 tablet (15 mg total) by mouth at bedtime.   ondansetron 4 MG disintegrating tablet Commonly known as: ZOFRAN-ODT Take 1 tablet (4 mg total) by mouth every 8 (eight) hours as needed for nausea or vomiting.   rosuvastatin 20 MG tablet Commonly known as: CRESTOR Take 20 mg by mouth daily.   sertraline 25 MG tablet Commonly known as: ZOLOFT Take 1 tablet (25 mg total) by mouth daily.   silodosin 4 MG Caps capsule Commonly known as: RAPAFLO TAKE  1 CAPSULE (4 MG TOTAL) BY MOUTH DAILY WITH BREAKFAST.   Vitamin D (Ergocalciferol) 1.25 MG (50000 UNIT) Caps capsule Commonly known as: DRISDOL Take 50,000 Units by mouth once a week.        Allergies  Allergen Reactions   Sulfa Antibiotics Hives    Consultations: None   Procedures/Studies: DG Chest 2 View  Result Date: 12/05/2022 CLINICAL DATA:  Weakness. EXAM: CHEST - 2 VIEW COMPARISON:  AP chest  01/25/2022 FINDINGS: Cardiac silhouette and mediastinal contours are within normal limits. Mild calcification within the aortic arch. The lungs are clear. No pleural effusion or pneumothorax. Multilevel anterior bridging osteophytes at the mid to lower thoracic spine. IMPRESSION: No active cardiopulmonary disease. Electronically Signed   By: Neita Garnet M.D.   On: 12/05/2022 17:43      Subjective: Seen and examined on the day of discharge.  Stable no distress.  Appropriate for discharge home.  Discharge Exam: Vitals:   12/06/22 0805 12/06/22 0909  BP: (!) 148/86 (!) 143/90  Pulse: 86 81  Resp:  (!) 26  Temp:  98.1 F (36.7 C)  SpO2:  96%   Vitals:   12/06/22 0545 12/06/22 0613 12/06/22 0805 12/06/22 0909  BP:   (!) 148/86 (!) 143/90  Pulse: 78  86 81  Resp: 16   (!) 26  Temp:  98.9 F (37.2 C)  98.1 F (36.7 C)  TempSrc:  Oral  Oral  SpO2:    96%  Weight:      Height:        General: Pt is alert, awake, not in acute distress Cardiovascular: RRR, S1/S2 +, no rubs, no gallops Respiratory: CTA bilaterally, no wheezing, no rhonchi Abdominal: Soft, NT, ND, bowel sounds + Extremities: no edema, no cyanosis    The results of significant diagnostics from this hospitalization (including imaging, microbiology, ancillary and laboratory) are listed below for reference.     Microbiology: Recent Results (from the past 240 hour(s))  Culture, blood (routine x 2)     Status: None (Preliminary result)   Collection Time: 12/05/22  4:46 PM   Specimen: BLOOD  Result Value Ref Range Status   Specimen Description BLOOD BLOOD LEFT ARM  Final   Special Requests   Final    BOTTLES DRAWN AEROBIC AND ANAEROBIC Blood Culture adequate volume   Culture   Final    NO GROWTH < 24 HOURS Performed at [facility], [address], [place], [place] [address]    Report Status PENDING  Incomplete  Culture, blood (routine x 2)     Status: None (Preliminary result)   Collection Time:  12/05/22  4:46 PM   Specimen: BLOOD  Result Value Ref Range Status   Specimen Description BLOOD BLOOD RIGHT ARM  Final   Special Requests   Final    BOTTLES DRAWN AEROBIC AND ANAEROBIC Blood Culture adequate volume   Culture   Final    NO GROWTH < 24 HOURS Performed at [facility], [address], [place], [place] [address]    Report Status PENDING  Incomplete  Resp panel by RT-PCR (RSV, Flu A&B, Covid) Anterior Nasal Swab     Status: None   Collection Time: 12/05/22  4:46 PM   Specimen: Anterior Nasal Swab  Result Value Ref Range Status   SARS Coronavirus 2 by RT PCR NEGATIVE NEGATIVE Final    Comment: (NOTE) SARS-CoV-2 target nucleic acids are NOT DETECTED.  The SARS-CoV-2 RNA is generally detectable in upper respiratory specimens  during the acute phase of infection. The lowest concentration of SARS-CoV-2 viral copies this assay can detect is 138 copies/mL. A negative result does not preclude SARS-Cov-2 infection and should not be used as the sole basis for treatment or other patient management decisions. A negative result may occur with  improper specimen collection/handling, submission of specimen other than nasopharyngeal swab, presence of viral mutation(s) within the areas targeted by this assay, and inadequate number of viral copies(<138 copies/mL). A negative result must be combined with clinical observations, patient history, and epidemiological information. The expected result is Negative.  Fact Sheet for Patients:  BloggerCourse.com  Fact Sheet for Healthcare Providers:  SeriousBroker.it  This test is no t yet approved or cleared by the [place] FDA and  has been authorized for detection and/or diagnosis of SARS-CoV-2 by FDA under an Emergency Use Authorization (EUA). This EUA will remain  in effect (meaning this test can be used) for the duration of the COVID-19 declaration under Section 564(b)(1)  of the Act, 21 U.S.C.section 360bbb-3(b)(1), unless the authorization is terminated  or revoked sooner.       Influenza A by PCR NEGATIVE NEGATIVE Final   Influenza B by PCR NEGATIVE NEGATIVE Final    Comment: (NOTE) The Xpert Xpress SARS-CoV-2/FLU/RSV plus assay is intended as an aid in the diagnosis of influenza from Nasopharyngeal swab specimens and should not be used as a sole basis for treatment. Nasal washings and aspirates are unacceptable for Xpert Xpress SARS-CoV-2/FLU/RSV testing.  Fact Sheet for Patients: BloggerCourse.com  Fact Sheet for Healthcare Providers: SeriousBroker.it  This test is not yet approved or cleared by the [place] FDA and has been authorized for detection and/or diagnosis of SARS-CoV-2 by FDA under an Emergency Use Authorization (EUA). This EUA will remain in effect (meaning this test can be used) for the duration of the COVID-19 declaration under Section 564(b)(1) of the Act, 21 U.S.C. section 360bbb-3(b)(1), unless the authorization is terminated or revoked.     Resp Syncytial Virus by PCR NEGATIVE NEGATIVE Final    Comment: (NOTE) Fact Sheet for Patients: BloggerCourse.com  Fact Sheet for Healthcare Providers: SeriousBroker.it  This test is not yet approved or cleared by the [place] FDA and has been authorized for detection and/or diagnosis of SARS-CoV-2 by FDA under an Emergency Use Authorization (EUA). This EUA will remain in effect (meaning this test can be used) for the duration of the COVID-19 declaration under Section 564(b)(1) of the Act, 21 U.S.C. section 360bbb-3(b)(1), unless the authorization is terminated or revoked.  Performed at [facility], [address] [place], [place] [address]      Labs: BNP (last 3 results) No results for input(s): "BNP" in the last 8760 hours. Basic Metabolic  Panel: Recent Labs  Lab 12/05/22 1517 12/05/22 1646 12/06/22 0609  NA 127*  --  130*  K 3.5  --  4.3  CL 87*  --  98  CO2 16*  --  21*  GLUCOSE 94  --  95  BUN 12  --  10  CREATININE 0.87  --  0.90  CALCIUM 9.2  --  8.0*  MG  --  1.7  --    Liver Function Tests: Recent Labs  Lab 12/05/22 1646  AST 34  ALT 23  ALKPHOS 78  BILITOT 2.4*  PROT 8.1  ALBUMIN 4.8   No results for input(s): "LIPASE", "AMYLASE" in the last 168 hours. No results for input(s): "AMMONIA" in the last 168 hours. CBC: Recent Labs  Lab 12/05/22 1517 12/06/22 0609  WBC 11.0* 5.0  HGB 16.4 13.2  HCT 46.6 37.8*  MCV 91.4 93.1  PLT 217 151   Cardiac Enzymes: No results for input(s): "CKTOTAL", "CKMB", "CKMBINDEX", "TROPONINI" in the last 168 hours. BNP: Invalid input(s): "POCBNP" CBG: No results for input(s): "GLUCAP" in the last 168 hours. D-Dimer No results for input(s): "DDIMER" in the last 72 hours. Hgb A1c No results for input(s): "HGBA1C" in the last 72 hours. Lipid Profile No results for input(s): "CHOL", "HDL", "LDLCALC", "TRIG", "CHOLHDL", "LDLDIRECT" in the last 72 hours. Thyroid function studies No results for input(s): "TSH", "T4TOTAL", "T3FREE", "THYROIDAB" in the last 72 hours.  Invalid input(s): "FREET3" Anemia work up No results for input(s): "VITAMINB12", "FOLATE", "FERRITIN", "TIBC", "IRON", "RETICCTPCT" in the last 72 hours. Urinalysis    Component Value Date/Time   COLORURINE YELLOW (A) 12/05/2022 1517   APPEARANCEUR CLEAR (A) 12/05/2022 1517   LABSPEC 1.012 12/05/2022 1517   PHURINE 6.0 12/05/2022 1517   GLUCOSEU NEGATIVE 12/05/2022 1517   HGBUR NEGATIVE 12/05/2022 1517   BILIRUBINUR NEGATIVE 12/05/2022 1517   KETONESUR 80 (A) 12/05/2022 1517   PROTEINUR NEGATIVE 12/05/2022 1517   NITRITE NEGATIVE 12/05/2022 1517   LEUKOCYTESUR NEGATIVE 12/05/2022 1517   Sepsis Labs Recent Labs  Lab 12/05/22 1517 12/06/22 0609  WBC 11.0* 5.0   Microbiology Recent  Results (from the past 240 hour(s))  Culture, blood (routine x 2)     Status: None (Preliminary result)   Collection Time: 12/05/22  4:46 PM   Specimen: BLOOD  Result Value Ref Range Status   Specimen Description BLOOD BLOOD LEFT ARM  Final   Special Requests   Final    BOTTLES DRAWN AEROBIC AND ANAEROBIC Blood Culture adequate volume   Culture   Final    NO GROWTH < 24 HOURS Performed at [facility], [address], [place], [place] [address]    Report Status PENDING  Incomplete  Culture, blood (routine x 2)     Status: None (Preliminary result)   Collection Time: 12/05/22  4:46 PM   Specimen: BLOOD  Result Value Ref Range Status   Specimen Description BLOOD BLOOD RIGHT ARM  Final   Special Requests   Final    BOTTLES DRAWN AEROBIC AND ANAEROBIC Blood Culture adequate volume   Culture   Final    NO GROWTH < 24 HOURS Performed at [facility], [address] [place], [place] [address]    Report Status PENDING  Incomplete  Resp panel by RT-PCR (RSV, Flu A&B, Covid) Anterior Nasal Swab     Status: None   Collection Time: 12/05/22  4:46 PM   Specimen: Anterior Nasal Swab  Result Value Ref Range Status   SARS Coronavirus 2 by RT PCR NEGATIVE NEGATIVE Final    Comment: (NOTE) SARS-CoV-2 target nucleic acids are NOT DETECTED.  The SARS-CoV-2 RNA is generally detectable in upper respiratory specimens during the acute phase of infection. The lowest concentration of SARS-CoV-2 viral copies this assay can detect is 138 copies/mL. A negative result does not preclude SARS-Cov-2 infection and should not be used as the sole basis for treatment or other patient management decisions. A negative result may occur with  improper specimen collection/handling, submission of specimen other than nasopharyngeal swab, presence of viral mutation(s) within the areas targeted by this assay, and inadequate number of viral copies(<138 copies/mL). A negative result must be combined  with clinical observations, patient history, and epidemiological information. The expected result is Negative.  Fact Sheet for Patients:  BloggerCourse.com  Fact Sheet for Healthcare Providers:  SeriousBroker.it  This test is no t yet approved or cleared by the [place] FDA and  has been authorized for detection and/or diagnosis of SARS-CoV-2 by FDA under an Emergency Use Authorization (EUA). This EUA will remain  in effect (meaning this test can be used) for the duration of the COVID-19 declaration under Section 564(b)(1) of the Act, 21 U.S.C.section 360bbb-3(b)(1), unless the authorization is terminated  or revoked sooner.       Influenza A by PCR NEGATIVE NEGATIVE Final   Influenza B by PCR NEGATIVE NEGATIVE Final    Comment: (NOTE) The Xpert Xpress SARS-CoV-2/FLU/RSV plus assay is intended as an aid in the diagnosis of influenza from Nasopharyngeal swab specimens and should not be used as a sole basis for treatment. Nasal washings and aspirates are unacceptable for Xpert Xpress SARS-CoV-2/FLU/RSV testing.  Fact Sheet for Patients: BloggerCourse.com  Fact Sheet for Healthcare Providers: SeriousBroker.it  This test is not yet approved or cleared by the [place] FDA and has been authorized for detection and/or diagnosis of SARS-CoV-2 by FDA under an Emergency Use Authorization (EUA). This EUA will remain in effect (meaning this test can be used) for the duration of the COVID-19 declaration under Section 564(b)(1) of the Act, 21 U.S.C. section 360bbb-3(b)(1), unless the authorization is terminated or revoked.     Resp Syncytial Virus by PCR NEGATIVE NEGATIVE Final    Comment: (NOTE) Fact Sheet for Patients: BloggerCourse.com  Fact Sheet for Healthcare Providers: SeriousBroker.it  This test is not yet approved  or cleared by the [place] FDA and has been authorized for detection and/or diagnosis of SARS-CoV-2 by FDA under an Emergency Use Authorization (EUA). This EUA will remain in effect (meaning this test can be used) for the duration of the COVID-19 declaration under Section 564(b)(1) of the Act, 21 U.S.C. section 360bbb-3(b)(1), unless the authorization is terminated or revoked.  Performed at [facility], [address], [place], [place] [address]      Time coordinating discharge: Over 30 minutes  SIGNED:   Tresa Moore, MD  [facility] 12/06/2022, 1:56 PM Pager   If 7PM-7AM, please contact night-coverage

## 2022-12-10 LAB — CULTURE, BLOOD (ROUTINE X 2)
Culture: NO GROWTH
Culture: NO GROWTH
Special Requests: ADEQUATE
Special Requests: ADEQUATE

## 2023-03-12 ENCOUNTER — Inpatient Hospital Stay: Payer: Medicare HMO

## 2023-03-12 ENCOUNTER — Emergency Department: Payer: Medicare HMO

## 2023-03-12 ENCOUNTER — Inpatient Hospital Stay (HOSPITAL_COMMUNITY)
Admit: 2023-03-12 | Discharge: 2023-03-12 | Disposition: A | Discharge status: Home / Self Care | Payer: Medicare HMO | Attending: Family Medicine | Admitting: Family Medicine

## 2023-03-12 ENCOUNTER — Inpatient Hospital Stay
Admission: EM | Admit: 2023-03-12 | Discharge: 2023-03-17 | DRG: 065 | Disposition: A | Discharge status: Hospice | Payer: Medicare HMO | Attending: Internal Medicine | Admitting: Internal Medicine

## 2023-03-12 ENCOUNTER — Other Ambulatory Visit: Payer: Self-pay

## 2023-03-12 DIAGNOSIS — Z6826 Body mass index (BMI) 26.0-26.9, adult: Secondary | ICD-10-CM

## 2023-03-12 DIAGNOSIS — I639 Cerebral infarction, unspecified: Principal | ICD-10-CM | POA: Diagnosis present

## 2023-03-12 DIAGNOSIS — D649 Anemia, unspecified: Secondary | ICD-10-CM | POA: Diagnosis present

## 2023-03-12 DIAGNOSIS — Z8673 Personal history of transient ischemic attack (TIA), and cerebral infarction without residual deficits: Secondary | ICD-10-CM

## 2023-03-12 DIAGNOSIS — Z66 Do not resuscitate: Secondary | ICD-10-CM | POA: Diagnosis present

## 2023-03-12 DIAGNOSIS — I10 Essential (primary) hypertension: Secondary | ICD-10-CM

## 2023-03-12 DIAGNOSIS — E876 Hypokalemia: Secondary | ICD-10-CM | POA: Diagnosis present

## 2023-03-12 DIAGNOSIS — R0989 Other specified symptoms and signs involving the circulatory and respiratory systems: Secondary | ICD-10-CM | POA: Diagnosis not present

## 2023-03-12 DIAGNOSIS — Z7189 Other specified counseling: Secondary | ICD-10-CM | POA: Diagnosis not present

## 2023-03-12 DIAGNOSIS — R531 Weakness: Secondary | ICD-10-CM | POA: Diagnosis present

## 2023-03-12 DIAGNOSIS — H538 Other visual disturbances: Secondary | ICD-10-CM | POA: Diagnosis present

## 2023-03-12 DIAGNOSIS — R1310 Dysphagia, unspecified: Secondary | ICD-10-CM | POA: Diagnosis present

## 2023-03-12 DIAGNOSIS — E440 Moderate protein-calorie malnutrition: Secondary | ICD-10-CM | POA: Insufficient documentation

## 2023-03-12 DIAGNOSIS — Z882 Allergy status to sulfonamides status: Secondary | ICD-10-CM

## 2023-03-12 DIAGNOSIS — E8721 Acute metabolic acidosis: Secondary | ICD-10-CM | POA: Diagnosis present

## 2023-03-12 DIAGNOSIS — H5461 Unqualified visual loss, right eye, normal vision left eye: Secondary | ICD-10-CM | POA: Diagnosis present

## 2023-03-12 DIAGNOSIS — F1010 Alcohol abuse, uncomplicated: Secondary | ICD-10-CM | POA: Diagnosis present

## 2023-03-12 DIAGNOSIS — Z7982 Long term (current) use of aspirin: Secondary | ICD-10-CM

## 2023-03-12 DIAGNOSIS — Z515 Encounter for palliative care: Secondary | ICD-10-CM

## 2023-03-12 DIAGNOSIS — R2681 Unsteadiness on feet: Secondary | ICD-10-CM | POA: Diagnosis present

## 2023-03-12 DIAGNOSIS — E785 Hyperlipidemia, unspecified: Secondary | ICD-10-CM | POA: Diagnosis present

## 2023-03-12 DIAGNOSIS — G8194 Hemiplegia, unspecified affecting left nondominant side: Secondary | ICD-10-CM | POA: Diagnosis present

## 2023-03-12 DIAGNOSIS — D696 Thrombocytopenia, unspecified: Secondary | ICD-10-CM | POA: Diagnosis present

## 2023-03-12 DIAGNOSIS — R4781 Slurred speech: Secondary | ICD-10-CM | POA: Diagnosis present

## 2023-03-12 DIAGNOSIS — G621 Alcoholic polyneuropathy: Secondary | ICD-10-CM | POA: Diagnosis present

## 2023-03-12 DIAGNOSIS — Z79899 Other long term (current) drug therapy: Secondary | ICD-10-CM

## 2023-03-12 DIAGNOSIS — K7682 Hepatic encephalopathy: Secondary | ICD-10-CM | POA: Diagnosis present

## 2023-03-12 DIAGNOSIS — K567 Ileus, unspecified: Secondary | ICD-10-CM

## 2023-03-12 DIAGNOSIS — H5440 Blindness, one eye, unspecified eye: Secondary | ICD-10-CM

## 2023-03-12 LAB — BASIC METABOLIC PANEL
Anion gap: 14 (ref 5–15)
BUN: 13 mg/dL (ref 8–23)
CO2: 21 mmol/L — ABNORMAL LOW (ref 22–32)
Calcium: 8.7 mg/dL — ABNORMAL LOW (ref 8.9–10.3)
Chloride: 103 mmol/L (ref 98–111)
Creatinine, Ser: 0.99 mg/dL (ref 0.61–1.24)
GFR, Estimated: >60 mL/min (ref >60)
Glucose, Bld: 143 mg/dL — ABNORMAL HIGH (ref 70–99)
Potassium: 4.7 mmol/L (ref 3.5–5.1)
Sodium: 138 mmol/L (ref 135–145)

## 2023-03-12 LAB — ECHOCARDIOGRAM COMPLETE
AR max vel: 4.32 cm2
AV Area VTI: 4.99 cm2
AV Area mean vel: 3.68 cm2
AV Mean grad: 2 mm[Hg]
AV Peak grad: 3.2 mm[Hg]
Ao pk vel: 0.89 m/s
Area-P 1/2: 4.54 cm2
Height: 69 in
S' Lateral: 3.4 cm
Weight: 2592 [oz_av]

## 2023-03-12 LAB — CBC
HCT: 43.3 % (ref 39.0–52.0)
Hemoglobin: 14.8 g/dL (ref 13.0–17.0)
MCH: 34.3 pg — ABNORMAL HIGH (ref 26.0–34.0)
MCHC: 34.2 g/dL (ref 30.0–36.0)
MCV: 100.5 fL — ABNORMAL HIGH (ref 80.0–100.0)
Platelets: 149 10*3/uL — ABNORMAL LOW (ref 150–400)
RBC: 4.31 MIL/uL (ref 4.22–5.81)
RDW: 13.5 % (ref 11.5–15.5)
WBC: 5.6 10*3/uL (ref 4.0–10.5)
nRBC: 0 % (ref 0.0–0.2)

## 2023-03-12 LAB — HIV ANTIBODY (ROUTINE TESTING W REFLEX): HIV Screen 4th Generation wRfx: NONREACTIVE

## 2023-03-12 LAB — HEPATIC FUNCTION PANEL
ALT: 30 U/L (ref 0–44)
AST: 35 U/L (ref 15–41)
Albumin: 4.3 g/dL (ref 3.5–5.0)
Alkaline Phosphatase: 47 U/L (ref 38–126)
Bilirubin, Direct: 0.4 mg/dL — ABNORMAL HIGH (ref 0.0–0.2)
Indirect Bilirubin: 1.1 mg/dL — ABNORMAL HIGH (ref 0.3–0.9)
Total Bilirubin: 1.5 mg/dL — ABNORMAL HIGH (ref 0.0–1.2)
Total Protein: 7.1 g/dL (ref 6.5–8.1)

## 2023-03-12 LAB — PROTIME-INR
INR: 1.1 (ref 0.8–1.2)
Prothrombin Time: 14.5 s (ref 11.4–15.2)

## 2023-03-12 LAB — HEMOGLOBIN A1C
Hgb A1c MFr Bld: 5 % (ref 4.8–5.6)
Mean Plasma Glucose: 97 mg/dL

## 2023-03-12 LAB — ETHANOL: Alcohol, Ethyl (B): <10 mg/dL (ref <10)

## 2023-03-12 LAB — AMMONIA: Ammonia: 12 umol/L (ref 9–35)

## 2023-03-12 LAB — MAGNESIUM: Magnesium: 1.7 mg/dL (ref 1.7–2.4)

## 2023-03-12 LAB — APTT: aPTT: 27 s (ref 24–36)

## 2023-03-12 MED ORDER — ENOXAPARIN SODIUM 40 MG/0.4ML IJ SOSY
40.0000 mg | PREFILLED_SYRINGE | INTRAMUSCULAR | Status: DC
  Administered 2023-03-12 – 2023-03-16 (×5): 40 mg via SUBCUTANEOUS
  Filled 2023-03-12 (×5): qty 0.4

## 2023-03-12 MED ORDER — THIAMINE HCL 100 MG/ML IJ SOLN
500.0000 mg | Freq: Once | INTRAVENOUS | Status: AC
  Administered 2023-03-12: 500 mg via INTRAVENOUS
  Filled 2023-03-12: qty 4

## 2023-03-12 MED ORDER — SODIUM CHLORIDE 0.9 % IV BOLUS
1000.0000 mL | Freq: Once | INTRAVENOUS | Status: AC
  Administered 2023-03-12: 1000 mL via INTRAVENOUS

## 2023-03-12 MED ORDER — FOLIC ACID 1 MG PO TABS
1.0000 mg | ORAL_TABLET | Freq: Every day | ORAL | Status: DC
  Administered 2023-03-13 – 2023-03-16 (×3): 1 mg via ORAL
  Filled 2023-03-12 (×5): qty 1

## 2023-03-12 MED ORDER — ACETAMINOPHEN 325 MG PO TABS
650.0000 mg | ORAL_TABLET | ORAL | Status: DC | PRN
  Filled 2023-03-12: qty 2

## 2023-03-12 MED ORDER — THIAMINE MONONITRATE 100 MG PO TABS
100.0000 mg | ORAL_TABLET | Freq: Every day | ORAL | Status: DC
  Administered 2023-03-14 – 2023-03-16 (×2): 100 mg via ORAL
  Filled 2023-03-12 (×3): qty 1

## 2023-03-12 MED ORDER — SENNOSIDES-DOCUSATE SODIUM 8.6-50 MG PO TABS: 1.0000 | ORAL_TABLET | Freq: Every evening | ORAL | Status: DC | PRN

## 2023-03-12 MED ORDER — SODIUM CHLORIDE 0.9 % IV SOLN
12.5000 mg | Freq: Four times a day (QID) | INTRAVENOUS | Status: DC | PRN
  Administered 2023-03-12 – 2023-03-14 (×3): 12.5 mg via INTRAVENOUS
  Filled 2023-03-12 (×2): qty 0.5
  Filled 2023-03-12 (×2): qty 12.5

## 2023-03-12 MED ORDER — SODIUM CHLORIDE 0.9 % IV SOLN: INTRAVENOUS | Status: AC

## 2023-03-12 MED ORDER — LORAZEPAM 1 MG PO TABS: 1.0000 mg | ORAL_TABLET | ORAL | Status: AC | PRN

## 2023-03-12 MED ORDER — LORAZEPAM 2 MG/ML IJ SOLN
1.0000 mg | INTRAMUSCULAR | Status: AC | PRN
  Administered 2023-03-14: 1 mg via INTRAVENOUS
  Administered 2023-03-14: 2 mg via INTRAVENOUS
  Filled 2023-03-12 (×2): qty 1

## 2023-03-12 MED ORDER — STROKE: EARLY STAGES OF RECOVERY BOOK: Freq: Once | Status: AC

## 2023-03-12 MED ORDER — ACETAMINOPHEN 160 MG/5ML PO SOLN: 650.0000 mg | ORAL | Status: DC | PRN

## 2023-03-12 MED ORDER — FOLIC ACID 5 MG/ML IJ SOLN
1.0000 mg | Freq: Once | INTRAMUSCULAR | Status: AC
  Administered 2023-03-12: 1 mg via INTRAVENOUS
  Filled 2023-03-12: qty 0.2

## 2023-03-12 MED ORDER — ONDANSETRON HCL 4 MG/2ML IJ SOLN
4.0000 mg | Freq: Four times a day (QID) | INTRAMUSCULAR | Status: DC | PRN
  Administered 2023-03-12 – 2023-03-17 (×9): 4 mg via INTRAVENOUS
  Filled 2023-03-12 (×9): qty 2

## 2023-03-12 MED ORDER — THIAMINE HCL 100 MG/ML IJ SOLN
100.0000 mg | Freq: Every day | INTRAMUSCULAR | Status: DC
  Administered 2023-03-13 – 2023-03-15 (×2): 100 mg via INTRAVENOUS
  Filled 2023-03-12 (×2): qty 2

## 2023-03-12 MED ORDER — IOHEXOL 350 MG/ML SOLN
75.0000 mL | Freq: Once | INTRAVENOUS | Status: AC | PRN
  Administered 2023-03-12: 75 mL via INTRAVENOUS

## 2023-03-12 MED ORDER — LABETALOL HCL 5 MG/ML IV SOLN
10.0000 mg | INTRAVENOUS | Status: DC | PRN
  Administered 2023-03-12 – 2023-03-14 (×4): 10 mg via INTRAVENOUS
  Filled 2023-03-12 (×4): qty 4

## 2023-03-12 MED ORDER — ACETAMINOPHEN 650 MG RE SUPP
650.0000 mg | RECTAL | Status: DC | PRN
  Administered 2023-03-12: 650 mg via RECTAL
  Filled 2023-03-12: qty 1

## 2023-03-12 MED ORDER — ASPIRIN 81 MG PO TBEC
81.0000 mg | DELAYED_RELEASE_TABLET | Freq: Every day | ORAL | Status: DC
Start: 2023-03-13 — End: 2023-03-13
  Filled 2023-03-12: qty 1

## 2023-03-12 MED ORDER — ADULT MULTIVITAMIN W/MINERALS CH
1.0000 | ORAL_TABLET | Freq: Every day | ORAL | Status: DC
  Administered 2023-03-13 – 2023-03-16 (×3): 1 via ORAL
  Filled 2023-03-12 (×5): qty 1

## 2023-03-12 NOTE — Assessment & Plan Note (Addendum)
 New onset left-sided blurry vision x 1 to 2 days with left-sided weakness and hemiparesis since 2 AM Baseline alcohol  abuse and chronic hepatic encephalopathy is a confounding issue WKS encephalopathy  is also on differential diagnosis CT head within normal limits Will plan for formal stroke evaluation including MRI of the brain, CTA of the head neck, 2D echo, risk stratification labs Start thiamine , folate, multivitamin supplementation  Ammonia level x 1 Baby asa  Monitor  Neurology consultation as indicated

## 2023-03-12 NOTE — Assessment & Plan Note (Signed)
 Poorly controlled at baseline Blood pressure 190s to 220s over 90s to 110s Allow for permissive hypertension in the setting of CVA evaluation As needed IV labetalol for pressure greater than 220 or diastolic pressure greater than 110 Monitor

## 2023-03-12 NOTE — Assessment & Plan Note (Addendum)
 Patient regularly drinking at least 6 ounces of bourbon daily Longstanding issue Will place on CIWA protocol with vitamin supplementation Patient noted to be minimally ambulatory-there is a strong enabling portion of this as wife is bringing alcohol  to the patient. Discussed importance of cessation with patient and wife at the bedside Monitor

## 2023-03-12 NOTE — ED Notes (Signed)
 Assisted Pt to side of bed to get urine sample. Pt was unable to stand up without assistance x2 on both sides. Pt was leaning to L side when standing. GCS 15, MRI @bedside  to take Pt. Pt was unable to give UA, will try again later

## 2023-03-12 NOTE — Progress Notes (Signed)
 PT Cancellation Note  Patient Details Name: Elijah Murphy MRN: [id number] DOB: 1955-08-29   Cancelled Treatment:    Reason Eval/Treat Not Completed: Other (comment): Per MD hold PT eval until 03/13/23.  Will attempt to see pt that date as medically appropriate.     CHARM [name] PT, DPT 03/12/23, 3:18 PM

## 2023-03-12 NOTE — Progress Notes (Signed)
*  PRELIMINARY RESULTS* Echocardiogram 2D Echocardiogram has been performed.  Elijah Murphy 03/12/2023, 1:44 PM

## 2023-03-12 NOTE — ED Notes (Signed)
 Pt had another bout of nausea, PRN phenergan given

## 2023-03-12 NOTE — ED Notes (Signed)
 Pt failed swallow screen. Wife at bedside reports pt has been having problems swallowing and is supposed to have esophageal procedure

## 2023-03-12 NOTE — ED Provider Notes (Signed)
 [facility] Provider Note    Event Date/Time   First MD Initiated Contact with Patient 03/12/23 [phone]     (approximate)   History   Chief Complaint Weakness   HPI  Elijah Murphy is a 68 y.o. male with past medical history of hypertension, hyperlipidemia, and stroke who presents to the ED complaining of weakness.  Wife states that patient for started complaining of blurriness in his left eye 2 days ago, but refused to have this evaluated at his ophthalmologist.  After getting up this morning, she then noticed that he was having difficulty walking and falling to the left side, typically gets around with a walker.  She also notes that his speech has seemed more slurred than usual, but she has not noticed any confusion.  Patient continues to drink at least a few ounces of bourbon on a daily basis, has previously been admitted for alcoholic ketoacidosis.  Wife does state that he was eating well yesterday, but patient had some nausea and vomiting with EMS.  Patient denies any abdominal pain, dysuria, fever, cough, chest pain, shortness of breath.  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     Physical Exam   Triage Vital Signs: ED Triage Vitals  Encounter Vitals Group     BP 03/12/23 0900 (!) 220/118     Systolic BP Percentile --      Diastolic BP Percentile --      Pulse Rate 03/12/23 0900 83     Resp 03/12/23 0900 16     Temp 03/12/23 0904 97.6 F (36.4 C)     Temp src --      SpO2 03/12/23 0900 95 %     Weight 03/12/23 0902 162 lb (73.5 kg)     Height 03/12/23 0902 5' 9 (1.753 m)     Head Circumference --      Peak Flow --      Pain Score 03/12/23 0901 5     Pain Loc --      Pain Education --      Exclude from Growth Chart --     Most recent vital signs: Vitals:   03/12/23 0930 03/12/23 1000  BP: (!) 198/104 (!) 198/96  Pulse: 72 60  Resp: 13 10  Temp:     SpO2: 99% 99%    Constitutional: Alert and oriented. Eyes: Conjunctivae are normal. Head: Atraumatic. Nose: No congestion/rhinnorhea. Mouth/Throat: Mucous membranes are moist.  Cardiovascular: Normal rate, regular rhythm. Grossly normal heart sounds.  2+ radial pulses bilaterally. Respiratory: Normal respiratory effort.  No retractions. Lungs CTAB. Gastrointestinal: Soft and nontender. No distention. Musculoskeletal: No lower extremity tenderness nor edema.  Neurologic: Slurred speech with subtle left-sided facial droop.  5 out of 5 strength in bilateral upper and lower extremities.    ED Results / Procedures / Treatments   Labs (all labs ordered are listed, but only abnormal results are displayed) Labs Reviewed  CBC - Abnormal; Notable for the following components:      Result Value   MCV 100.5 (*)    MCH 34.3 (*)    Platelets 149 (*)    All other components within normal limits  BASIC METABOLIC PANEL - Abnormal; Notable for the following components:   CO2 21 (*)    Glucose, Bld 143 (*)    Calcium 8.7 (*)    All other components within  normal limits  HEPATIC FUNCTION PANEL - Abnormal; Notable for the following components:   Total Bilirubin 1.5 (*)    Bilirubin, Direct 0.4 (*)    Indirect Bilirubin 1.1 (*)    All other components within normal limits  ETHANOL  MAGNESIUM   PROTIME-INR  APTT  URINALYSIS, ROUTINE W REFLEX MICROSCOPIC     EKG  ED ECG REPORT I, Carlin Palin, the attending physician, personally viewed and interpreted this ECG.   Date: 03/12/2023  EKG Time: 9:01  Rate: 68  Rhythm: normal sinus rhythm  Axis: Normal  Intervals:first-degree A-V block   ST&T Change: None  RADIOLOGY CT head reviewed and interpreted by me with no hemorrhage or midline shift.  PROCEDURES:  Critical Care performed: No  Procedures   MEDICATIONS ORDERED IN ED: Medications  thiamine  (VITAMIN B1) 500 mg in sodium chloride  0.9 % 50 mL IVPB (500 mg Intravenous  New Bag/Given 03/12/23 1003)  sodium chloride  0.9 % bolus 1,000 mL (has no administration in time range)     IMPRESSION / MDM / ASSESSMENT AND PLAN / ED COURSE  I reviewed the triage vital signs and the nursing notes.                              68 y.o. male with past medical history of hypertension, hyperlipidemia, and stroke who presents to the ED complaining of left eye blurry vision for the past 2 days, this morning with difficulty walking and falling to the left-hand side.  Patient's presentation is most consistent with acute presentation with potential threat to life or bodily function.  Differential diagnosis includes, but is not limited to, stroke, TIA, anemia, electrolyte abnormality, AKI, alcoholic ketoacidosis, Warnicke's encephalopathy, UTI.  Patient nontoxic-appearing and in no acute distress, vital signs remarkable for hypertension.  EKG shows no evidence of arrhythmia or ischemia, patient does seem to have subtle left-sided facial droop with slurred speech.  We will further assess for stroke with CT of his head, however he is outside the window for acute intervention given last known well time was greater than 48 hours ago.  I would also consider Warnicke's encephalopathy and we will dose of IV thiamine , labs are pending at this time.  Labs without significant anemia, leukocytosis, electrolyte abnormality, or AKI.  Bilirubin slightly elevated but improved compared to previous, remainder of LFTs are unremarkable.  CT head is negative for acute process, case discussed with hospitalist for admission.      FINAL CLINICAL IMPRESSION(S) / ED DIAGNOSES   Final diagnoses:  Unsteady gait  Alcohol  abuse     Rx / DC Orders   ED Discharge Orders     None        Note:  This document was prepared using Dragon voice recognition software and may include unintentional dictation errors.   Palin Carlin, MD 03/12/23 1018

## 2023-03-12 NOTE — ED Notes (Signed)
 Pt reports having to pee again, when getting to side of bed standing with assistance x2, Pt fell back down onto bed. Informed Pt he can no longer attempt to pee standing up d/t risk of falling. Placed Pt on side of bed to pee, Pt unable to urinate again. Bladder scan obtained and showed 999+mls, MD Eldonna notified via secure chat. MD gave verbal for straight cath. obtained from straight cath at this time, will continue to monitor

## 2023-03-12 NOTE — ED Notes (Signed)
Informed RN bed assigned 

## 2023-03-12 NOTE — Evaluation (Signed)
 Occupational Therapy Evaluation Patient Details Name: Elijah Murphy MRN: [id number] DOB: [birth year] Today's Date: 03/12/2023   History of Present Illness 68 y.o. male with past medical history of HTN, HLD, mild dementia, and stroke who presents to the ED complaining of left eye blurry vision for the past 2 days, this morning with difficulty walking and falling to the left-hand side.   Clinical Impression   Pt was seen for OT evaluation this date. Spouse present and supportive. Prior to hospital admission, spouse reports that pt was ambulating with a RW and required assist for tub transfers, washing his back, LB dressing, and IADL tasks. Spouse notes that pt has been refusing most medication lately. Pt presents to acute OT demonstrating impaired ADL performance and functional mobility 2/2 impaired cognition, strength, balance (per chart and spouse report), and mild FMC deficits noted in LUE (See OT problem list for additional functional deficits). Per pt/spouse, it appears that L blurry vision has resolved. Pt/spouse endorsing pt is very fatigued this afternoon. Required 2 assist for standing to attempt to use the urinal and pt unable to maintain balance. Anticipate pt requires increased assist for ADL and mobility and would benefit from skilled OT services to address noted impairments and functional limitations (see below for any additional details) in order to maximize safety and independence while minimizing falls risk and caregiver burden. Functional assessment limited 2/2 fatigue this date. Will continue to assess for most appropriate discharge recommendations.     If plan is discharge home, recommend the following: A lot of help with walking and/or transfers;A lot of help with bathing/dressing/bathroom;Assistance with cooking/housework;Assist for transportation;Help with stairs or ramp for entrance;Supervision due to cognitive status;Direct supervision/assist for medications management;Direct  supervision/assist for financial management    Functional Status Assessment  Patient has had a recent decline in their functional status and demonstrates the ability to make significant improvements in function in a reasonable and predictable amount of time.  Equipment Recommendations  Other (comment) (TBD)    Recommendations for Other Services       Precautions / Restrictions Precautions Precautions: Fall Restrictions Weight Bearing Restrictions Per Provider Order: No      Mobility Bed Mobility               General bed mobility comments: deferred, pt/spouse reporting pt very tired and needing rest    Transfers                          Balance                                           ADL either performed or assessed with clinical judgement   ADL Overall ADL's : Needs assistance/impaired                                       General ADL Comments: Pt able to self feeding, anticiapte pt able to complete grooming tasks long sitting in bed with setup. Further assessment pending next session.     Vision         Perception         Praxis         Pertinent Vitals/Pain Pain Assessment Pain Assessment: 0-10 Pain Score: 3  Pain Location: headache Pain Descriptors /  Indicators: Headache Pain Intervention(s): Monitored during session, Repositioned     Extremity/Trunk Assessment Upper Extremity Assessment Upper Extremity Assessment: Generalized weakness;LUE deficits/detail LUE Deficits / Details: mild FMC deficits noted, difficult to assess 2/2 MCI at baseline LUE Coordination: decreased fine motor   Lower Extremity Assessment Lower Extremity Assessment: Generalized weakness       Communication Communication Communication: Other (comment) (increased processing time)   Cognition Arousal: Alert Behavior During Therapy: WFL for tasks assessed/performed Overall Cognitive Status: History of cognitive  impairments - at baseline                                 General Comments: Per spouse and chart pt with mild cognitive decline/mild dementia at baseline. Pt very tired during evaluation but appropriate with simple questions, able to follow 2 step command with cues and increased time     General Comments       Exercises     Shoulder Instructions      Home Living Family/patient expects to be discharged to:: Private residence Living Arrangements: Spouse/significant other Available Help at Discharge: Family;Available 24 hours/day;Available PRN/intermittently (spouse retired now, 24/7; pt's sister lives next door and can assist PRN) Type of Home: House Home Access: Stairs to enter [employer] of Steps: 6 Entrance Stairs-Rails: Can reach both Home Layout: One level     Bathroom Shower/Tub: Tub/shower unit;Walk-in shower   Bathroom Toilet: Handicapped height     Home Equipment: [employer] (2 wheels);BSC/3in1;Toilet riser          Prior Functioning/Environment Prior Level of Function : Needs assist             Mobility Comments: spouse confirms pt was walking with the RW ADLs Comments: spouse confirms pt requiring assist for tub transfers, washing his back, LB dressing, and IADL tasks. Pt has been refusing certain medications per wife report.        OT Problem List: Decreased strength;Decreased coordination;Decreased activity tolerance;Decreased safety awareness;Decreased cognition;Impaired vision/perception;Impaired balance (sitting and/or standing)      OT Treatment/Interventions: Self-care/ADL training;Therapeutic exercise;Therapeutic activities;Cognitive remediation/compensation;DME and/or AE instruction;Patient/family education;Balance training    OT Goals(Current goals can be found in the care plan section) Acute Rehab OT Goals Patient Stated Goal: get better OT Goal Formulation: With patient/family Time For Goal Achievement:  03/26/23 Potential to Achieve Goals: Good ADL Goals Pt Will Perform Upper Body Dressing: sitting;with caregiver independent in assisting Pt Will Transfer to Toilet: ambulating;bedside commode (LRAD, caregiver indep in assisting) Pt Will Perform Toileting - Clothing Manipulation and hygiene: with caregiver independent in assisting Additional ADL Goal #1: Pt/family will verbalize plan to implement at least 1 learned falls prevention strategy.  OT Frequency: Min 1X/week    Co-evaluation              AM-PAC OT 6 Clicks Daily Activity     Outcome Measure Help from another person eating meals?: None Help from another person taking care of personal grooming?: A Little Help from another person toileting, which includes using toliet, bedpan, or urinal?: A Lot Help from another person bathing (including washing, rinsing, drying)?: A Lot Help from another person to put on and taking off regular upper body clothing?: A Lot Help from another person to put on and taking off regular lower body clothing?: A Lot 6 Click Score: 15   End of Session    Activity Tolerance: Patient limited by fatigue Patient left: in bed;with call bell/phone  within reach;with family/visitor present  OT Visit Diagnosis: Other abnormalities of gait and mobility (R26.89);Other symptoms and signs involving cognitive function;Muscle weakness (generalized) (M62.81)                Time: 8571-8548 OT Time Calculation (min): 23 min Charges:  OT General Charges $OT Visit: 1 Visit OT Evaluation $OT Eval Low Complexity: 1 Low  Warren SAUNDERS., MPH, MS, OTR/L ascom [phone] 03/12/23, 3:48 PM

## 2023-03-12 NOTE — Assessment & Plan Note (Signed)
 Hgb stable at 14.8

## 2023-03-12 NOTE — H&P (Addendum)
 History and Physical    Patient: Elijah Murphy FMW:[id number] DOB: 04-Jul-1955 DOA: 03/12/2023 DOS: the patient was seen and examined on 03/12/2023 PCP: Franchot Houston, PA-C  Patient coming from: Home  Chief Complaint:  Chief Complaint  Patient presents with   Weakness   HPI: Elijah Murphy is a 68 y.o. male with medical history significant of alcohol  abuse, hypertension, hyperlipidemia, right eye blindness presenting with left-sided blurry vision and left-sided hemiparesis concerning for suspected CVA.  History from both patient and wife at the bedside.  Per report, patient went left eye blurry vision with past 2 days or so.  Baseline right eye blindness.  Per the wife, patient chronically drinks  6 ounces of bourbon daily though he is minimally ambulatory.  Wife states she gets alcohol  for him.  Had new onset left-sided weakness around 2 AM per the patient.  positive difficulty with ambulation.  found down on the floor.  Chronic slurred speech.  No chest pain or shortness of breath.  No reported change in alcohol  intake. Chronic alcohol  related neuropathy.  No fevers or chills.  No abdominal pain.  No reported diarrhea.  Does not take aspirin . Presented to the ER afebrile, blood pressure 190s to 220s over 90s to 110s.  Satting well on room air.  White count 5.6, hemoglobin 14.8, platelets 149, creatinine 0.99, glucose 143, alcohol  level within normal limits.  LFT stable.  T. bili 1.5.  INR 1.1.  CT head grossly stable. Review of Systems: As mentioned in the history of present illness. All other systems reviewed and are negative. Past Medical History:  Diagnosis Date   E. coli infection    Hyperlipidemia 08/10/2016   Hypertension    Past Surgical History:  Procedure Laterality Date   APPENDECTOMY     KNEE SURGERY Left    TONSILLECTOMY     Social History:  reports that he has never smoked. His smokeless tobacco use includes chew. He reports current alcohol  use. He reports that he does not  use drugs.  Allergies  Allergen Reactions   Sulfa Antibiotics Hives    History reviewed. No pertinent family history.  Prior to Admission medications   Medication Sig Start Date End Date Taking? Authorizing Provider  gabapentin (NEURONTIN) 100 MG capsule Take 200 mg by mouth 3 (three) times daily as needed. 07/31/22 07/31/23  [provider]  lisinopril  (ZESTRIL ) 20 MG tablet Take 1 tablet (20 mg total) by mouth daily. 01/31/22   Jens Durand, MD  mirtazapine  (REMERON  SOL-TAB) 15 MG disintegrating tablet Take 1 tablet (15 mg total) by mouth at bedtime. 12/06/22 01/05/23  Jhonny Calvin NOVAK, MD  ondansetron  (ZOFRAN -ODT) 4 MG disintegrating tablet Take 1 tablet (4 mg total) by mouth every 8 (eight) hours as needed for nausea or vomiting. 12/06/22   Jhonny Calvin B, MD  rosuvastatin (CRESTOR) 20 MG tablet Take 20 mg by mouth daily.    [provider]  sertraline  (ZOLOFT ) 25 MG tablet Take 1 tablet (25 mg total) by mouth daily. 01/31/22   Jens Durand, MD  silodosin  (RAPAFLO ) 4 MG CAPS capsule TAKE 1 CAPSULE (4 MG TOTAL) BY MOUTH DAILY WITH BREAKFAST. 11/12/22   Stoioff, Glendia BROCKS, MD  Vitamin D , Ergocalciferol , (DRISDOL) 1.25 MG (50000 UNIT) CAPS capsule Take 50,000 Units by mouth once a week. 07/10/22   [provider]    Physical Exam: Vitals:   03/12/23 0902 03/12/23 0904 03/12/23 0930 03/12/23 1000  BP:   (!) 198/104 (!) 198/96  Pulse:  72 60  Resp:   13 10  Temp:  97.6 F (36.4 C)    SpO2:   99% 99%  Weight: 73.5 kg     Height: 5' 9 (1.753 m)      Physical Exam Constitutional:      Appearance: He is normal weight.  HENT:     Head: Normocephalic and atraumatic.     Mouth/Throat:     Mouth: Mucous membranes are moist.  Eyes:     Comments: R pupil non reactive  L pupil mildly reactive to light    Cardiovascular:     Rate and Rhythm: Normal rate and regular rhythm.  Pulmonary:     Effort: Pulmonary effort is normal.  Abdominal:     General:  Bowel sounds are normal.  Musculoskeletal:     Cervical back: Normal range of motion.     Comments: + generalized weakness    Skin:    General: Skin is warm.  Neurological:     Comments: + generalized weakness  Difficulty w/ eye tracking on exam- noted chronic R eye blindness    Psychiatric:        Mood and Affect: Mood normal.     Data Reviewed:  There are no new results to review at this time.  CT HEAD WO CONTRAST ( ) CLINICAL DATA:  Neuro deficit, acute, stroke suspected. Difficulty walking. Visual disturbance.  EXAM: CT HEAD WITHOUT CONTRAST  TECHNIQUE: Contiguous axial images were obtained from the base of the skull through the vertex without intravenous contrast.  RADIATION DOSE REDUCTION: This exam was performed according to the departmental dose-optimization program which includes automated exposure control, adjustment of the mA and/or kV according to patient size and/or use of iterative reconstruction technique.  COMPARISON:  01/25/2022  FINDINGS: Brain: Moderate atrophic changes. Advanced chronic small-vessel ischemic changes of the hemispheric white matter. No sign of acute infarction, mass lesion, hemorrhage, hydrocephalus or extra-axial collection.  Vascular: There is atherosclerotic calcification of the major vessels at the base of the brain.  Skull: Negative  Sinuses/Orbits: Clear/normal  Other: None  IMPRESSION: No acute CT finding. Moderate atrophic changes. Advanced chronic small-vessel ischemic changes of the hemispheric white matter.  Electronically Signed   By: Oneil Officer M.D.   On: 03/12/2023 09:52  Lab Results  Component Value Date   WBC 5.6 03/12/2023   HGB 14.8 03/12/2023   HCT 43.3 03/12/2023   MCV 100.5 (H) 03/12/2023   PLT 149 (L) 03/12/2023   Last metabolic panel Lab Results  Component Value Date   GLUCOSE 143 (H) 03/12/2023   NA 138 03/12/2023   K 4.7 03/12/2023   CL 103 03/12/2023   CO2 21 (L) 03/12/2023    BUN 13 03/12/2023   CREATININE 0.99 03/12/2023   GFRNONAA >60 03/12/2023   CALCIUM 8.7 (L) 03/12/2023   PHOS 3.2 01/30/2022   PROT 7.1 03/12/2023   ALBUMIN 4.3 03/12/2023   BILITOT 1.5 (H) 03/12/2023   ALKPHOS 47 03/12/2023   AST 35 03/12/2023   ALT 30 03/12/2023   ANIONGAP 14 03/12/2023    Assessment and Plan: * Suspected cerebrovascular accident (CVA) New onset left-sided blurry vision x 1 to 2 days with left-sided weakness and hemiparesis since 2 AM Baseline alcohol  abuse and chronic hepatic encephalopathy is a confounding issue WKS encephalopathy  is also on differential diagnosis CT head within normal limits Will plan for formal stroke evaluation including MRI of the brain, CTA of the head neck, 2D echo, risk stratification labs Start  thiamine , folate, multivitamin supplementation  Ammonia level x 1 Baby asa  Monitor  Neurology consultation as indicated   Essential hypertension Poorly controlled at baseline Blood pressure 190s to 220s over 90s to 110s Allow for permissive hypertension in the setting of CVA evaluation As needed IV labetalol  for pressure greater than 220 or diastolic pressure greater than 110 Monitor  ETOH abuse Patient regularly drinking at least 6 ounces of bourbon daily Longstanding issue Will place on CIWA protocol with vitamin supplementation Patient noted to be minimally ambulatory-there is a strong enabling portion of this as wife is bringing alcohol  to the patient. Discussed importance of cessation with patient and wife at the bedside Monitor  Anemia Hgb stable at 14.8       Advance Care Planning:   Code Status: Full Code   Consults: Neuro consult as clinically indicated   Family Communication: Wife at the bedside   Severity of Illness: The appropriate patient status for this patient is OBSERVATION. Observation status is judged to be reasonable and necessary in order to provide the required intensity of service to ensure the  patient's safety. The patient's presenting symptoms, physical exam findings, and initial radiographic and laboratory data in the context of their medical condition is felt to place them at decreased risk for further clinical deterioration. Furthermore, it is anticipated that the patient will be medically stable for discharge from the hospital within 2 midnights of admission.   Author: Elspeth JINNY Masters, MD 03/12/2023 11:00 AM  For on call review [email or web address].

## 2023-03-12 NOTE — Evaluation (Addendum)
 Clinical/Bedside Swallow Evaluation Patient Details  Name: Elijah Murphy MRN: [id number] Date of Birth: [birth year]  Today's Date: 03/12/2023 Time: SLP Start Time (ACUTE ONLY): 1320 SLP Stop Time (ACUTE ONLY): 1420 SLP Time Calculation (min) (ACUTE ONLY): 60 min  Past Medical History:  Past Medical History:  Diagnosis Date   E. coli infection    Hyperlipidemia 08/10/2016   Hypertension    Past Surgical History:  Past Surgical History:  Procedure Laterality Date   APPENDECTOMY     KNEE SURGERY Left    TONSILLECTOMY     HPI:  Per ED H&P and chart notes, pt is a 68 y.o. male with medical history significant of Alcohol  abuse, Mild Dementia, hypertension, UTI symptoms, hyperlipidemia, right eye blindness presenting with left-sided blurry vision and left-sided hemiparesis, concerning s/s of CVA.  History from both patient and wife at the bedside.  Per report, patient went left eye blurry vision with past 2 days or so.  Baseline right eye blindness.  Per the wife, patient chronically drinks only 6 ounces of bourbon daily though he is minimally ambulatory.  Wife states she gets the alcohol  for him.  Had new onset left-sided weakness around 2 AM per the patient, positive difficulty with ambulation and found down on the floor.  Chronic slurred speech.  Chronic alcohol  related neuropathy.  No chest pain or shortness of breath.   Of Note: h/o GI/Esophageal issues in 10/2019 and 02/2020 c/b circumferential wall thickening of the Esophagus which may be  secondary to Esophagitis. Consider evaluation with upper endoscopy per Abd scan. Pt did not f/u w/ GI then.  Unable to urinate post admit; obtained from straight cath by NSG.   IMAGING: MRI- No acute intracranial process. No evidence of acute or subacute  infarct.  2. Loss of the left vertebral artery void, which is nonspecific but  can be seen in the setting of vertebral artery occlusion or slow flow.  Confluent T2  hyperintense signal in the  periventricular white matter, likely the  sequela of moderate chronic small vessel ischemic disease. 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  Pt dips snuff per Wife.    Assessment / Plan / Recommendation  Clinical Impression   Pt seen for BSE today. Pt awake, verbally responded. Wife present giving premorbid information of pt's status in the home. Chart reviewed. Pt has chronic ETOH related neuropathy and slurred speech; chronic dysphagia(thin liquids) since charted by ST services in 01/2022. Pt requires support w/ ADLs in the home per Wife. He has been eating a Regular diet w/ meats/foods cut well by Wife. Wife endorsed some throat clearing in the past month but equated it w/ sinus drainage.  On RA, afebrile. WBC wnl.  OF NOTE: Pt has Baseline dx'd Dementia (mild in 2023 per chart) -- any assessment of Cognition would be recommended post Acuity of illness and post Discharge when pt can be seen/evaluated by his Neurologist.    Pt appears to present w/ Chronic pharyngeal phase dysphagia in setting of declined Cognitive status; Baseline Dementia and alcohol  related neuropathy. ANY Cognitive decline can impact overall awareness/timing of swallow and safety during po tasks which increases risk for aspiration, choking.  Pt's risk for aspiration/aspiration pneumonia is present but can be reduced when following general aspiration precautions and using a modified diet consistency w/ Nectar liquids. Similar was discussed w/ pt/Wife in 01/2022 at ST evaluation -- pt was recommended to use a Dysphagia Drink Cup then to help monitor flow rate  of thin liquids. OF NOTE: pt required min verbal/tactile/visual cues for follow through during po tasks. Pt also has a h/o GERD and Esophageal Dysmotility issues in 2021 per chart notes. ANY Esophageal phase dysmotility can result in retrograde flow and risk for aspiration of Reflux material.    Pt consumed several trials of ice chips, purees, soft  solids and thin/Nectar liquids w/ overt clinical s/s of aspiration noted w/ thin liquid trials -- both delayed and immediate coughing post swallowing x3/5 trials. No overt clinical s/s of aspiration noted w/ remaining consistencies; no decline in vocal quality, no cough, and no decline in respiratory status during/post trials. O2 sats remained in upper 90s. Oral phase was functional for bolus management, mastication, and oral clearing of the boluses given. Mastication of soft solids appropriate. Pt attempted self-feeding but required Min support -- was able to hold own Cup during drinking which improves safety of swallowing. OM Exam revealed No unilateral weakness BUT overall lingual weakness noted(anterior, posterior, laterally). Gag reflex+. Some confusion of OM tasks and oral care -- suspect impact from Cognitive decline.     D/t pt's Baseline of declined Cognitive status/Dementia and pharyngeal phase Dysphagia w/ Acute Illness, recommend initiation of the dysphagia level 3(mech soft) w/ Nectar liquids via Cup; general aspiration precautions; reduce Distractions during meals and engage pt during po's at meal for self-feeding. Pills Whole vs Crushed in Puree for safer swallowing now and at Discharge. Support w/ feeding at meals as needed. MD/NSG updated.  ST services recommends follow w/ Palliative Care for GOC and education re: impact of pt's comorbidities and his Cognitive decline/Dementia on swallowing. Dietician f/u as needed.  ST services will follow while admitted for trials of diet upgrade; MBSS TBD. Education w/ pt/Wife. Pt appears at/close to his Baseline from a dysphagia standpoint. Largely suspect that pt's Chronic medical issues could hamper upgrade of diet back to thin liquids. Precautions posted in room; chart.  Education w/ Wife/pt on benefits of dysphagia diet; impact of Cognitive decline/Dementia and weakness on swallowing; Nectar consistency liquids; aspiration precautions. Wife agreed. SLP  Visit Diagnosis: Dysphagia, pharyngeal phase (R13.13) (baseline; baseline Dementia)    Aspiration Risk  Mild aspiration risk;Moderate aspiration risk;Risk for inadequate nutrition/hydration    Diet Recommendation   Nectar;Dysphagia 3 (mechanical soft) (moistened foods) = dysphagia level 3(mech soft) w/ Nectar liquids via Cup; general aspiration precautions; reduce Distractions during meals and engage pt during po's at meal for self-feeding. Pills Whole vs Crushed in Puree for safer swallowing now and at Discharge. Support w/ feeding at meals as needed.  Medication Administration: Whole meds with puree (vs Crushed in Puree)    Other  Recommendations Recommended Consults: Consider GI evaluation;Consider esophageal assessment (Dietician; Palliative Care for support) Oral Care Recommendations: Oral care BID;Oral care before and after PO;Staff/trained caregiver to provide oral care Caregiver Recommendations: Avoid jello, ice cream, thin soups, popsicles;Remove water pitcher;Have oral suction available    Recommendations for follow up therapy are one component of a multi-disciplinary discharge planning process, led by the attending physician.  Recommendations may be updated based on patient status, additional functional criteria and insurance authorization.  Follow up Recommendations Follow physician's recommendations for discharge plan and follow up therapies (tbd)      Assistance Recommended at Discharge  FULL  Functional Status Assessment Patient has had a recent decline in their functional status and/or demonstrates limited ability to make significant improvements in function in a reasonable and predictable amount of time  Frequency and Duration min 2x/week  2  weeks       Prognosis Prognosis for improved oropharyngeal function: Fair Barriers to Reach Goals: Cognitive deficits;Time post onset;Severity of deficits;Behavior Barriers/Prognosis Comment: ETOH abuse; Dementia.      Swallow  Study   General Date of Onset: 03/12/23 HPI: Per ED H&P and chart notes, pt is a 68 y.o. male with medical history significant of Alcohol  abuse, Mild Dementia, hypertension, UTI symptoms, hyperlipidemia, right eye blindness presenting with left-sided blurry vision and left-sided hemiparesis, concerning s/s of CVA.  History from both patient and wife at the bedside.  Per report, patient went left eye blurry vision with past 2 days or so.  Baseline right eye blindness.  Per the wife, patient chronically drinks only 6 ounces of bourbon daily though he is minimally ambulatory.  Wife states she gets the alcohol  for him.  Had new onset left-sided weakness around 2 AM per the patient, positive difficulty with ambulation and found down on the floor.  Chronic slurred speech.  Chronic alcohol  related neuropathy.  No chest pain or shortness of breath.  Of Note: h/o GI/Esophageal issues in 10/2019 and 02/2020 c/b circumferential wall thickening of the Esophagus which may be  secondary to Esophagitis. Consider evaluation with upper endoscopy per Abd scan. Pt did not f/u w/ GI then.  Unable to urinate post admit; obtained from straight cath by NSG.   IMAGING: MRI- No acute intracranial process. No evidence of acute or subacute  infarct.  2. Loss of the left vertebral artery void, which is nonspecific but  can be seen in the setting of vertebral artery occlusion or slow flow.  Confluent T2  hyperintense signal in the periventricular white matter, likely the  sequela of moderate chronic small vessel ischemic disease. 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  Pt dips snuff per Wife. Type of Study: Bedside Swallow Evaluation Previous Swallow Assessment: 01/2022 Diet Prior to this Study: Dysphagia 3 (mechanical soft);Thin liquids (Level 0) (w/ rec of a Dysphagia drink cup and aspiration precautions in 2023) Temperature Spikes Noted: No (wbc 5.6) Respiratory Status: Room air History of Recent  Intubation: No Behavior/Cognition: Alert;Cooperative;Pleasant mood;Confused;Requires cueing;Distractible (Wife present) Oral Cavity Assessment: Dry (dark tongue from snuff dipping (per wife)) Oral Care Completed by SLP: Yes Oral Cavity - Dentition: Adequate natural dentition Vision: Functional for self-feeding (R eye vision deficits) Self-Feeding Abilities: Able to feed self;Needs assist;Needs set up;Total assist Patient Positioning: Upright in bed (needed support for upright sitting) Baseline Vocal Quality: Normal Volitional Cough: Strong Volitional Swallow: Able to elicit    Oral/Motor/Sensory Function Overall Oral Motor/Sensory Function: Generalized oral weakness (tongue) Facial ROM: Within Functional Limits Facial Symmetry: Within Functional Limits Lingual ROM: Within Functional Limits Lingual Symmetry: Within Functional Limits Lingual Strength: Reduced;Suspected CN XII (hypoglossal) dysfunction Mandible: Within Functional Limits   Ice Chips Ice chips: Within functional limits Presentation: Spoon (fed; 5 trials)   Thin Liquid Thin Liquid: Impaired Presentation: Cup;Self Fed (5 trials) Oral Phase Impairments:  (wfl) Pharyngeal  Phase Impairments: Suspected delayed Swallow;Cough - Immediate;Cough - Delayed (x3/5 trials)    Nectar Thick Nectar Thick Liquid: Within functional limits Presentation: Cup;Self Fed (~3 ozs)   Honey Thick Honey Thick Liquid: Not tested   Puree Puree: Within functional limits Presentation: Spoon (fed; 10+ trials)   Solid     Solid: Within functional limits Presentation: Spoon (fed; 7 trials) Oral Phase Impairments:  (took his time) Pharyngeal Phase Impairments:  (none) Other Comments: moistened well        Comer Portugal,  MS, CCC-SLP Speech Language Pathologist Rehab Services; [facility] [place] [phone] (ascom) Rhemi Balbach 03/12/2023,4:43 PM

## 2023-03-12 NOTE — ED Triage Notes (Signed)
 Pt to ED [facility] for difficulty ambulating, n/v. Left eye vision impairment started yesterday. Reports leaning to left today. 4mg  zofran  IM PTA, pt vomiting on arrival.  Pt is blind in right eye.  Noticed leaning and difficulty ambulating at 0200 this am, went to bed at 2100 last night.  Has not taken bp meds in a few months, wife states pt would rather drink bourbon. Last eTOH 1800 last night. Daily drinker.  +h/a

## 2023-03-13 ENCOUNTER — Inpatient Hospital Stay: Payer: Medicare HMO

## 2023-03-13 DIAGNOSIS — F1010 Alcohol abuse, uncomplicated: Secondary | ICD-10-CM | POA: Diagnosis not present

## 2023-03-13 LAB — URINALYSIS, ROUTINE W REFLEX MICROSCOPIC
Bilirubin Urine: NEGATIVE
Glucose, UA: 50 mg/dL — AB
Ketones, ur: 20 mg/dL — AB
Leukocytes,Ua: NEGATIVE
Nitrite: NEGATIVE
Protein, ur: NEGATIVE mg/dL
RBC / HPF: >50 RBC/hpf (ref 0–5)
Specific Gravity, Urine: 1.033 — ABNORMAL HIGH (ref 1.005–1.030)
Squamous Epithelial / HPF: 0 /[HPF] (ref 0–5)
pH: 7 (ref 5.0–8.0)

## 2023-03-13 LAB — BASIC METABOLIC PANEL
Anion gap: 14 (ref 5–15)
BUN: 11 mg/dL (ref 8–23)
CO2: 23 mmol/L (ref 22–32)
Calcium: 8.9 mg/dL (ref 8.9–10.3)
Chloride: 101 mmol/L (ref 98–111)
Creatinine, Ser: 0.84 mg/dL (ref 0.61–1.24)
GFR, Estimated: >60 mL/min (ref >60)
Glucose, Bld: 137 mg/dL — ABNORMAL HIGH (ref 70–99)
Potassium: 3.3 mmol/L — ABNORMAL LOW (ref 3.5–5.1)
Sodium: 138 mmol/L (ref 135–145)

## 2023-03-13 LAB — LIPID PANEL
Cholesterol: 253 mg/dL — ABNORMAL HIGH (ref 0–200)
HDL: 86 mg/dL (ref >40)
LDL Cholesterol: 137 mg/dL — ABNORMAL HIGH (ref 0–99)
Total CHOL/HDL Ratio: 2.9 {ratio}
Triglycerides: 149 mg/dL (ref <150)
VLDL: 30 mg/dL (ref 0–40)

## 2023-03-13 LAB — PHOSPHORUS: Phosphorus: 3 mg/dL (ref 2.5–4.6)

## 2023-03-13 LAB — MAGNESIUM: Magnesium: 1.8 mg/dL (ref 1.7–2.4)

## 2023-03-13 MED ORDER — LORAZEPAM 2 MG/ML IJ SOLN
0.5000 mg | Freq: Once | INTRAMUSCULAR | Status: AC
  Administered 2023-03-13: 0.5 mg via INTRAVENOUS
  Filled 2023-03-13: qty 1

## 2023-03-13 MED ORDER — POTASSIUM CHLORIDE 20 MEQ PO PACK
20.0000 meq | PACK | Freq: Once | ORAL | Status: AC
  Administered 2023-03-13: 20 meq via ORAL
  Filled 2023-03-13: qty 1

## 2023-03-13 MED ORDER — NALTREXONE HCL 50 MG PO TABS
50.0000 mg | ORAL_TABLET | Freq: Every day | ORAL | Status: DC
  Administered 2023-03-13 – 2023-03-16 (×3): 50 mg via ORAL
  Filled 2023-03-13 (×7): qty 1

## 2023-03-13 MED ORDER — ASPIRIN 81 MG PO CHEW
81.0000 mg | CHEWABLE_TABLET | Freq: Every day | ORAL | Status: DC
  Administered 2023-03-13 – 2023-03-16 (×3): 81 mg via ORAL
  Filled 2023-03-13 (×3): qty 1

## 2023-03-13 MED ORDER — LISINOPRIL 10 MG PO TABS
10.0000 mg | ORAL_TABLET | Freq: Every day | ORAL | Status: DC
  Administered 2023-03-13 – 2023-03-14 (×2): 10 mg via ORAL
  Filled 2023-03-13 (×2): qty 1

## 2023-03-13 MED ORDER — CHLORHEXIDINE GLUCONATE CLOTH 2 % EX PADS
6.0000 | MEDICATED_PAD | Freq: Every day | CUTANEOUS | Status: DC
  Administered 2023-03-13 – 2023-03-17 (×5): 6 via TOPICAL

## 2023-03-13 MED ORDER — MIRTAZAPINE 15 MG PO TBDP
15.0000 mg | ORAL_TABLET | Freq: Every day | ORAL | Status: DC
  Administered 2023-03-13 – 2023-03-16 (×4): 15 mg via ORAL
  Filled 2023-03-13 (×4): qty 1

## 2023-03-13 NOTE — Progress Notes (Addendum)
  Progress Note   Patient: Elijah Murphy FMW:[id number] DOB: 08/25/1955 DOA: 03/12/2023     1 DOS: the patient was seen and examined on 03/13/2023   Brief hospital course: Blurred vision in no vz in R eye.   Assessment and Plan: * L sided weakness MRI brain negative for acute stroke.  CT head within normal limits Continue thiamine , folate, multivitamin supplementation  Ammonia level x 1 Baby asa  Likely in the setting of his chronic alcohol  use -Continue PT/OT  -flutter valve for chest congestion   Essential hypertension Poorly controlled at baseline Blood pressure 190s to 220s over 90s to 110s Resumed low dose lisinopril  as patient does not routinely take his medicaitons   ETOH abuse Patient regularly drinking at least 6 ounces of bourbon daily Longstanding issue Will place on CIWA protocol with vitamin supplementation Patient noted to be minimally ambulatory-there is a strong enabling portion of this as wife is bringing alcohol  to the patient. Discussed importance of cessation with patient and wife at the bedside -Start patient on naltrexone  for this   Weak cough Aspiration precautions   Elevated Tbili  Likely due to alcohol  use CTM   Hypokalemia Replete   Acute metabolic acidosis Resolved this AM.  Patient p/w decreased bicarb and mildly elevated anion gap. It was likely related to his heavy alcohol  use and alcoholic ketoacidosis versus a component of starvation ketosis given his poor oral intake. This improved with IVFluids.     Subjective: Has a weak cough. Still feels weaker than usual. L eye vision is still blurry but improved.   Physical Exam: Vitals:   03/13/23 1119 03/13/23 1359 03/13/23 1548 03/13/23 1913  BP: (!) 189/110 (!) 139/93 (!) 146/90 (!) 145/95  Pulse: 94 79 71 80  Resp: 20 18 18 18   Temp: 97.7 F (36.5 C) 97.9 F (36.6 C) 97.7 F (36.5 C) 97.9 F (36.6 C)  TempSrc: Axillary  Axillary   SpO2: 95% 94% 94% 94%  Weight:      Height:        Physical Exam  Constitutional: In no distress. Ill appearing  Eye: R eye blindness, L eye noted could read numbers on clock on wall from bed ~62feet.  Cardiovascular: Normal rate, regular rhythm. No lower extremity edema  Pulmonary: Non labored breathing on room air, no wheezing or rales.   Abdominal: Soft. Normal bowel sounds. Non distended and non tender Musculoskeletal: Normal range of motion.     Neurological: Alert and oriented to person, place, and time. BLE 4+/5, BUE 5/5  Skin: Skin is warm and dry.   Data Reviewed:  I have reviewed all labs and imaging studies   Family Communication: spouse at bedside   Disposition: Status is: Inpatient Remains inpatient appropriate because: continued w/u of L sided weakness and discharge planing   Planned Discharge Destination:  Pending clinical course     Time spent: 40 minutes  Author: Alban Pepper, MD 03/13/2023 7:34 PM  For on call review [email or web address].

## 2023-03-13 NOTE — Plan of Care (Signed)

## 2023-03-13 NOTE — Evaluation (Signed)
 Physical Therapy Evaluation Patient Details Name: Elijah Murphy MRN: [id number] DOB: 03/09/1955 Today's Date: 03/13/2023  History of Present Illness  Pt is a 68 y.o. male with past medical history of HTN, HLD, mild dementia, and stroke who presents to the ED complaining of left eye blurry vision for 2 days with difficulty walking and falling to the left-hand side.  Per MRI impression No acute intracranial process. No evidence of acute or subacute  infarct.  MD assessement includes: HTN, ETOH abuse, and anemia.   Clinical Impression  Pt was pleasant and motivated to participate during the session and put forth good effort throughout.  Pt required mod to max physical assistance with bed mobility tasks and once in sitting at the EOB presented with profound L lateral lean.  Pt participated in extensive R lateral weight shifting activities at the EOB and with each progressively further R lateral lean the pt was able to momentarily present with improved static sitting balance but each time would quickly revert back to heavy L lateral lean.  Pt required near constant max A to maintain neutral sitting with transfer assessment deferred for pt safety.  Pt with some noted productive coughing during the session with some drooling and weak spitting into a towel.  Nursing notified with suction recommended.  Pt will benefit from continued PT services upon discharge to safely address deficits listed in patient problem list for decreased caregiver assistance and eventual return to PLOF.           If plan is discharge home, recommend the following: Two people to help with walking and/or transfers;Two people to help with bathing/dressing/bathroom;Direct supervision/assist for medications management;Assistance with feeding;Help with stairs or ramp for entrance;Assist for transportation   Can travel by private vehicle   No    Equipment Recommendations Other (comment) (TBD at next venue of care)  Recommendations  for Other Services       Functional Status Assessment Patient has had a recent decline in their functional status and/or demonstrates limited ability to make significant improvements in function in a reasonable and predictable amount of time     Precautions / Restrictions Precautions Precautions: Fall Restrictions Weight Bearing Restrictions Per Provider Order: No      Mobility  Bed Mobility Overal bed mobility: Needs Assistance Bed Mobility: Supine to Sit, Sit to Supine     Supine to sit: Mod assist Sit to supine: Max assist   General bed mobility comments: Mod to max A for BLE and trunk control    Transfers                   General transfer comment: NT secondary to very poor static sitting balance with heavy lean to the L requiring mostly max A to correct to neutral    Ambulation/Gait                  Stairs            Wheelchair Mobility     Tilt Bed    Modified Rankin (Stroke Patients Only)       Balance Overall balance assessment: Needs assistance   Sitting balance-Leahy Scale: Zero   Postural control: Left lateral lean                                   Pertinent Vitals/Pain Pain Assessment Pain Assessment: No/denies pain    Home Living Family/patient expects to  be discharged to:: Private residence Living Arrangements: Spouse/significant other Available Help at Discharge: Family;Available 24 hours/day   Home Access: Stairs to enter Entrance Stairs-Rails: Can reach both;Right;Left Entrance Stairs-Number of Steps: 6   Home Layout: One level Home Equipment: Agricultural Consultant (2 wheels);BSC/3in1;Toilet riser;Cane - single point Additional Comments: Spouse retired and it trainer, sister lives next door and available PRN    Prior Function Prior Level of Function : Needs assist             Mobility Comments: Per patient Mod Ind amb with a RW in the home and a SPC limited community distances, no fall  history ADLs Comments: Per chart review spouse confirms pt requiring assist for tub transfers, washing his back, LB dressing, and IADL tasks. Pt has been refusing certain medications per wife report.     Extremity/Trunk Assessment   Upper Extremity Assessment Upper Extremity Assessment: Defer to OT evaluation    Lower Extremity Assessment Lower Extremity Assessment: Generalized weakness       Communication   Communication Communication: Other (comment) (extra time and cuing to process commands) Cueing Techniques: Verbal cues;Tactile cues  Cognition Arousal: Alert Behavior During Therapy: WFL for tasks assessed/performed Overall Cognitive Status: No family/caregiver present to determine baseline cognitive functioning                                          General Comments      Exercises Other Exercises Other Exercises: R lateral weight shifting activities in sitting to address profound L lateral lean   Assessment/Plan    PT Assessment Patient needs continued PT services  PT Problem List Decreased strength;Decreased activity tolerance;Decreased balance;Decreased mobility;Decreased knowledge of use of DME       PT Treatment Interventions DME instruction;Gait training;Stair training;Functional mobility training;Therapeutic activities;Therapeutic exercise;Balance training;Patient/family education    PT Goals (Current goals can be found in the Care Plan section)  Acute Rehab PT Goals PT Goal Formulation: Patient unable to participate in goal setting Time For Goal Achievement: 03/26/23 Potential to Achieve Goals: Fair    Frequency Min 1X/week     Co-evaluation               AM-PAC PT 6 Clicks Mobility  Outcome Measure Help needed turning from your back to your side while in a flat bed without using bedrails?: A Lot Help needed moving from lying on your back to sitting on the side of a flat bed without using bedrails?: A Lot Help needed  moving to and from a bed to a chair (including a wheelchair)?: Total Help needed standing up from a chair using your arms (e.g., wheelchair or bedside chair)?: Total Help needed to walk in hospital room?: Total Help needed climbing 3-5 steps with a railing? : Total 6 Click Score: 8    End of Session   Activity Tolerance: Patient tolerated treatment well Patient left: in bed;with call bell/phone within reach;with bed alarm set Nurse Communication: Mobility status;Other (comment) (Occasional productive cough during session, pt may benefit from suction) PT Visit Diagnosis: Difficulty in walking, not elsewhere classified (R26.2);Muscle weakness (generalized) (M62.81)    Time: 8383-8359 PT Time Calculation (min) (ACUTE ONLY): 24 min   Charges:   PT Evaluation $PT Eval Moderate Complexity: 1 Mod PT Treatments $Therapeutic Activity: 8-22 mins PT General Charges $$ ACUTE PT VISIT: 1 Visit    D. [name] PT, DPT 03/13/23,  5:08 PM

## 2023-03-13 NOTE — Progress Notes (Signed)
 Notified Modou Jawo NP via secure chat that patient was yellow MEWS and BP is 207/104, HR 83 SPO2 96% RA, only PRN BP med order is labetalol  for SBP >220/ DBP > 110.  Per Modou Jawo, okay to give PRN labetalol  for BP. PRN labetalol  given, will follow yellow MEWS guidelines for vitals.

## 2023-03-13 NOTE — Discharge Instructions (Signed)

## 2023-03-14 ENCOUNTER — Inpatient Hospital Stay: Payer: Medicare HMO

## 2023-03-14 DIAGNOSIS — R2681 Unsteadiness on feet: Secondary | ICD-10-CM | POA: Diagnosis not present

## 2023-03-14 DIAGNOSIS — K567 Ileus, unspecified: Secondary | ICD-10-CM

## 2023-03-14 LAB — BASIC METABOLIC PANEL
Anion gap: 12 (ref 5–15)
BUN: 16 mg/dL (ref 8–23)
CO2: 24 mmol/L (ref 22–32)
Calcium: 8.4 mg/dL — ABNORMAL LOW (ref 8.9–10.3)
Chloride: 100 mmol/L (ref 98–111)
Creatinine, Ser: 0.85 mg/dL (ref 0.61–1.24)
GFR, Estimated: >60 mL/min (ref >60)
Glucose, Bld: 142 mg/dL — ABNORMAL HIGH (ref 70–99)
Potassium: 3.2 mmol/L — ABNORMAL LOW (ref 3.5–5.1)
Sodium: 136 mmol/L (ref 135–145)

## 2023-03-14 LAB — CBC
HCT: 39.3 % (ref 39.0–52.0)
Hemoglobin: 14.1 g/dL (ref 13.0–17.0)
MCH: 34.6 pg — ABNORMAL HIGH (ref 26.0–34.0)
MCHC: 35.9 g/dL (ref 30.0–36.0)
MCV: 96.3 fL (ref 80.0–100.0)
Platelets: 148 10*3/uL — ABNORMAL LOW (ref 150–400)
RBC: 4.08 MIL/uL — ABNORMAL LOW (ref 4.22–5.81)
RDW: 13.2 % (ref 11.5–15.5)
WBC: 9.2 10*3/uL (ref 4.0–10.5)
nRBC: 0 % (ref 0.0–0.2)

## 2023-03-14 LAB — MAGNESIUM: Magnesium: 1.7 mg/dL (ref 1.7–2.4)

## 2023-03-14 MED ORDER — NEPRO/CARBSTEADY PO LIQD: 237.0000 mL | Freq: Three times a day (TID) | ORAL | Status: DC

## 2023-03-14 MED ORDER — DEXTROSE-SODIUM CHLORIDE 5-0.45 % IV SOLN: INTRAVENOUS | Status: AC

## 2023-03-14 MED ORDER — POTASSIUM CHLORIDE 20 MEQ PO PACK
40.0000 meq | PACK | Freq: Once | ORAL | Status: AC
  Administered 2023-03-14: 40 meq via ORAL
  Filled 2023-03-14: qty 2

## 2023-03-14 MED ORDER — LACTULOSE ENEMA
300.0000 mL | Freq: Once | ORAL | Status: AC
  Administered 2023-03-14: 300 mL via RECTAL
  Filled 2023-03-14: qty 300

## 2023-03-14 MED ORDER — MAGNESIUM SULFATE 2 GM/50ML IV SOLN
2.0000 g | Freq: Once | INTRAVENOUS | Status: AC
  Administered 2023-03-14: 2 g via INTRAVENOUS
  Filled 2023-03-14: qty 50

## 2023-03-14 NOTE — Plan of Care (Addendum)
 PMT consulted for GOC. Up to see patient and currently another discipline is at bedside with him. Will reattempt at another time.

## 2023-03-14 NOTE — Progress Notes (Addendum)
  Progress Note   Patient: Elijah Murphy FMW:[id number] DOB: 01/21/1956 DOA: 03/12/2023     2 DOS: the patient was seen and examined on 03/14/2023   Brief hospital course: Blurred vision in no vz in R eye.   Assessment and Plan: * L sided weakness MRI brain negative for acute stroke.  CT head within normal limits Continue thiamine , folate, multivitamin supplementation  Ammonia level x 1 Baby asa  Likely in the setting of his chronic alcohol  use - PT/OT recommending SNF, patient declinging  -flutter valve for chest congestion   Essential hypertension Poorly controlled at baseline Blood pressure improved. Elevation this AM likely due to abdominal distention.  -Continue low dose ACE    ETOH abuse Patient regularly drinking at least 6 ounces of bourbon daily Longstanding issue CIWA protocol with vitamin supplementation Discussed importance of cessation with patient and wife at the bedside Continue naltrexone  for this   Possible ileus Patient with frequent belching and abdominal distention noted this AM. Abd xray obtained and was negative for obstruction. Patient had not had recent bowel movement.  -Will give lactulose  enema  -Possibly place NGT given abdominal distention and Nausea/V and burping.   H/o chronic dysphagia  Weak cough Poor oral intake. Discussed with patient and his spouse that they may have to consider alternative means of nutrition.  -Consult palliative for GOC -IVFs   Mild thrombocytopenia  CTM  Elevated Tbili  Likely due to alcohol  use CTM   Hypokalemia Resolved   H/o Macrocytic anemia Currently Hgb within normal limits      Subjective: Spouse is at bedside and helps with history. He has had frequent belching ON. He had some nausea yesterday and an episode of emesis. He has not had a bowel movement.  Patient and spouse feel his changes in vz and L sided weakness have improved.   Physical Exam: Vitals:   03/14/23 0457 03/14/23 0819 03/14/23 1211  03/14/23 1622  BP: (!) 138/90 (!) 173/104 (!) 173/103 (!) 173/92  Pulse: 88 88 88 82  Resp:   16 18  Temp:  98.2 F (36.8 C) (!) 97.3 F (36.3 C) 97.9 F (36.6 C)  TempSrc:      SpO2: 94% 94% 91% 96%  Weight:      Height:        Physical Exam  Constitutional: In no distress.  Chronically ill appearing  Cardiovascular: Normal rate, regular rhythm. No lower extremity edema  Pulmonary: Non labored breathing on room air, no wheezing or rales.  Abdominal: Soft.MIldly distended and non tender Musculoskeletal: Normal range of motion.     Neurological: Alert and oriented to person, place, and time. BLE 4+5, BUE 5/5 Skin: Skin is warm and dry.    Data Reviewed:  I have reviewed all labs and imaging studies   Family Communication: spouse at bedside   Disposition: Status is: Inpatient Remains inpatient appropriate because: here with likely ileus, making him unable to keep up with hydration    Planned Discharge Destination:  Pending clinical course     Time spent: 40 minutes  Author: Alban Pepper, MD 03/14/2023 4:42 PM  For on call review [email or web address].

## 2023-03-14 NOTE — Progress Notes (Signed)
 Speech Language Pathology Treatment: Dysphagia  Patient Details Name: Elijah Murphy MRN: [id number] DOB: 11/29/1955 Today's Date: 03/14/2023 Time: 1345-1415 SLP Time Calculation (min) (ACUTE ONLY): 30 min  Assessment / Plan / Recommendation Clinical Impression  Pt seen today for ongoing assessment of swallowing; toleration of current dysphagia diet w/ Nectar liquids. Wife present. She stated he didn't like the thickened liquids. She also stated the MD has brought up the topic of feeding tubes - unsure of details of this.  Per Wife, pt still does not feel well and has had ongoing Hiccups since yesterday w/ no rest at all last night. Pt does appear fatigued. Noted ongoing Hiccups at this session. Pt is also awaiting an enema; Wife stated MD is concerned of bowel issues and backup. Wife said that the MD didn't want pt to eat anything right now until it's addressed. Wife also noted that pt's urine is dark -- MD/NSG aware she said.   Held on po trials -- recommend continuing w/ the current Dysphagia diet w/ Nectar liquids anyway in setting of pt's deconditioning/weakness, Cognitive decline/Dementia, chronic dysphagia issues, and acute illness/hospitalization. Rec. Pills Whole vs Crushed in Puree for safer swallowing now and at Discharge. Support w/ feeding at meals as needed.   Instead, discussion and education was had on Topics including: swallowing and dysphagia; pt's risks for aspiration/aspirationpneumonia; impact of Dementia and Cognitive decline on swallowing and oral intake overall; diet consistency rec'd including Nectar liquids; general aspiration precautions; supportive feeding strategies. Precautions were posted in room, chart. Wife's questions answered. Pt mostly rested in bed w/ pillow over his head.   ST services will continue to f/u w/ pt's status and toleration of diet; education w/ pt/Wife on above. Wife agreed. ST services recommends follow w/ Palliative Care for GOC and  education re: impact of pt's comorbidities and his Cognitive decline/Dementia on swallowing. Dietician f/u as needed.  ST services will follow while admitted for trials of diet upgrade; MBSS TBD. Education w/ pt/Wife. Pt appears at/close to his Baseline from a dysphagia standpoint. Largely suspect that pt's Chronic medical issues could hamper upgrade of diet back to thin liquids. Precautions posted in room; chart. MD/NSG updated.        HPI HPI: Per ED H&P and chart notes, pt is a 68 y.o. male with medical history significant of Alcohol  abuse, Mild Dementia, hypertension, UTI symptoms, hyperlipidemia, right eye blindness presenting with left-sided blurry vision and left-sided hemiparesis, concerning s/s of CVA.  History from both patient and wife at the bedside.  Per report, patient went left eye blurry vision with past 2 days or so.  Baseline right eye blindness.  Per the wife, patient chronically drinks only 6 ounces of bourbon daily though he is minimally ambulatory.  Wife states she gets the alcohol  for him.  Had new onset left-sided weakness around 2 AM per the patient, positive difficulty with ambulation and found down on the floor.  Chronic slurred speech.  Chronic alcohol  related neuropathy.  No chest pain or shortness of breath.  Of Note: h/o GI/Esophageal issues in 10/2019 and 02/2020 c/b circumferential wall thickening of the Esophagus which may be  secondary to Esophagitis. Consider evaluation with upper endoscopy per Abd scan. Pt did not f/u w/ GI then.  Unable to urinate post admit; obtained from straight cath by NSG.   IMAGING: MRI- No acute intracranial process. No evidence of acute or subacute  infarct.  2. Loss of the left vertebral artery void, which is nonspecific but  can be seen in the setting of vertebral artery occlusion or slow flow.  Confluent T2  hyperintense signal in the periventricular white matter, likely the  sequela of moderate chronic small vessel ischemic disease.  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  Pt dips snuff per Wife.      SLP Plan  Continue with current plan of care      Recommendations for follow up therapy are one component of a multi-disciplinary discharge planning process, led by the attending physician.  Recommendations may be updated based on patient status, additional functional criteria and insurance authorization.    Recommendations  Diet recommendations: Dysphagia 3 (mechanical soft);Nectar-thick liquid Liquids provided via: Cup Medication Administration: Whole meds with puree (vs Crushed in Puree) Supervision: Staff to assist with self feeding;Full supervision/cueing for compensatory strategies Compensations: Minimize environmental distractions;Slow rate;Lingual sweep for clearance of pocketing;Small sips/bites;Follow solids with liquid Postural Changes and/or Swallow Maneuvers: Out of bed for meals;Seated upright 90 degrees;Upright 30-60 min after meal                 (Dietician f/u; Palliative Care f/u for GOC) Oral care BID;Oral care before and after PO;Staff/trained caregiver to provide oral care   Frequent or constant Supervision/Assistance Dysphagia, pharyngeal phase (R13.13) (baseline; baseline Dementia)     Continue with current plan of care        Comer Portugal, MS, CCC-SLP Speech Language Pathologist Rehab Services; [facility] [phone] (ascom) Carrol Hougland  03/14/2023, 2:29 PM

## 2023-03-14 NOTE — TOC Initial Note (Signed)
 Transition of Care [facility]) - Initial/Assessment Note    Patient Details  Name: Elijah Murphy MRN: [id number] Date of Birth: 1955-05-23  Transition of Care [facility]) CM/SW Contact:    Ladene Lady, LCSW Phone Number: 03/14/2023, 10:31 AM  Clinical Narrative:  CSW met pt at bedside. Pt declining need for SNF. Pt reports he walks at home with a RW and will return to doing so. Pt understands he was unable to walk with PT but states he still is choosing to return home with his wife and HH. Pt reports no transportation barriers as wife transports and no preference for [facility]. Pt declines need for BSC as he reports he uses a bottle and does not want the BSC.  CSW will work on arranging HH.                   Expected Discharge Plan: Home w Home Health Services Barriers to Discharge: Continued Medical Work up   Patient Goals and CMS Choice            Expected Discharge Plan and Services       Living arrangements for the past 2 months: Single Family Home                                      Prior Living Arrangements/Services Living arrangements for the past 2 months: Single Family Home   Patient language and need for interpreter reviewed:: Yes        Need for Family Participation in Patient Care: Yes (Comment) Care giver support system in place?: Yes (comment)      Activities of Daily Living   ADL Screening (condition at time of admission) Independently performs ADLs?: No Does the patient have a NEW difficulty with bathing/dressing/toileting/self-feeding that is expected to last >3 days?: No Does the patient have a NEW difficulty with getting in/out of bed, walking, or climbing stairs that is expected to last >3 days?: Yes (Initiates electronic notice to provider for possible PT consult) Does the patient have a NEW difficulty with communication that is expected to last >3 days?: No Is the patient deaf or have difficulty hearing?: No Does the patient have difficulty  seeing, even when wearing glasses/contacts?: Yes (right blind and left blurry vision) Does the patient have difficulty concentrating, remembering, or making decisions?: No  Permission Sought/Granted Permission sought to share information with : Facility Medical Sales Representative, Sports Coach, Family Supports             Permission granted to share info w Contact Information: home health wife  Emotional Assessment       Orientation: : Oriented to Place, Oriented to  Time, Oriented to Situation, Oriented to Self      Admission diagnosis:  Alcohol  abuse [F10.10] Unsteady gait [R26.81] Suspected cerebrovascular accident (CVA) [R09.89] Patient Active Problem List   Diagnosis Date Noted   Suspected cerebrovascular accident (CVA) 03/12/2023   Alcohol  abuse 03/12/2023   Alcoholic ketoacidosis 12/05/2022   Hyponatremia 12/05/2022   PSA elevation 01/30/2022   Severe sepsis (HCC) 01/30/2022   Bacterial infection due to E. 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   Hematemesis with nausea  Diarrhea    Acute gastroenteritis    Anemia    Sepsis secondary to UTI (HCC) 02/05/2020   AKI (acute kidney injury) (HCC) 02/05/2020   Lactic acidosis 02/05/2020   Acute metabolic encephalopathy 02/05/2020   History of UTI 02/05/2020   Essential hypertension 08/10/2016   PCP:  Franchot Houston, PA-C Pharmacy:   [facility] [address] [place] - [address] [place] [address] Phone: [phone] Fax: [phone]     Social Drivers of Health (SDOH) Social History: SDOH Screenings   Food Insecurity: No Food Insecurity (03/12/2023)  Housing: Low Risk  (03/12/2023)  Transportation Needs: No Transportation  Needs (03/12/2023)  Utilities: Not At Risk (03/12/2023)  Alcohol  Screen: Low Risk  (03/19/2019)  Depression (PHQ2-9): Low Risk  (03/19/2019)  Financial Resource Strain: Low Risk  (05/09/2022)   Received from [facility], [facility]  Social Connections: Socially Integrated (03/12/2023)  Tobacco Use: High Risk (03/12/2023)   SDOH Interventions:     Readmission Risk Interventions     No data to display

## 2023-03-14 NOTE — Plan of Care (Signed)

## 2023-03-15 ENCOUNTER — Inpatient Hospital Stay: Payer: Medicare HMO

## 2023-03-15 DIAGNOSIS — Z7189 Other specified counseling: Secondary | ICD-10-CM

## 2023-03-15 DIAGNOSIS — R0989 Other specified symptoms and signs involving the circulatory and respiratory systems: Secondary | ICD-10-CM | POA: Diagnosis not present

## 2023-03-15 LAB — BASIC METABOLIC PANEL
Anion gap: 10 (ref 5–15)
BUN: 14 mg/dL (ref 8–23)
CO2: 25 mmol/L (ref 22–32)
Calcium: 8.4 mg/dL — ABNORMAL LOW (ref 8.9–10.3)
Chloride: 106 mmol/L (ref 98–111)
Creatinine, Ser: 0.95 mg/dL (ref 0.61–1.24)
GFR, Estimated: >60 mL/min (ref >60)
Glucose, Bld: 119 mg/dL — ABNORMAL HIGH (ref 70–99)
Potassium: 3.3 mmol/L — ABNORMAL LOW (ref 3.5–5.1)
Sodium: 141 mmol/L (ref 135–145)

## 2023-03-15 LAB — LIPASE, BLOOD: Lipase: 27 U/L (ref 11–51)

## 2023-03-15 LAB — PHOSPHORUS: Phosphorus: 3 mg/dL (ref 2.5–4.6)

## 2023-03-15 LAB — MAGNESIUM: Magnesium: 2.1 mg/dL (ref 1.7–2.4)

## 2023-03-15 MED ORDER — CHLORPROMAZINE HCL 25 MG PO TABS
25.0000 mg | ORAL_TABLET | Freq: Three times a day (TID) | ORAL | Status: DC | PRN
  Administered 2023-03-15 – 2023-03-17 (×7): 25 mg via ORAL
  Filled 2023-03-15 (×9): qty 1

## 2023-03-15 MED ORDER — KCL IN DEXTROSE-NACL 20-5-0.45 MEQ/L-%-% IV SOLN
INTRAVENOUS | Status: AC
  Filled 2023-03-15: qty 1000

## 2023-03-15 MED ORDER — LISINOPRIL 20 MG PO TABS
20.0000 mg | ORAL_TABLET | Freq: Every day | ORAL | Status: DC
  Administered 2023-03-16: 20 mg via ORAL
  Filled 2023-03-15: qty 1

## 2023-03-15 MED ORDER — HYDRALAZINE HCL 20 MG/ML IJ SOLN: 10.0000 mg | Freq: Four times a day (QID) | INTRAMUSCULAR | Status: DC | PRN

## 2023-03-15 MED ORDER — LORAZEPAM 2 MG/ML IJ SOLN
0.5000 mg | Freq: Four times a day (QID) | INTRAMUSCULAR | Status: DC | PRN
  Administered 2023-03-16 – 2023-03-17 (×2): 0.5 mg via INTRAVENOUS
  Filled 2023-03-15 (×2): qty 1

## 2023-03-15 MED ORDER — SIMETHICONE 80 MG PO CHEW: 80.0000 mg | CHEWABLE_TABLET | Freq: Four times a day (QID) | ORAL | Status: DC | PRN

## 2023-03-15 MED ORDER — POTASSIUM CHLORIDE 20 MEQ PO PACK: 40.0000 meq | PACK | Freq: Once | ORAL | Status: DC

## 2023-03-15 NOTE — Progress Notes (Signed)
 Physical Therapy Treatment Patient Details Name: Elijah Murphy MRN: [id number] DOB: 01-22-1956 Today's Date: 03/15/2023   History of Present Illness Pt is a 68 y.o. male with past medical history of HTN, HLD, mild dementia, and stroke who presents to the ED complaining of left eye blurry vision for 2 days with difficulty walking and falling to the left-hand side.  Per MRI impression No acute intracranial process. No evidence of acute or subacute  infarct.  MD assessement includes: HTN, ETOH abuse, and anemia.    PT Comments  Co-tx with OT for improved outcomes.  Wife in room.  He transitions with mod a x 1 to EOB.  Session focused on sitting balance with attempts at standing.  He continues with heavy L lean with cues and physical support.  When cued, he is able to sit unsupported but only briefly until he returns to left lean.  Standing x 2 at bedside with RW and mod/max a x 2 to remain upright.  Pt with very wide BOS with L lean and relies heavily on staff for support to prevent fall.  Steps in place are attempted without success due to balance and foot positioning.  At times when standing and from sit to stand transition, he will lift L leg and not use it to assist.  He does seem aware of balance deficits but makes not attempt to correct.  Transfer to chair is deferred for pt and staff safety.  He is positioned in chair position in bed with wife in attendance.    Discussed discharge plan as pt had previously refused SNF.  Wife is aware she cannot manage him at home at this time and they both are in agreement that SNF will be needed unless significant improvement before discharge.  Relayed to TOC.     If plan is discharge home, recommend the following: Two people to help with walking and/or transfers;Two people to help with bathing/dressing/bathroom;Direct supervision/assist for medications management;Assistance with feeding;Help with stairs or ramp for entrance;Assist for transportation   Can  travel by private vehicle        Equipment Recommendations       Recommendations for Other Services       Precautions / Restrictions Precautions Precautions: Fall Restrictions Weight Bearing Restrictions Per Provider Order: No Other Position/Activity Restrictions: NG tube     Mobility  Bed Mobility Overal bed mobility: Needs Assistance Bed Mobility: Supine to Sit, Sit to Supine     Supine to sit: Mod assist Sit to supine: Mod assist, +2 for physical assistance        Transfers Overall transfer level: Needs assistance Equipment used: Rolling walker (2 wheels) Transfers: Sit to/from Stand Sit to Stand: Mod assist, Max assist, +2 physical assistance           General transfer comment: balance and LE positioning negatively affect balance.  unsafe for transfer to chair    Ambulation/Gait               General Gait Details: unable to step in place or sidestep   Stairs             Wheelchair Mobility     Tilt Bed    Modified Rankin (Stroke Patients Only)       Balance Overall balance assessment: Needs assistance Sitting-balance support: Feet supported Sitting balance-Leahy Scale: Zero Sitting balance - Comments: able to maintain sitting balance breifly with no UE support but uanble to leave unaattened for any length of time Postural  control: Left lateral lean   Standing balance-Leahy Scale: Zero Standing balance comment: +2 assist to remain standing                            Cognition Arousal: Alert Behavior During Therapy: WFL for tasks assessed/performed Overall Cognitive Status: Within Functional Limits for tasks assessed                                          Exercises      General Comments        Pertinent Vitals/Pain Pain Assessment Pain Assessment: No/denies pain Pain Intervention(s): Monitored during session    Home Living                          Prior Function             PT Goals (current goals can now be found in the care plan section) Progress towards PT goals: Progressing toward goals    Frequency    Min 1X/week      PT Plan      Co-evaluation PT/OT/SLP Co-Evaluation/Treatment: Yes Reason for Co-Treatment: Complexity of the patient's impairments (multi-system involvement) PT goals addressed during session: Mobility/safety with mobility;Balance OT goals addressed during session: Strengthening/ROM;ADL's and self-care      AM-PAC PT 6 Clicks Mobility   Outcome Measure  Help needed turning from your back to your side while in a flat bed without using bedrails?: A Lot Help needed moving from lying on your back to sitting on the side of a flat bed without using bedrails?: A Lot Help needed moving to and from a bed to a chair (including a wheelchair)?: Total Help needed standing up from a chair using your arms (e.g., wheelchair or bedside chair)?: Total Help needed to walk in hospital room?: Total Help needed climbing 3-5 steps with a railing? : Total 6 Click Score: 8    End of Session Equipment Utilized During Treatment: Gait belt Activity Tolerance: Patient tolerated treatment well Patient left: in bed;with call bell/phone within reach;with bed alarm set;with family/visitor present Nurse Communication: Mobility status;Other (comment) PT Visit Diagnosis: Difficulty in walking, not elsewhere classified (R26.2);Muscle weakness (generalized) (M62.81)     Time: 9099-9082 PT Time Calculation (min) (ACUTE ONLY): 17 min  Charges:    $Therapeutic Activity: 8-22 mins PT General Charges $$ ACUTE PT VISIT: 1 Visit                   Lauraine Gills, PTA 03/15/23, 9:38 AM

## 2023-03-15 NOTE — Care Management Important Message (Signed)
 Important Message  Patient Details  Name: Elijah Murphy MRN: [id number] Date of Birth: 08/13/1955   Important Message Given:  Yes - Medicare IM     Cristela Blue, CMA 03/15/2023, 9:57 AM

## 2023-03-15 NOTE — Progress Notes (Signed)
 Initial Nutrition Assessment  DOCUMENTATION CODES:   Non-severe (moderate) malnutrition in context of social or environmental circumstances  INTERVENTION:   Vital Cuisine po TID, each supplement provides 520kcal and 22g of protein.   Magic cup TID with meals, each supplement provides 290 kcal and 9 grams of protein  MVI, folic acid  and thiamine  daily   Pt at high refeed risk; recommend monitor potassium, magnesium  and phosphorus labs daily until stable  Daily weights  Check vitamins B12 and D levels   NUTRITION DIAGNOSIS:   Moderate Malnutrition related to social / environmental circumstances as evidenced by moderate fat depletion, moderate muscle depletion, severe muscle depletion.  GOAL:   Patient will meet greater than or equal to 90% of their needs  MONITOR:   PO intake, Supplement acceptance, Diet advancement, Labs, Weight trends, Skin, I & O's  REASON FOR ASSESSMENT:   Consult Assessment of nutrition requirement/status  ASSESSMENT:   68 y/o male with h/o HTN, etoh abuse, HLD, B12 deficiency and cognitive impairment who is admitted with concerns for CVA.  Met with pt and pt's wife in room today. Pt is a poor historian so history provided by wife. Wife reports pt with some cognitive issues r/t his h/o etoh abuse. Wife reports pt with fairly good appetite and oral intake at baseline. Wife reports that she mainly cooks and prepares meals at home. Wife reports that patient was eating fairly well in hospital up until yesterday when he started vomiting. NGT in place to LIS with ~643ml out. Pt with intractable hiccups during RD visit; wife reports this has been going on for weeks. Pt denies any abdominal pain today. Pt reports his nausea is improved. Pt appears non-distended on exam today. Serial KUBs with no significant findings. Lipase is normal. NGT removed today and pt initiated back on a full liquid diet. Pt is documented to have eaten 50-100% of meals prior to being made  NPO. Pt seen by SLP and was placed on a nectar thick diet. Wife reports that pt has been having issues with swallowing pta; she reports liquids and foods getting stuck in pt's throat and reports that he has to really work to get things down sometimes. Wife reports that this has been going on since lengthy admission for urosepsis in 01/2022. RD discussed with pt the importance of adequate nutrition needed to preserve lean muscle. RD will add supplements to help pt meet his estimated needs (prefers chocolate). Pt is at high refeed risk.   Wife reports pt with frequent falls pta. RD will check vitamins B12 and D to r/o deficiency. Pt is receiving folic acid  and thiamine  in hospital.   Per chart, pt appears fairly weight stable at baseline. Pt reports his UBW is ~162lbs. Pt is currently documented to be up ~8lbs from his UBW.   Medications reviewed and include: aspirin , lovenox , folic acid , remeron , KCl, MVI, thiamine    Labs reviewed: K 3.3(L), P 3.0 wnl, Mg 2.1 wnl Lipase 27  NUTRITION - FOCUSED PHYSICAL EXAM:  Flowsheet Row Most Recent Value  Orbital Region Moderate depletion  Upper Arm Region Severe depletion  Thoracic and Lumbar Region Moderate depletion  Buccal Region Mild depletion  Temple Region Mild depletion  Clavicle Bone Region Moderate depletion  Clavicle and Acromion Bone Region Moderate depletion  Scapular Bone Region Moderate depletion  Dorsal Hand Moderate depletion  Patellar Region Severe depletion  Anterior Thigh Region Severe depletion  Posterior Calf Region Severe depletion  Edema (RD Assessment) None  Hair Reviewed  Eyes  Reviewed  Mouth Reviewed  Skin Reviewed  Nails Reviewed   Diet Order:   Diet Order             Diet full liquid Fluid consistency: Nectar Thick  Diet effective now                  EDUCATION NEEDS:   Education needs have been addressed  Skin:  Skin Assessment: Reviewed RN Assessment  Last BM:  1/9- type 4  Height:   Ht Readings  from Last 1 Encounters:  03/12/23 5' 9 (1.753 m)    Weight:   Wt Readings from Last 1 Encounters:  03/15/23 77.3 kg    Ideal Body Weight:  72.7 kg  BMI:  Body mass index is 25.18 kg/m.  Estimated Nutritional Needs:   Kcal:  2000-2300kcal/day  Protein:  100-115g/day  Fluid:  1.9-2.2L/day  Augustin Shams MS, RD, LDN If unable to be reached, please send secure chat to RD inpatient available from 8:00a-4:00p daily

## 2023-03-15 NOTE — TOC Progression Note (Signed)
 Transition of Care [facility]) - Progression Note    Patient Details  Name: YASSER HEPP MRN: [id number] Date of Birth: 1955-03-15  Transition of Care [facility]) CM/SW Contact  Ladene Lady, LCSW Phone Number: 03/15/2023, 11:54 AM  Clinical Narrative:    CSW spoke with wife about rehab she prefers anywhere in [place] besides [facility]. CSW has sent out referral.     Expected Discharge Plan: Home w Home Health Services Barriers to Discharge: Continued Medical Work up  Expected Discharge Plan and Services       Living arrangements for the past 2 months: Single Family Home                                       Social Determinants of Health (SDOH) Interventions SDOH Screenings   Food Insecurity: No Food Insecurity (03/12/2023)  Housing: Low Risk  (03/12/2023)  Transportation Needs: No Transportation Needs (03/12/2023)  Utilities: Not At Risk (03/12/2023)  Alcohol  Screen: Low Risk  (03/19/2019)  Depression (PHQ2-9): Low Risk  (03/19/2019)  Financial Resource Strain: Low Risk  (05/09/2022)   Received from [facility], [facility]  Social Connections: Socially Integrated (03/12/2023)  Tobacco Use: High Risk (03/12/2023)    Readmission Risk Interventions     No data to display

## 2023-03-15 NOTE — Progress Notes (Signed)
 Occupational Therapy Treatment Patient Details Name: Elijah Murphy MRN: [id number] DOB: 1955-05-26 Today's Date: 03/15/2023   History of present illness Pt is a 68 y.o. male with past medical history of HTN, HLD, mild dementia, and stroke who presents to the ED complaining of left eye blurry vision for 2 days with difficulty walking and falling to the left-hand side.  Per MRI impression No acute intracranial process. No evidence of acute or subacute  infarct.  MD assessement includes: HTN, ETOH abuse, and anemia.   OT comments  Mr Baig was seen for OT treatment on this date. Upon arrival to room pt in bed, agreeable to tx. Pt requires MOD A exit bed, L lateral lean in sitting pt self corrects with CGA decreasing to MOD A as pt fatigues. MOD A face washing in sitting, assist for sitting balance. MOD A x2 + RW sit>stand from bed decreasing to TOTAL A x2 to maintain upright posture as pt locks B knees and significant BLE ataxia noted. Educated family on safe d/c recs, agreeable to STR. Pt making progress toward goals, will continue to follow POC. Discharge recommendation remains appropriate.        If plan is discharge home, recommend the following:  Two people to help with walking and/or transfers;Two people to help with bathing/dressing/bathroom;Supervision due to cognitive status;Help with stairs or ramp for entrance   Equipment Recommendations  BSC/3in1;Hospital bed    Recommendations for Other Services      Precautions / Restrictions Precautions Precautions: Fall Restrictions Weight Bearing Restrictions Per Provider Order: No Other Position/Activity Restrictions: NG tube       Mobility Bed Mobility Overal bed mobility: Needs Assistance Bed Mobility: Supine to Sit, Sit to Supine     Supine to sit: Mod assist Sit to supine: Max assist, +2 for physical assistance        Transfers Overall transfer level: Needs assistance Equipment used: Rolling walker (2  wheels) Transfers: Sit to/from Stand Sit to Stand: Mod assist, +2 physical assistance                 Balance Overall balance assessment: Needs assistance Sitting-balance support: Bilateral upper extremity supported, Feet supported Sitting balance-Leahy Scale: Poor     Standing balance support: Bilateral upper extremity supported Standing balance-Leahy Scale: Zero                             ADL either performed or assessed with clinical judgement   ADL Overall ADL's : Needs assistance/impaired                                       General ADL Comments: MAX A don B socks in sitting. MOD A face washing in sitting, assist for sitting balance.      Cognition Arousal: Alert Behavior During Therapy: WFL for tasks assessed/performed Overall Cognitive Status: History of cognitive impairments - at baseline                                                     Pertinent Vitals/ Pain       Pain Assessment Pain Assessment: Faces Faces Pain Scale: Hurts little more Pain Location: abdomen Pain Descriptors / Indicators: Headache  Pain Intervention(s): Limited activity within patient's tolerance, Repositioned   Frequency  Min 1X/week        Progress Toward Goals  OT Goals(current goals can now be found in the care plan section)  Progress towards OT goals: Progressing toward goals  Acute Rehab OT Goals OT Goal Formulation: With patient/family Time For Goal Achievement: 03/26/23 Potential to Achieve Goals: Good ADL Goals Pt Will Perform Upper Body Dressing: sitting;with caregiver independent in assisting Pt Will Transfer to Toilet: ambulating;bedside commode Pt Will Perform Toileting - Clothing Manipulation and hygiene: with caregiver independent in assisting Additional ADL Goal #1: Pt/family will verbalize plan to implement at least 1 learned falls prevention strategy.  Plan      Co-evaluation    PT/OT/SLP  Co-Evaluation/Treatment: Yes Reason for Co-Treatment: Complexity of the patient's impairments (multi-system involvement) PT goals addressed during session: Mobility/safety with mobility;Balance OT goals addressed during session: Strengthening/ROM;ADL's and self-care      AM-PAC OT 6 Clicks Daily Activity     Outcome Measure   Help from another person eating meals?: None Help from another person taking care of personal grooming?: A Lot Help from another person toileting, which includes using toliet, bedpan, or urinal?: A Lot Help from another person bathing (including washing, rinsing, drying)?: A Lot Help from another person to put on and taking off regular upper body clothing?: A Lot Help from another person to put on and taking off regular lower body clothing?: A Lot 6 Click Score: 14    End of Session Equipment Utilized During Treatment: Rolling walker (2 wheels);Gait belt  OT Visit Diagnosis: Other abnormalities of gait and mobility (R26.89);Other symptoms and signs involving cognitive function;Muscle weakness (generalized) (M62.81)   Activity Tolerance Patient tolerated treatment well   Patient Left in bed;with call bell/phone within reach;with bed alarm set;with family/visitor present   Nurse Communication Mobility status        Time: 9143-9079 OT Time Calculation (min): 24 min  Charges: OT General Charges $OT Visit: 1 Visit OT Treatments $Self Care/Home Management : 8-22 mins  Elston Slot, M.S. OTR/L  03/15/23, 11:12 AM  ascom [phone]

## 2023-03-15 NOTE — Plan of Care (Signed)
  Problem: Education: Goal: Knowledge of disease or condition will improve Outcome: Progressing Goal: Knowledge of secondary prevention will improve (MUST DOCUMENT ALL) Outcome: Progressing Goal: Knowledge of patient specific risk factors will improve Alonso N/A or DELETE if not current risk factor) Outcome: Progressing   Problem: Ischemic Stroke/TIA Tissue Perfusion: Goal: Complications of ischemic stroke/TIA will be minimized Outcome: Progressing   Problem: Coping: Goal: Will verbalize positive feelings about self Outcome: Progressing Goal: Will identify appropriate support needs Outcome: Progressing   Problem: Health Behavior/Discharge Planning: Goal: Ability to manage health-related needs will improve Outcome: Progressing Goal: Goals will be collaboratively established with patient/family Outcome: Progressing   Problem: Education: Goal: Knowledge of General Education information will improve Description: Including pain rating scale, medication(s)/side effects and non-pharmacologic comfort measures Outcome: Progressing   Problem: Health Behavior/Discharge Planning: Goal: Ability to manage health-related needs will improve Outcome: Progressing   Problem: Clinical Measurements: Goal: Ability to maintain clinical measurements within normal limits will improve Outcome: Progressing Goal: Will remain free from infection Outcome: Progressing Goal: Diagnostic test results will improve Outcome: Progressing Goal: Respiratory complications will improve Outcome: Progressing Goal: Cardiovascular complication will be avoided Outcome: Progressing   Problem: Activity: Goal: Risk for activity intolerance will decrease Outcome: Progressing   Problem: Coping: Goal: Level of anxiety will decrease Outcome: Progressing   Problem: Elimination: Goal: Will not experience complications related to bowel motility Outcome: Progressing Goal: Will not experience complications related to  urinary retention Outcome: Progressing   Problem: Pain Management: Goal: General experience of comfort will improve Outcome: Progressing   Problem: Safety: Goal: Ability to remain free from injury will improve Outcome: Progressing   Problem: Skin Integrity: Goal: Risk for impaired skin integrity will decrease Outcome: Progressing

## 2023-03-15 NOTE — Progress Notes (Signed)
 PROGRESS NOTE    Elijah Murphy  FMW:[id number] DOB: 1955/05/22 DOA: 03/12/2023 PCP: Franchot Houston, PA-C    Brief Narrative:  68 y.o. male with medical history significant of alcohol  abuse, hypertension, hyperlipidemia, right eye blindness presenting with left-sided blurry vision and left-sided hemiparesis concerning for suspected CVA.  History from both patient and wife at the bedside.  Per report, patient went left eye blurry vision with past 2 days or so.  Baseline right eye blindness.  Per the wife, patient chronically drinks  6 ounces of bourbon daily though he is minimally ambulatory.  Wife states she gets alcohol  for him.  Had new onset left-sided weakness around 2 AM per the patient.  positive difficulty with ambulation.  found down on the floor.  Chronic slurred speech.  No chest pain or shortness of breath.  No reported change in alcohol  intake. Chronic alcohol  related neuropathy.  No fevers or chills.  No abdominal pain.  No reported diarrhea.  Does not take aspirin .    Assessment & Plan:   Principal Problem:   Suspected cerebrovascular accident (CVA) Active Problems:   Essential hypertension   Anemia   Alcohol  abuse   Unsteady gait   Ileus (HCC)    L sided weakness MRI brain negative for acute stroke.  CT head within normal limits Suspect transient weakness in the setting of alcohol  use Unable to exclude TIA Plan: Continue thiamine , folate, MVI Daily aspirin  81 mg Starting SNF bed search  Essential hypertension Suboptimal control Increase lisinopril  to 20 mg daily As needed IV hydralazine     ETOH abuse Patient regularly drinking at least 6 ounces of bourbon daily Longstanding issue Plan: Continue CIWA protocol  Possible ileus Patient with frequent belching and abdominal distention noted 1/9.  KUB negative for obstruction.  Responded to lactulose  enema.  Abdominal distention and belching improved.  NGT placed 1/9 Repeat KUB on 1/10 6 with normal bowel gas  pattern Plan: DC NGT Start full liquid diet Simethicone  as needed  H/o chronic dysphagia  Weak cough Poor oral intake.  Advance diet to full liquid today Engage RD Palliative following for goals of care Continue IVF for now  Mild thrombocytopenia  CTM   Elevated Tbili  Likely due to alcohol  use CTM    Hypokalemia Resolved    H/o Macrocytic anemia Currently Hgb within normal limits   DVT prophylaxis: SQ Lovenox  Code Status: Full Family Communication: None Disposition Plan: Status is: Inpatient Remains inpatient appropriate because: NGT removed today.  Remains on IV fluids.  Poor p.o. intake.  On liquid diet.  Possible medical readiness in 24 to 48 hours.   Level of care: Telemetry Medical  Consultants:  Palliative care  Procedures:  None  Antimicrobials: None   Subjective: Seen and examined.  Appears fatigued but otherwise stable.  No complaints.  Objective: Vitals:   03/15/23 0047 03/15/23 0414 03/15/23 0724 03/15/23 1204  BP: 131/77 (!) 161/99 (!) 165/85 (!) 147/95  Pulse: 69 74 71 78  Resp:  16 15 15   Temp:  98.3 F (36.8 C) 97.7 F (36.5 C) 97.9 F (36.6 C)  TempSrc:      SpO2: 93% 96% (!) 89% 94%  Weight:      Height:        Intake/Output Summary (Last 24 hours) at 03/15/2023 1232 Last data filed at 03/15/2023 0200 Gross per 24 hour  Intake 431.23 ml  Output 620 ml  Net -188.77 ml   Filed Weights   03/12/23 0902 03/12/23 2125  Weight: 73.5 kg 80.6 kg    Examination:  General exam: Appears fatigued Respiratory system: Bibasilar crackles.  Normal work of breathing.  Room air Cardiovascular system: S1-2, RRR, no murmurs, no pedal edema Gastrointestinal system: Soft, NT/ND, normal bowel sounds,+ NGT Central nervous system: Alert and oriented. No focal neurological deficits. Extremities: Symmetric 5 x 5 power. Skin: No rashes, lesions or ulcers Psychiatry: Judgement and insight appear impaired. Mood & affect flattened.     Data  Reviewed: I have personally reviewed following labs and imaging studies  CBC: Recent Labs  Lab 03/12/23 0902 03/14/23 0603  WBC 5.6 9.2  HGB 14.8 14.1  HCT 43.3 39.3  MCV 100.5* 96.3  PLT 149* 148*   Basic Metabolic Panel: Recent Labs  Lab 03/12/23 0902 03/13/23 1228 03/14/23 0603 03/15/23 0459  NA 138 138 136 141  K 4.7 3.3* 3.2* 3.3*  CL 103 101 100 106  CO2 21* 23 24 25   GLUCOSE 143* 137* 142* 119*  BUN 13 11 16 14   CREATININE 0.99 0.84 0.85 0.95  CALCIUM 8.7* 8.9 8.4* 8.4*  MG 1.7 1.8 1.7 2.1  PHOS  --  3.0  --  3.0   GFR: Estimated Creatinine Clearance: 75.5 mL/min (by C-G formula based on SCr of 0.95 mg/dL). Liver Function Tests: Recent Labs  Lab 03/12/23 0902  AST 35  ALT 30  ALKPHOS 47  BILITOT 1.5*  PROT 7.1  ALBUMIN 4.3   No results for input(s): LIPASE, AMYLASE in the last 168 hours. Recent Labs  Lab 03/12/23 2031  AMMONIA 12   Coagulation Profile: Recent Labs  Lab 03/12/23 0902  INR 1.1   Cardiac Enzymes: No results for input(s): CKTOTAL, CKMB, CKMBINDEX, TROPONINI in the last 168 hours. BNP (last 3 results) No results for input(s): PROBNP in the last 8760 hours. HbA1C: Recent Labs    03/12/23 1500  HGBA1C 5.0   CBG: No results for input(s): GLUCAP in the last 168 hours. Lipid Profile: Recent Labs    03/13/23 0437  CHOL 253*  HDL 86  LDLCALC 137*  TRIG 149  CHOLHDL 2.9   Thyroid Function Tests: No results for input(s): TSH, T4TOTAL, FREET4, T3FREE, THYROIDAB in the last 72 hours. Anemia Panel: No results for input(s): VITAMINB12, FOLATE, FERRITIN, TIBC, IRON, RETICCTPCT in the last 72 hours. Sepsis Labs: No results for input(s): PROCALCITON, LATICACIDVEN in the last 168 hours.  No results found for this or any previous visit (from the past 240 hours).       Radiology Studies: DG Abd 1 View Result Date: 03/15/2023 CLINICAL DATA:  68 year old male with ileus.  Neurologic  deficits. EXAM: ABDOMEN - 1 VIEW COMPARISON:  03/14/2023 and earlier. FINDINGS: Portable AP supine views at 1100 hours. Negative lung bases. Satisfactory enteric tube placement into the stomach, side hole at the gastric body level. Non obstructed bowel gas pattern. Abdominal and pelvic visceral contours within normal limits. No acute osseous abnormality identified. IMPRESSION: 1. Satisfactory enteric tube placement in the stomach. 2. Normal bowel gas pattern. Electronically Signed   By: VEAR Hurst M.D.   On: 03/15/2023 12:12   DG Abd 1 View Result Date: 03/14/2023 CLINICAL DATA:  Check gastric catheter placement EXAM: ABDOMEN - 1 VIEW COMPARISON:  Film from earlier in the same day FINDINGS: Gastric catheter now lies within the stomach. Proximal side port is in the stomach as well. IMPRESSION: Gastric catheter deeper within the stomach in satisfactory position. Electronically Signed   By: Oneil Devonshire M.D.   On:  03/14/2023 19:57   DG Abd 1 View Result Date: 03/14/2023 CLINICAL DATA:  NG tube placement EXAM: ABDOMEN - 1 VIEW limited for tube placement COMPARISON:  03/14/2023 earlier FINDINGS: Interval placement of enteric tube. Tip overlies the midbody of the stomach. Side hole near the GE junction. This could be advanced further into the stomach. Elsewhere there are some air-filled loops of bowel in the upper abdomen. Films are under penetrated. Overlapping cardiac leads. IMPRESSION: Limited x-ray for tube placement has tip overlying the midbody of the stomach. Side-hole at the GE junction. Recommend further advancement into the stomach Electronically Signed   By: Ranell Bring M.D.   On: 03/14/2023 18:28   DG Abd 1 View Result Date: 03/14/2023 CLINICAL DATA:  Abdominal distension EXAM: ABDOMEN - 1 VIEW COMPARISON:  10/17/2019 FINDINGS: Scattered large and small bowel gas is noted. No obstructive changes are seen. No free air is noted. Bony structures are unremarkable. No abnormal mass is seen. IMPRESSION: No acute  abnormality noted. Electronically Signed   By: Oneil Devonshire M.D.   On: 03/14/2023 17:21        Scheduled Meds:  aspirin   81 mg Oral Daily   Chlorhexidine  Gluconate Cloth  6 each Topical Daily   enoxaparin  (LOVENOX ) injection  40 mg Subcutaneous Q24H   feeding supplement (NEPRO CARB STEADY)  237 mL Oral TID BM   folic acid   1 mg Oral Daily   lisinopril   10 mg Oral Daily   mirtazapine   15 mg Oral QHS   multivitamin with minerals  1 tablet Oral Daily   naltrexone   50 mg Oral Daily   potassium chloride   40 mEq Oral Once   thiamine   100 mg Oral Daily   Or   thiamine   100 mg Intravenous Daily   Continuous Infusions:  dextrose  5 % and 0.45 % NaCl 75 mL/hr at 03/15/23 0149   promethazine  (PHENERGAN ) injection (IM or IVPB) 12.5 mg (03/14/23 1354)     LOS: 3 days    Calvin KATHEE Robson, MD [facility]   If 7PM-7AM, please contact night-coverage  03/15/2023, 12:32 PM

## 2023-03-15 NOTE — NC FL2 (Signed)
 [place]  MEDICAID FL2 LEVEL OF CARE FORM     IDENTIFICATION  Patient Name: NORVEL WENKER Birthdate: 30-Nov-1955 Sex: male Admission Date (Current Location): 03/12/2023  [facility] [place] Number:  Chiropodist and Address:  [facility], [address], [place], [place] [address]      Provider Number: [id number]  Attending Physician Name and Address:  Jhonny Calvin NOVAK, MD  Relative Name and Phone Number:  ARIANA, CAVENAUGH (Spouse)  [phone]    Current Level of Care: Hospital Recommended Level of Care: Skilled Nursing Facility Prior Approval Number:    Date Approved/Denied:   PASRR Number: [id number]  Discharge Plan: SNF    Current Diagnoses: Patient Active Problem List   Diagnosis Date Noted   Unsteady gait 03/14/2023   Ileus (HCC) 03/14/2023   Suspected cerebrovascular accident (CVA) 03/12/2023   Alcohol  abuse 03/12/2023   Alcoholic ketoacidosis 12/05/2022   Hyponatremia 12/05/2022   PSA elevation 01/30/2022   Severe sepsis (HCC) 01/30/2022   Bacterial infection due to E. 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   Hematemesis with nausea    Diarrhea    Acute gastroenteritis    Anemia    Sepsis secondary to UTI (HCC) 02/05/2020   AKI (acute kidney injury) (HCC) 02/05/2020   Lactic acidosis 02/05/2020   Acute metabolic encephalopathy 02/05/2020   History of UTI 02/05/2020   Essential hypertension 08/10/2016    Orientation RESPIRATION BLADDER Height & Weight     Self, Situation, Place  Normal Incontinent Weight: 177 lb 11.1 oz (80.6 kg) Height:  5' 9 (175.3 cm)  BEHAVIORAL SYMPTOMS/MOOD NEUROLOGICAL BOWEL NUTRITION STATUS      Incontinent    AMBULATORY STATUS  COMMUNICATION OF NEEDS Skin     Verbally Normal                       Personal Care Assistance Level of Assistance              Functional Limitations Info  Sight, Hearing, Speech Sight Info: Impaired Hearing Info: Adequate Speech Info: Adequate    SPECIAL CARE FACTORS FREQUENCY  PT (By licensed PT), OT (By licensed OT)     PT Frequency: 5 times a week OT Frequency: 5 times a week            Contractures Contractures Info: Not present    Additional Factors Info  Code Status, Allergies Code Status Info: FULL Allergies Info: Sulfa antibiotics           Current Medications (03/15/2023):  This is the current hospital active medication list Current Facility-Administered Medications  Medication Dose Route Frequency Provider Last Rate Last Admin   acetaminophen  (TYLENOL ) tablet 650 mg  650 mg Oral Q4H PRN Newton, Steven J, MD       Or   acetaminophen  (TYLENOL ) 160 MG/5ML solution 650 mg  650 mg Per Tube Q4H PRN Newton, Steven J, MD       Or   acetaminophen  (TYLENOL ) suppository 650 mg  650 mg Rectal Q4H PRN Newton, Steven J, MD   650 mg at 03/12/23 2318   aspirin  chewable tablet 81 mg  81 mg Oral Daily Merrill, Kristin A, RPH   81 mg at 03/14/23  0900   Chlorhexidine  Gluconate Cloth 2 % PADS 6 each  6 each Topical Daily Franchot Novel, MD   6 each at 03/15/23 1021   dextrose  5 % and 0.45 % NaCl infusion   Intravenous Continuous Franchot Novel, MD 75 mL/hr at 03/15/23 0149 New Bag at 03/15/23 0149   enoxaparin  (LOVENOX ) injection 40 mg  40 mg Subcutaneous Q24H Eldonna Elspeth PARAS, MD   40 mg at 03/14/23 2347   feeding supplement (NEPRO CARB STEADY) liquid 237 mL  237 mL Oral TID BM Franchot Novel, MD       folic acid  (FOLVITE ) tablet 1 mg  1 mg Oral Daily Eldonna Elspeth PARAS, MD   1 mg at 03/14/23 0900   labetalol  (NORMODYNE ) injection 10 mg  10 mg Intravenous Q2H PRN Newton, Steven J, MD   10 mg at 03/14/23 2349   lisinopril  (ZESTRIL ) tablet 10 mg  10 mg Oral  Daily Franchot Novel, MD   10 mg at 03/14/23 9140   mirtazapine  (REMERON  SOL-TAB) disintegrating tablet 15 mg  15 mg Oral QHS Franchot Novel, MD   15 mg at 03/14/23 2347   multivitamin with minerals tablet 1 tablet  1 tablet Oral Daily Eldonna Elspeth PARAS, MD   1 tablet at 03/14/23 0900   naltrexone  (DEPADE) tablet 50 mg  50 mg Oral Daily Franchot Novel, MD   50 mg at 03/14/23 0901   ondansetron  (ZOFRAN ) injection 4 mg  4 mg Intravenous Q6H PRN Newton, Steven J, MD   4 mg at 03/14/23 1922   potassium chloride  (KLOR-CON ) packet 40 mEq  40 mEq Oral Once Jhonny Sahara B, MD       promethazine  (PHENERGAN ) 12.5 mg in sodium chloride  0.9 % 50 mL IVPB  12.5 mg Intravenous Q6H PRN Eldonna Elspeth PARAS, MD 150 mL/hr at 03/14/23 1354 12.5 mg at 03/14/23 1354   senna-docusate (Senokot-S) tablet 1 tablet  1 tablet Oral QHS PRN Eldonna Elspeth PARAS, MD       thiamine  (VITAMIN B1) tablet 100 mg  100 mg Oral Daily Eldonna Elspeth PARAS, MD   100 mg at 03/14/23 0900   Or   thiamine  (VITAMIN B1) injection 100 mg  100 mg Intravenous Daily Eldonna Elspeth PARAS, MD   100 mg at 03/15/23 1047     Discharge Medications: Please see discharge summary for a list of discharge medications.  Relevant Imaging Results:  Relevant Lab Results:   Additional Information [id number]  Ladene Lady, LCSW

## 2023-03-15 NOTE — Progress Notes (Addendum)
 SLP Cancellation Note  Patient Details Name: Elijah Murphy MRN: [id number] DOB: 10-15-1955   Cancelled treatment:       Reason Eval/Treat Not Completed: Medical issues which prohibited therapy (chart reviewed; consulted MD and met w/ Wife/pt.)  Met w/ pt/Wife in room. Pt w/ concern for Ileus - NGT placed 1/9 to suction. Pt w/ ongoing hiccups intermittently and overall discomfort. Mildly wet vocal quality and throat clearing noted at rest during hiccups.  Discussed w/ Wife general oral care for hygiene and stimulation of swallowing while NPO. MD to determine when to advance diet (Nectar consistency liquids d/t Baseline dysphagia concerns, chronic presentation of such prior to admit); aspiration precautions as well. Wife agreed. Swabs left in room.  ST services will f/u next 1-2 days w/ pt's status; ongoing dysphagia tx per POC.     Comer Portugal, MS, CCC-SLP Speech Language Pathologist Rehab Services; [facility] [phone] (ascom) Marybella Ethier 03/15/2023, 12:49 PM

## 2023-03-15 NOTE — Consult Note (Addendum)
 Consultation Note Date: 03/15/2023   Patient Name: Elijah Murphy  DOB: 1955-07-28  MRN: [id number]  Age / Sex: 68 y.o., male  PCP: Franchot Houston, PA-C Referring Physician: Jhonny Calvin NOVAK, MD  Reason for Consultation: Establishing goals of care  HPI/Patient Profile: Per H&P, 68 y.o. male with medical history significant of alcohol  abuse, hypertension, hyperlipidemia, right eye blindness presenting with left-sided blurry vision and left-sided hemiparesis concerning for suspected CVA.  History from both patient and wife at the bedside.  Per report, patient went left eye blurry vision with past 2 days or so.  Baseline right eye blindness.  Per the wife, patient chronically drinks  6 ounces of bourbon daily though he is minimally ambulatory.  Wife states she gets alcohol  for him.  Had new onset left-sided weakness around 2 AM per the patient.  positive difficulty with ambulation.  found down on the floor.  Chronic slurred speech.  No chest pain or shortness of breath.  No reported change in alcohol  intake. Chronic alcohol  related neuropathy.  No fevers or chills.  No abdominal pain.  No reported diarrhea.  Does not take aspirin .   Clinical Assessment and Goals of Care: Notes and labs reviewed.  In to speak with patient.  His wife is currently at bedside.  Wife discusses that they live at home alone.  She states he abstained from alcohol  for a long period of time before beginning to drink bourbon again.  She discusses the current hospitalization.  He has a moist and weak cough that wife states is his baseline.   We discussed his diagnoses, prognosis, GOC, EOL wishes disposition and options.  Created space and opportunity for patient  to explore thoughts and feelings regarding current medical information.   A detailed discussion was had today regarding advanced directives.  Concepts specific to code status,  artifical feeding and hydration, IV antibiotics and rehospitalization were discussed.  The difference between an aggressive medical intervention path and a comfort care path was discussed.  Values and goals of care important to patient and family were attempted to be elicited.  Discussed limitations of medical interventions to prolong quality of life in some situations and discussed the concept of human mortality.  Patient states very clearly that he does not want CPR.  He states he would not want to be placed on a ventilator.  He states he would never want to return to the hospital, and does not want to go to doctors appointments after this admission.  He states he does not want to take home medications.  He is unsure if he would want a feeding tube or not.  We discussed how all of the planning relates to each other, but he states he still needs to consider this.  He is aware that he is hospice at home appropriate if he chooses.  I completed a MOST form today with wife at bedside and the signed original was placed in the chart. Each section of options on the form were reviewed in full detail and  any questions were answered as needed. The form was scanned and sent to medical records for it to be uploaded under ACP tab in Epic. A photocopy was also placed in the chart to be scanned into EMR. The patient outlined their wishes for the following treatment decisions:  Cardiopulmonary Resuscitation: Do Not Attempt Resuscitation (DNR/No CPR)  Medical Interventions: Limited Additional Interventions: Use medical treatment, IV fluids and cardiac monitoring as indicated, DO NOT USE intubation or mechanical ventilation. May consider use of less invasive airway support such as BiPAP or CPAP. Also provide comfort measures. Transfer to the hospital if indicated. Avoid intensive care.   Antibiotics: Antibiotics if indicated  IV Fluids: Left Blank  Feeding Tube: Left Blank       SUMMARY OF RECOMMENDATIONS   Patient  is aware he is a candidate for home with hospice if he chooses this route.  At this time he would like DNR/DNI status.  He is unsure if he would want a feeding tube.  He states he and his wife will talk further.        Primary Diagnoses: Present on Admission:  Suspected cerebrovascular accident (CVA)  Essential hypertension  Anemia   I have reviewed the medical record, interviewed the patient and family, and examined the patient. The following aspects are pertinent.  Past Medical History:  Diagnosis Date   E. coli infection    Hyperlipidemia 08/10/2016   Hypertension    Social History   Socioeconomic History   Marital status: Married    Spouse name: Not on file   Number of children: Not on file   Years of education: Not on file   Highest education level: Not on file  Occupational History   Not on file  Tobacco Use   Smoking status: Never   Smokeless tobacco: Current    Types: Chew  Vaping Use   Vaping status: Never Used  Substance and Sexual Activity   Alcohol  use: Yes    Comment: occasionally   Drug use: No   Sexual activity: Not on file  Other Topics Concern   Not on file  Social History Narrative   Not on file   Social Drivers of Health   Financial Resource Strain: Low Risk  (05/09/2022)   [facility] from [facility], [facility]   Overall Financial Resource Strain (CARDIA)    Difficulty of Paying Living Expenses: Not hard at all  Food Insecurity: No Food Insecurity (03/12/2023)   Hunger Vital Sign    Worried About Running Out of Food in the Last Year: Never true    Ran Out of Food in the Last Year: Never true  Transportation Needs: No Transportation Needs (03/12/2023)   PRAPARE - Administrator, Civil Service (Medical): No    Lack of Transportation (Non-Medical): No  Physical Activity: Not on file  Stress: Not on file  Social Connections: Socially Integrated (03/12/2023)   Social Connection and Isolation Panel  [NHANES]    Frequency of Communication with Friends and Family: More than three times a week    Frequency of Social Gatherings with Friends and Family: More than three times a week    Attends Religious Services: More than 4 times per year    Active Member of [employer] or Organizations: Yes    Attends Engineer, Structural: More than 4 times per year    Marital Status: Married   History reviewed. No pertinent family history. Scheduled Meds:  aspirin   81 mg  Oral Daily   Chlorhexidine  Gluconate Cloth  6 each Topical Daily   enoxaparin  (LOVENOX ) injection  40 mg Subcutaneous Q24H   folic acid   1 mg Oral Daily   [START ON 03/16/2023] lisinopril   20 mg Oral Daily   mirtazapine   15 mg Oral QHS   multivitamin with minerals  1 tablet Oral Daily   naltrexone   50 mg Oral Daily   potassium chloride   40 mEq Oral Once   thiamine   100 mg Oral Daily   Or   thiamine   100 mg Intravenous Daily   Continuous Infusions:  promethazine  (PHENERGAN ) injection (IM or IVPB) 12.5 mg (03/14/23 1354)   PRN Meds:.acetaminophen  **OR** acetaminophen  (TYLENOL ) oral liquid 160 mg/5 mL **OR** acetaminophen , chlorproMAZINE , hydrALAZINE , labetalol , ondansetron  (ZOFRAN ) IV, promethazine  (PHENERGAN ) injection (IM or IVPB), senna-docusate, simethicone  Medications Prior to Admission:  Prior to Admission medications   Medication Sig Start Date End Date Taking? Authorizing Provider  gabapentin (NEURONTIN) 100 MG capsule Take 200 mg by mouth 3 (three) times daily as needed. 07/31/22 07/31/23 Yes [provider]  mirtazapine  (REMERON  SOL-TAB) 15 MG disintegrating tablet Take 1 tablet (15 mg total) by mouth at bedtime. 12/06/22 03/12/23 Yes Sreenath, Sudheer B, MD  ondansetron  (ZOFRAN -ODT) 4 MG disintegrating tablet Take 1 tablet (4 mg total) by mouth every 8 (eight) hours as needed for nausea or vomiting. 12/06/22  Yes Sreenath, Sudheer B, MD  silodosin  (RAPAFLO ) 4 MG CAPS capsule Take 4 mg by mouth daily with breakfast.  Not taking   Yes [provider]  cyanocobalamin 1000 MCG tablet Take 1,000 mcg by mouth daily. Not taking    [provider]  lisinopril  (ZESTRIL ) 20 MG tablet Take 20 mg by mouth daily. Not taking    [provider]  rosuvastatin (CRESTOR) 20 MG tablet Take 20 mg by mouth daily. Not taking    [provider]  sertraline  (ZOLOFT ) 25 MG tablet Take 25 mg by mouth daily. Not taking    [provider]   Allergies  Allergen Reactions   Sulfa Antibiotics Hives   Review of Systems  Respiratory:  Positive for cough.     Physical Exam Pulmonary:     Effort: Pulmonary effort is normal.  Neurological:     Mental Status: He is alert.     Vital Signs: BP (!) 147/95 (BP Location: Left Arm)   Pulse 78   Temp 97.9 F (36.6 C)   Resp 15   Ht 5' 9 (1.753 m)   Wt 80.6 kg   SpO2 94%   BMI 26.24 kg/m  Pain Scale: 0-10   Pain Score: 0-No pain   SpO2: SpO2: 94 % O2 Device:SpO2: 94 % O2 Flow Rate: .   IO: Intake/output summary:  Intake/Output Summary (Last 24 hours) at 03/15/2023 1449 Last data filed at 03/15/2023 0200 Gross per 24 hour  Intake 431.23 ml  Output 620 ml  Net -188.77 ml    LBM: Last BM Date : 03/14/23 (Per patient chart. See output.) Baseline Weight: Weight: 73.5 kg Most recent weight: Weight: 80.6 kg        Signed by: Camelia Lewis, NP   Please contact Palliative Medicine Team phone at [phone] for questions and concerns.  For individual provider: See Tracey

## 2023-03-16 DIAGNOSIS — E440 Moderate protein-calorie malnutrition: Secondary | ICD-10-CM | POA: Insufficient documentation

## 2023-03-16 DIAGNOSIS — R0989 Other specified symptoms and signs involving the circulatory and respiratory systems: Secondary | ICD-10-CM | POA: Diagnosis not present

## 2023-03-16 LAB — BASIC METABOLIC PANEL
Anion gap: 9 (ref 5–15)
BUN: 14 mg/dL (ref 8–23)
CO2: 24 mmol/L (ref 22–32)
Calcium: 8.3 mg/dL — ABNORMAL LOW (ref 8.9–10.3)
Chloride: 107 mmol/L (ref 98–111)
Creatinine, Ser: 0.9 mg/dL (ref 0.61–1.24)
GFR, Estimated: >60 mL/min (ref >60)
Glucose, Bld: 114 mg/dL — ABNORMAL HIGH (ref 70–99)
Potassium: 3 mmol/L — ABNORMAL LOW (ref 3.5–5.1)
Sodium: 140 mmol/L (ref 135–145)

## 2023-03-16 LAB — VITAMIN D 25 HYDROXY (VIT D DEFICIENCY, FRACTURES): Vit D, 25-Hydroxy: 40.52 ng/mL (ref 30–100)

## 2023-03-16 LAB — MAGNESIUM: Magnesium: 1.9 mg/dL (ref 1.7–2.4)

## 2023-03-16 LAB — VITAMIN B12: Vitamin B-12: 269 pg/mL (ref 180–914)

## 2023-03-16 LAB — PHOSPHORUS: Phosphorus: 3 mg/dL (ref 2.5–4.6)

## 2023-03-16 MED ORDER — POTASSIUM CHLORIDE 10 MEQ/100ML IV SOLN
10.0000 meq | INTRAVENOUS | Status: AC
  Administered 2023-03-16 (×2): 10 meq via INTRAVENOUS
  Filled 2023-03-16: qty 100

## 2023-03-16 NOTE — Progress Notes (Signed)
 PROGRESS NOTE    Elijah Murphy  FMW:[id number] DOB: [birth year] DOA: 03/12/2023 PCP: Franchot Houston, PA-C    Brief Narrative:  68 y.o. male with medical history significant of alcohol  abuse, hypertension, hyperlipidemia, right eye blindness presenting with left-sided blurry vision and left-sided hemiparesis concerning for suspected CVA.  History from both patient and wife at the bedside.  Per report, patient went left eye blurry vision with past 2 days or so.  Baseline right eye blindness.  Per the wife, patient chronically drinks  6 ounces of bourbon daily though he is minimally ambulatory.  Wife states she gets alcohol  for him.  Had new onset left-sided weakness around 2 AM per the patient.  positive difficulty with ambulation.  found down on the floor.  Chronic slurred speech.  No chest pain or shortness of breath.  No reported change in alcohol  intake. Chronic alcohol  related neuropathy.  No fevers or chills.  No abdominal pain.  No reported diarrhea.  Does not take aspirin .    Assessment & Plan:   Principal Problem:   Suspected cerebrovascular accident (CVA) Active Problems:   Essential hypertension   Anemia   Alcohol  abuse   Unsteady gait   Ileus (HCC)   Malnutrition of moderate degree    L sided weakness MRI brain negative for acute stroke.  CT head within normal limits Suspect transient weakness in the setting of alcohol  use Unable to exclude TIA Plan: Continue thiamine , folate, MVI Daily aspirin  81 mg Starting SNF bed search  Essential hypertension Suboptimal control Increase lisinopril  to 20 mg daily As needed IV hydralazine     ETOH abuse Patient regularly drinking at least 6 ounces of bourbon daily Longstanding issue Plan: No signs of withdrawal  Possible ileus, ruled out Patient with frequent belching and abdominal distention noted 1/9.  KUB negative for obstruction.  Responded to lactulose  enema.  Abdominal distention and belching improved.  NGT placed  1/9 Repeat KUB on 1/10 6 with normal bowel gas pattern Plan: Patient's oral intake remains poor.  Had a lengthy discussion with patient and wife at bedside on 1/11.  They would like if possible to bring patient home with hospice services.  Palliative care, TOC engaged.  H/o chronic dysphagia  Weak cough Poor oral intake.  Unclear nature of patient's poor oral intake.  I suspect this is more functional in nature rather than mechanical or physiologic.  Patient has no desire to return to the hospital.  Does not want to take medications.  Palliative care following.  Will attempt to engage hospice services 1/11.  Mild thrombocytopenia  CTM   Elevated Tbili  Likely due to alcohol  use CTM    Hypokalemia Resolved    H/o Macrocytic anemia Currently Hgb within normal limits   DVT prophylaxis: SQ Lovenox  Code Status: Full Family Communication: None Disposition Plan: Status is: Inpatient Remains inpatient appropriate because: NGT removed today.  Remains on IV fluids.  Poor p.o. intake.  On liquid diet.  Possible medical readiness in 24 to 48 hours.   Level of care: Telemetry Medical  Consultants:  Palliative care  Procedures:  None  Antimicrobials: None   Subjective: Seen and examined.  Appears weak and fatigued.  Objective: Vitals:   03/15/23 1525 03/15/23 1947 03/16/23 0600 03/16/23 0837  BP: (!) 146/94 (!) 175/98 107/87 (!) 113/90  Pulse: 84 84 85 87  Resp: 20 18 19 15   Temp: 98.3 F (36.8 C) 98.3 F (36.8 C) 97.7 F (36.5 C) 98.1 F (36.7 C)  TempSrc:  Oral     SpO2: 96% 95% 92% 95%  Weight:      Height:        Intake/Output Summary (Last 24 hours) at 03/16/2023 1259 Last data filed at 03/15/2023 2000 Gross per 24 hour  Intake --  Output 525 ml  Net -525 ml   Filed Weights   03/12/23 0902 03/12/23 2125 03/15/23 1508  Weight: 73.5 kg 80.6 kg 77.3 kg    Examination:  General exam: Ill-appearing.  Appears fatigued Respiratory system: Bibasilar crackles.   Normal work of breathing.  Room air Cardiovascular system: S1-2, RRR, no murmurs, no pedal edema Gastrointestinal system: Soft, NT/ND, normal bowel sounds,+ NGT Central nervous system: Alert but lethargic.  Oriented x 2.  No focal deficits Extremities: Symmetric 5 x 5 power. Skin: No rashes, lesions or ulcers Psychiatry: Judgement and insight appear impaired. Mood & affect flattened.     Data Reviewed: I have personally reviewed following labs and imaging studies  CBC: Recent Labs  Lab 03/12/23 0902 03/14/23 0603  WBC 5.6 9.2  HGB 14.8 14.1  HCT 43.3 39.3  MCV 100.5* 96.3  PLT 149* 148*   Basic Metabolic Panel: Recent Labs  Lab 03/12/23 0902 03/13/23 1228 03/14/23 0603 03/15/23 0459 03/16/23 0447  NA 138 138 136 141 140  K 4.7 3.3* 3.2* 3.3* 3.0*  CL 103 101 100 106 107  CO2 21* 23 24 25 24   GLUCOSE 143* 137* 142* 119* 114*  BUN 13 11 16 14 14   CREATININE 0.99 0.84 0.85 0.95 0.90  CALCIUM 8.7* 8.9 8.4* 8.4* 8.3*  MG 1.7 1.8 1.7 2.1 1.9  PHOS  --  3.0  --  3.0 3.0   GFR: Estimated Creatinine Clearance: 79.6 mL/min (by C-G formula based on SCr of 0.9 mg/dL). Liver Function Tests: Recent Labs  Lab 03/12/23 0902  AST 35  ALT 30  ALKPHOS 47  BILITOT 1.5*  PROT 7.1  ALBUMIN 4.3   Recent Labs  Lab 03/15/23 0459  LIPASE 27   Recent Labs  Lab 03/12/23 2031  AMMONIA 12   Coagulation Profile: Recent Labs  Lab 03/12/23 0902  INR 1.1   Cardiac Enzymes: No results for input(s): CKTOTAL, CKMB, CKMBINDEX, TROPONINI in the last 168 hours. BNP (last 3 results) No results for input(s): PROBNP in the last 8760 hours. HbA1C: No results for input(s): HGBA1C in the last 72 hours.  CBG: No results for input(s): GLUCAP in the last 168 hours. Lipid Profile: No results for input(s): CHOL, HDL, LDLCALC, TRIG, CHOLHDL, LDLDIRECT in the last 72 hours.  Thyroid Function Tests: No results for input(s): TSH, T4TOTAL, FREET4,  T3FREE, THYROIDAB in the last 72 hours. Anemia Panel: No results for input(s): VITAMINB12, FOLATE, FERRITIN, TIBC, IRON, RETICCTPCT in the last 72 hours. Sepsis Labs: No results for input(s): PROCALCITON, LATICACIDVEN in the last 168 hours.  No results found for this or any previous visit (from the past 240 hours).       Radiology Studies: DG Abd 1 View Result Date: 03/15/2023 CLINICAL DATA:  68 year old male with ileus.  Neurologic deficits. EXAM: ABDOMEN - 1 VIEW COMPARISON:  03/14/2023 and earlier. FINDINGS: Portable AP supine views at 1100 hours. Negative lung bases. Satisfactory enteric tube placement into the stomach, side hole at the gastric body level. Non obstructed bowel gas pattern. Abdominal and pelvic visceral contours within normal limits. No acute osseous abnormality identified. IMPRESSION: 1. Satisfactory enteric tube placement in the stomach. 2. Normal bowel gas pattern. Electronically Signed   By:  VEAR Hurst M.D.   On: 03/15/2023 12:12   DG Abd 1 View Result Date: 03/14/2023 CLINICAL DATA:  Check gastric catheter placement EXAM: ABDOMEN - 1 VIEW COMPARISON:  Film from earlier in the same day FINDINGS: Gastric catheter now lies within the stomach. Proximal side port is in the stomach as well. IMPRESSION: Gastric catheter deeper within the stomach in satisfactory position. Electronically Signed   By: Oneil Devonshire M.D.   On: 03/14/2023 19:57   DG Abd 1 View Result Date: 03/14/2023 CLINICAL DATA:  NG tube placement EXAM: ABDOMEN - 1 VIEW limited for tube placement COMPARISON:  03/14/2023 earlier FINDINGS: Interval placement of enteric tube. Tip overlies the midbody of the stomach. Side hole near the GE junction. This could be advanced further into the stomach. Elsewhere there are some air-filled loops of bowel in the upper abdomen. Films are under penetrated. Overlapping cardiac leads. IMPRESSION: Limited x-ray for tube placement has tip overlying the midbody of the  stomach. Side-hole at the GE junction. Recommend further advancement into the stomach Electronically Signed   By: Ranell Bring M.D.   On: 03/14/2023 18:28   DG Abd 1 View Result Date: 03/14/2023 CLINICAL DATA:  Abdominal distension EXAM: ABDOMEN - 1 VIEW COMPARISON:  10/17/2019 FINDINGS: Scattered large and small bowel gas is noted. No obstructive changes are seen. No free air is noted. Bony structures are unremarkable. No abnormal mass is seen. IMPRESSION: No acute abnormality noted. Electronically Signed   By: Oneil Devonshire M.D.   On: 03/14/2023 17:21        Scheduled Meds:  aspirin   81 mg Oral Daily   Chlorhexidine  Gluconate Cloth  6 each Topical Daily   enoxaparin  (LOVENOX ) injection  40 mg Subcutaneous Q24H   folic acid   1 mg Oral Daily   lisinopril   20 mg Oral Daily   mirtazapine   15 mg Oral QHS   multivitamin with minerals  1 tablet Oral Daily   naltrexone   50 mg Oral Daily   thiamine   100 mg Oral Daily   Or   thiamine   100 mg Intravenous Daily   Continuous Infusions:  dextrose  5 % and 0.45 % NaCl with KCl 20 mEq/L 40 mL/hr at 03/16/23 0235   potassium chloride  Stopped (03/16/23 1231)   promethazine  (PHENERGAN ) injection (IM or IVPB) 12.5 mg (03/14/23 1354)     LOS: 4 days    Calvin KATHEE Robson, MD [facility]   If 7PM-7AM, please contact night-coverage  03/16/2023, 12:59 PM

## 2023-03-16 NOTE — Progress Notes (Signed)
 Speech Language Pathology Treatment: Dysphagia  Patient Details Name: Elijah Murphy MRN: [id number] DOB: February 03, 1956 Today's Date: 03/16/2023 Time: 9164-9094 SLP Time Calculation (min) (ACUTE ONLY): 30 min  Assessment / Plan / Recommendation Clinical Impression  Pt seen for follow up dysphagia intervention secondary to RN concern for difficulty swallowing. Pt with wet vocal quality upon therapist entrance to room and persistent hiccups. Pt seen with nectar thick liquid trials via cup, without overt increase in wet vocal quality, and no cough/throat clear. Solids deferred secondary to report of emesis with solids, refusal by pt, and pt's increased lethargy by end of session. Spouse present during session with questions regarding rationale for nectar thick liquids, wet vocal quality/congestion, meal time goals (regarding what to order/how much to give pt). All questions answered, with emphasis on monitoring endurance for intake and comfort with solids in terms of meal completion. Recommended trials of snacks (as pt is able/awake) throughout day. Questions regarding feeding tube were deferred to MD and dietician. Aspiration precaution reinforced and education shared regarding importance of oral care. Spouse endorsed understanding.   Pt remains at risk for aspiration based on history of dysphagia, dementia, and esophageal concerns with current low endurance. Therefore recommend continuation of nectar thick liquids with close monitoring of respiratory/pulmonary status. Solids recommendation limited secondary to reported emesis/pt refusing trials during session; therefore, defer to medical team. SLP will continue to follow per POC.    HPI HPI: Per ED H&P and chart notes, pt is a 68 y.o. male with medical history significant of Alcohol  abuse, Mild Dementia, hypertension, UTI symptoms, hyperlipidemia, right eye blindness presenting with left-sided blurry vision and left-sided hemiparesis, concerning s/s of  CVA.  History from both patient and wife at the bedside.  Per report, patient went left eye blurry vision with past 2 days or so.  Baseline right eye blindness.  Per the wife, patient chronically drinks only 6 ounces of bourbon daily though he is minimally ambulatory.  Wife states she gets the alcohol  for him.  Had new onset left-sided weakness around 2 AM per the patient, positive difficulty with ambulation and found down on the floor.  Chronic slurred speech.  Chronic alcohol  related neuropathy.  No chest pain or shortness of breath.  Of Note: h/o GI/Esophageal issues in 10/2019 and 02/2020 c/b circumferential wall thickening of the Esophagus which may be  secondary to Esophagitis. Consider evaluation with upper endoscopy per Abd scan. Pt did not f/u w/ GI then.  Unable to urinate post admit; obtained from straight cath by NSG.   IMAGING: MRI- No acute intracranial process. No evidence of acute or subacute  infarct.  2. Loss of the left vertebral artery void, which is nonspecific but  can be seen in the setting of vertebral artery occlusion or slow flow.  Confluent T2  hyperintense signal in the periventricular white matter, likely the  sequela of moderate chronic small vessel ischemic disease. 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  Pt dips snuff per Wife.    Interval History: Repeat KUB on 1/10 6 with normal bowel gas pattern- NGT now removed. Pt placed on full clear liquid- nectar thick diet. RN reporting concern for aspiration and reported emesis across solid intake on 1/10.    SLP Plan  Continue with current plan of care      Recommendations for follow up therapy are one component of a multi-disciplinary discharge planning process, led by the attending physician.  Recommendations may be updated based on patient  status, additional functional criteria and insurance authorization.    Recommendations  Diet recommendations: Nectar-thick liquid Liquids provided via:  Cup Medication Administration: Whole meds with puree (vs crushed) Supervision: Staff to assist with self feeding;Full supervision/cueing for compensatory strategies (by spouse or staff) Compensations: Minimize environmental distractions;Slow rate;Small sips/bites Postural Changes and/or Swallow Maneuvers: Out of bed for meals;Upright 30-60 min after meal;Seated upright 90 degrees                  Oral care BID;Oral care before and after PO;Staff/trained caregiver to provide oral care   Frequent or constant Supervision/Assistance Dysphagia, pharyngeal phase (R13.13)     Continue with current plan of care    Mohamad Bruso Clapp  MS [facility] SLP   Laurie Penado J Clapp  03/16/2023, 9:44 AM

## 2023-03-16 NOTE — TOC Progression Note (Signed)
 Transition of Care [facility]) - Progression Note    Patient Details  Name: Elijah Murphy MRN: [id number] Date of Birth: 27-Sep-1955  [facility] CM/SW Contact  Odella Ku, RN Phone Number: 03/16/2023, 2:13 PM  Clinical Narrative:     Notified that patient and family would like to pursue hospice.  Spoke with wife, preference is [facility], Kara notified.   Expected Discharge Plan: Home w Home Health Services Barriers to Discharge: Continued Medical Work up  Expected Discharge Plan and Services       Living arrangements for the past 2 months: Single Family Home                                       Social Determinants of Health (SDOH) Interventions SDOH Screenings   Food Insecurity: No Food Insecurity (03/12/2023)  Housing: Low Risk  (03/12/2023)  Transportation Needs: No Transportation Needs (03/12/2023)  Utilities: Not At Risk (03/12/2023)  Alcohol  Screen: Low Risk  (03/19/2019)  Depression (PHQ2-9): Low Risk  (03/19/2019)  Financial Resource Strain: Low Risk  (05/09/2022)   Received from [facility], [facility]  Social Connections: Socially Integrated (03/12/2023)  Tobacco Use: High Risk (03/12/2023)    Readmission Risk Interventions     No data to display

## 2023-03-16 NOTE — Progress Notes (Signed)
 [facility] LIAISON NOTE   Received request from [facility], Transitions of Care Manager, for hospice services at home after discharge. Spoke with Clarita Fire, patient's wife, with plan to meet tomorrow morning to discuss hospice services.  Above information shared with Odella Ku, Transitions of Care Manager and hospital medical team.   Please call with any hospice related questions or concerns.  Thank you for the opportunity to participate in this patient's care.   Saddie HILARIO Na, RN Nurse Liaison [phone]

## 2023-03-17 DIAGNOSIS — Z515 Encounter for palliative care: Secondary | ICD-10-CM

## 2023-03-17 DIAGNOSIS — R0989 Other specified symptoms and signs involving the circulatory and respiratory systems: Secondary | ICD-10-CM | POA: Diagnosis not present

## 2023-03-17 MED ORDER — MAGIC MOUTHWASH: 15.0000 mL | Freq: Four times a day (QID) | ORAL | Status: DC | PRN

## 2023-03-17 MED ORDER — LORAZEPAM 2 MG/ML IJ SOLN
1.0000 mg | INTRAMUSCULAR | Status: DC | PRN
Start: 2023-03-17 — End: 2023-03-17
  Administered 2023-03-17 (×2): 1 mg via INTRAVENOUS
  Filled 2023-03-17 (×2): qty 1

## 2023-03-17 MED ORDER — ACETAMINOPHEN 325 MG PO TABS: 650.0000 mg | ORAL_TABLET | Freq: Four times a day (QID) | ORAL | Status: DC | PRN

## 2023-03-17 MED ORDER — MORPHINE SULFATE (CONCENTRATE) 10 MG /0.5 ML PO SOLN: 5.0000 mg | ORAL | Status: DC | PRN

## 2023-03-17 MED ORDER — ALUM & MAG HYDROXIDE-SIMETH 200-200-20 MG/5ML PO SUSP: 30.0000 mL | Freq: Four times a day (QID) | ORAL | Status: DC | PRN

## 2023-03-17 MED ORDER — GLYCOPYRROLATE 1 MG PO TABS
1.0000 mg | ORAL_TABLET | ORAL | Status: DC | PRN
Start: 2023-03-17 — End: 2023-03-17

## 2023-03-17 MED ORDER — LORAZEPAM 2 MG/ML PO CONC: 1.0000 mg | ORAL | Status: DC | PRN

## 2023-03-17 MED ORDER — POLYVINYL ALCOHOL 1.4 % OP SOLN: 1.0000 [drp] | Freq: Four times a day (QID) | OPHTHALMIC | Status: DC | PRN

## 2023-03-17 MED ORDER — GLYCOPYRROLATE 0.2 MG/ML IJ SOLN
0.2000 mg | INTRAMUSCULAR | Status: DC | PRN
  Administered 2023-03-17 (×2): 0.2 mg via INTRAVENOUS
  Filled 2023-03-17: qty 1

## 2023-03-17 MED ORDER — LORAZEPAM 1 MG PO TABS: 1.0000 mg | ORAL_TABLET | ORAL | 0 refills | Status: DC | PRN

## 2023-03-17 MED ORDER — BIOTENE DRY MOUTH MT LIQD: 15.0000 mL | OROMUCOSAL | Status: DC | PRN

## 2023-03-17 MED ORDER — LORAZEPAM 1 MG PO TABS: 1.0000 mg | ORAL_TABLET | ORAL | Status: DC | PRN

## 2023-03-17 MED ORDER — HYDROMORPHONE HCL 1 MG/ML IJ SOLN
0.5000 mg | INTRAMUSCULAR | Status: DC | PRN
  Administered 2023-03-17: 1 mg via INTRAVENOUS
  Filled 2023-03-17: qty 1

## 2023-03-17 MED ORDER — GLYCOPYRROLATE 1 MG PO TABS: 1.0000 mg | ORAL_TABLET | ORAL | 0 refills | Status: DC | PRN

## 2023-03-17 MED ORDER — ONDANSETRON HCL 4 MG PO TABS: 4.0000 mg | ORAL_TABLET | Freq: Every day | ORAL | 1 refills | Status: DC | PRN

## 2023-03-17 MED ORDER — ACETAMINOPHEN 650 MG RE SUPP: 650.0000 mg | Freq: Four times a day (QID) | RECTAL | Status: DC | PRN

## 2023-03-17 MED ORDER — MORPHINE SULFATE (CONCENTRATE) 10 MG /0.5 ML PO SOLN: 5.0000 mg | ORAL | 0 refills | Status: DC | PRN

## 2023-03-17 MED ORDER — GLYCOPYRROLATE 0.2 MG/ML IJ SOLN
0.2000 mg | INTRAMUSCULAR | Status: DC | PRN
  Filled 2023-03-17: qty 1

## 2023-03-17 MED ORDER — DIPHENHYDRAMINE HCL 50 MG/ML IJ SOLN: 12.5000 mg | INTRAMUSCULAR | Status: DC | PRN

## 2023-03-17 MED ORDER — NYSTATIN 100000 UNIT/GM EX POWD: Freq: Three times a day (TID) | CUTANEOUS | Status: DC | PRN

## 2023-03-17 MED ORDER — CHLORPROMAZINE HCL 25 MG PO TABS: 25.0000 mg | ORAL_TABLET | Freq: Three times a day (TID) | ORAL | 0 refills | Status: DC | PRN

## 2023-03-17 MED ORDER — ALBUTEROL SULFATE (2.5 MG/3ML) 0.083% IN NEBU: 2.5000 mg | INHALATION_SOLUTION | RESPIRATORY_TRACT | Status: DC | PRN

## 2023-03-17 NOTE — Progress Notes (Signed)
 [facility] Liaison Note  Follow up- DME has been delivered to patient's home.  Patient is ready for discharge home this evening.  Hospice admission visit set for tomorrow morning at 9am.   Thank you, Saddie HILARIO Na, RN Nurse Liaison [phone]

## 2023-03-17 NOTE — TOC Transition Note (Addendum)
 Transition of Care [facility]) - Discharge Note   Patient Details  Name: Elijah Murphy MRN: [id number] Date of Birth: [birth year]  Transition [facility] CM/SW Contact:  Odella Ku, RN Phone Number: 03/17/2023, 1:47 PM   Clinical Narrative:     Patient going home today with hospice.  Saddie M.with [facility] secured DME for the home, address confirmed.  Medical necessity form and Facesheet completed and printed.    Update: [facility] called for transport, he is 3rd on the list, bedside nurse aware.   Final next level of care: Home w Hospice Care Barriers to Discharge: Barriers Resolved   Patient Goals and CMS Choice Patient states their goals for this hospitalization and ongoing recovery are:: home with hospice          Discharge Placement                Patient to be transferred to facility by: [facility] Name of family member notified: LANNY, LIPKIN (Spouse)  [phone] Patient and family notified of of transfer: 03/17/23  Discharge Plan and Services Additional resources added to the After Visit Summary for                                       Social Drivers of Health (SDOH) Interventions SDOH Screenings   Food Insecurity: No Food Insecurity (03/12/2023)  Housing: Low Risk  (03/12/2023)  Transportation Needs: No Transportation Needs (03/12/2023)  Utilities: Not At Risk (03/12/2023)  Alcohol  Screen: Low Risk  (03/19/2019)  Depression (PHQ2-9): Low Risk  (03/19/2019)  Financial Resource Strain: Low Risk  (05/09/2022)   Received from [facility], [facility]  Social Connections: Socially Integrated (03/12/2023)  Tobacco Use: High Risk (03/12/2023)     Readmission Risk Interventions     No data to display

## 2023-03-17 NOTE — Progress Notes (Addendum)
 [facility] LIAISON NOTE   Received request from [facility], Transitions of Care Manager, for hospice services at home after discharge. Spoke with Clarita, patient's wife (spouse was listening) to initiate education related to hospice philosophy, services, and team approach to care. Patient/family verbalized understanding of information given. Per discussion, the plan is for discharge home by EMS after DME has been delivered.  DME needs discussed.  Patient has the following equipment in the home:  2 wheeled walker, BSC, shower chair  Patient/family requests the following equipment for delivery:   hospital bed with over bed table, transport wheelchair               The address has been verified and is correct in the chart.  Hammond Obeirne, spouse- is the family contact to arrange time of equipment delivery.  Please send signed and completed DNR home with patient/family if applicable.   Please provide prescriptions at discharge as needed to ensure ongoing symptom management.  [facility] information and contact numbers given to Clarita Fire, wife.  Above information shared with Odella Ku, Transitions of Care Manager and hospital medical team.  Please call with any hospice related questions or concerns.  Thank you for the opportunity to participate in this patient's care.  Saddie HILARIO Na, RN Nurse Liaison [phone]

## 2023-03-17 NOTE — Progress Notes (Signed)
 PROGRESS NOTE    Elijah Murphy  FMW:[id number] DOB: Jun 19, 1955 DOA: 03/12/2023 PCP: Franchot Houston, PA-C    Brief Narrative:  68 y.o. male with medical history significant of alcohol  abuse, hypertension, hyperlipidemia, right eye blindness presenting with left-sided blurry vision and left-sided hemiparesis concerning for suspected CVA.  History from both patient and wife at the bedside.  Per report, patient went left eye blurry vision with past 2 days or so.  Baseline right eye blindness.  Per the wife, patient chronically drinks  6 ounces of bourbon daily though he is minimally ambulatory.  Wife states she gets alcohol  for him.  Had new onset left-sided weakness around 2 AM per the patient.  positive difficulty with ambulation.  found down on the floor.  Chronic slurred speech.  No chest pain or shortness of breath.  No reported change in alcohol  intake. Chronic alcohol  related neuropathy.  No fevers or chills.  No abdominal pain.  No reported diarrhea.  Does not take aspirin .   1/12: Patient and wife met with hospice liaison.  Decision made to proceed home with hospice services as soon as feasible.  In the interim we will transition to full comfort measures. Assessment & Plan:   Principal Problem:   Suspected cerebrovascular accident (CVA) Active Problems:   Essential hypertension   Anemia   Alcohol  abuse   Unsteady gait   Ileus (HCC)   Malnutrition of moderate degree   Transition to full comfort measures.  All medications not focused on patient comfort have been discontinued..  Narcotics, benzodiazepines, anticholinergics.  DNR comfort.  Liberalize visitation.  Can discharge home with hospice services once all equipment has been delivered.  Hospice liaison aware.  DVT prophylaxis: Comfort Code Status: DNR Family Communication: Spouse at bedside Disposition Plan: Status is: Inpatient Remains inpatient appropriate because: Full comfort measures.  Awaiting discharge home with  hospice   Level of care: Telemetry Medical  Consultants:  Palliative care  Procedures:  None  Antimicrobials: None   Subjective: Seen and examined.  Appears weak and fatigued.  Objective: Vitals:   03/17/23 0038 03/17/23 0500 03/17/23 0524 03/17/23 0729  BP: (!) 139/90  124/87 (!) 141/93  Pulse: 93  84 85  Resp: 16  18 16   Temp: 98.5 F (36.9 C)  98.3 F (36.8 C) 97.7 F (36.5 C)  TempSrc:      SpO2: 93%  94% 95%  Weight:  80.5 kg    Height:        Intake/Output Summary (Last 24 hours) at 03/17/2023 1201 Last data filed at 03/17/2023 0700 Gross per 24 hour  Intake 642.29 ml  Output 800 ml  Net -157.71 ml   Filed Weights   03/12/23 2125 03/15/23 1508 03/17/23 0500  Weight: 80.6 kg 77.3 kg 80.5 kg    Examination:  General exam: Appears ill and fatigued.  Appears anxious Respiratory system: Increased work of breathing.  Coarse breath sounds.  2 L Cardiovascular system: S1-2, RRR, no murmurs, no pedal edema Gastrointestinal system: Soft, NT/ND, normal bowel sounds,+ NGT Central nervous system: Alert but lethargic.  Oriented x 2.  No focal deficits Extremities: Symmetric 5 x 5 power. Skin: No rashes, lesions or ulcers Psychiatry: Judgement and insight appear impaired. Mood & affect flattened.     Data Reviewed: I have personally reviewed following labs and imaging studies  CBC: Recent Labs  Lab 03/12/23 0902 03/14/23 0603  WBC 5.6 9.2  HGB 14.8 14.1  HCT 43.3 39.3  MCV 100.5* 96.3  PLT  149* 148*   Basic Metabolic Panel: Recent Labs  Lab 03/12/23 0902 03/13/23 1228 03/14/23 0603 03/15/23 0459 03/16/23 0447  NA 138 138 136 141 140  K 4.7 3.3* 3.2* 3.3* 3.0*  CL 103 101 100 106 107  CO2 21* 23 24 25 24   GLUCOSE 143* 137* 142* 119* 114*  BUN 13 11 16 14 14   CREATININE 0.99 0.84 0.85 0.95 0.90  CALCIUM 8.7* 8.9 8.4* 8.4* 8.3*  MG 1.7 1.8 1.7 2.1 1.9  PHOS  --  3.0  --  3.0 3.0   GFR: Estimated Creatinine Clearance: 79.6 mL/min (by C-G  formula based on SCr of 0.9 mg/dL). Liver Function Tests: Recent Labs  Lab 03/12/23 0902  AST 35  ALT 30  ALKPHOS 47  BILITOT 1.5*  PROT 7.1  ALBUMIN 4.3   Recent Labs  Lab 03/15/23 0459  LIPASE 27   Recent Labs  Lab 03/12/23 2031  AMMONIA 12   Coagulation Profile: Recent Labs  Lab 03/12/23 0902  INR 1.1   Cardiac Enzymes: No results for input(s): CKTOTAL, CKMB, CKMBINDEX, TROPONINI in the last 168 hours. BNP (last 3 results) No results for input(s): PROBNP in the last 8760 hours. HbA1C: No results for input(s): HGBA1C in the last 72 hours.  CBG: No results for input(s): GLUCAP in the last 168 hours. Lipid Profile: No results for input(s): CHOL, HDL, LDLCALC, TRIG, CHOLHDL, LDLDIRECT in the last 72 hours.  Thyroid Function Tests: No results for input(s): TSH, T4TOTAL, FREET4, T3FREE, THYROIDAB in the last 72 hours. Anemia Panel: Recent Labs    03/16/23 0447  VITAMINB12 269   Sepsis Labs: No results for input(s): PROCALCITON, LATICACIDVEN in the last 168 hours.  No results found for this or any previous visit (from the past 240 hours).       Radiology Studies: No results found.       Scheduled Meds:   Continuous Infusions:  promethazine  (PHENERGAN ) injection (IM or IVPB) 12.5 mg (03/14/23 1354)     LOS: 5 days    Calvin KATHEE Robson, MD [facility]   If 7PM-7AM, please contact night-coverage  03/17/2023, 12:01 PM

## 2023-03-17 NOTE — Discharge Summary (Signed)
 Physician Discharge Summary  Elijah Murphy FMW:[id number] DOB: 07/06/1955 DOA: 03/12/2023  PCP: Franchot Houston, PA-C  Admit date: 03/12/2023 Discharge date: 03/17/2023  Admitted From: Home Disposition:  Home with hospice  Recommendations for Outpatient Follow-up:  Per hospice   Home Health:NA  Equipment/Devices:Oxygen    Discharge Condition:Hospice  CODE STATUS:DNR  Diet recommendation: Comfort  Brief/Interim Summary:  68 y.o. male with medical history significant of alcohol  abuse, hypertension, hyperlipidemia, right eye blindness presenting with left-sided blurry vision and left-sided hemiparesis concerning for suspected CVA.  History from both patient and wife at the bedside.  Per report, patient went left eye blurry vision with past 2 days or so.  Baseline right eye blindness.  Per the wife, patient chronically drinks  6 ounces of bourbon daily though he is minimally ambulatory.  Wife states she gets alcohol  for him.  Had new onset left-sided weakness around 2 AM per the patient.  positive difficulty with ambulation.  found down on the floor.  Chronic slurred speech.  No chest pain or shortness of breath.  No reported change in alcohol  intake. Chronic alcohol  related neuropathy.  No fevers or chills.  No abdominal pain.  No reported diarrhea.  Does not take aspirin .    1/12: Patient and wife met with hospice liaison.  Decision made to proceed home with hospice services as soon as feasible.  In the interim we will transition to full comfort measures.  Confirmation of home DME was delivered.  Patient discharge home with hospice services.  DNR in place.  All comfort medications sent to [facility].   Discharge Diagnoses:  Principal Problem:   Suspected cerebrovascular accident (CVA) Active Problems:   Essential hypertension   Anemia   Alcohol  abuse   Unsteady gait   Ileus (HCC)   Malnutrition of moderate degree   Hospice care patient   Palliative care patient  Patient was  transition to comfort measures on 1/12 prior to discharge in the hospital.  All comfort medications have been sent to outpatient pharmacy.  Care to be assumed by hospice team on 1/13.  Discharge Instructions  Discharge Instructions     Diet - low sodium heart healthy   Complete by: As directed    Increase activity slowly   Complete by: As directed       Allergies as of 03/17/2023       Reactions   Sulfa Antibiotics Hives        Medication List     STOP taking these medications    cyanocobalamin 1000 MCG tablet   lisinopril  20 MG tablet Commonly known as: ZESTRIL    mirtazapine  15 MG disintegrating tablet Commonly known as: REMERON  SOL-TAB   ondansetron  4 MG disintegrating tablet Commonly known as: ZOFRAN -ODT   rosuvastatin 20 MG tablet Commonly known as: CRESTOR   sertraline  25 MG tablet Commonly known as: ZOLOFT    silodosin  4 MG Caps capsule Commonly known as: RAPAFLO        TAKE these medications    chlorproMAZINE  25 MG tablet Commonly known as: THORAZINE  Take 1 tablet (25 mg total) by mouth 3 (three) times daily as needed for hiccoughs.   gabapentin 100 MG capsule Commonly known as: NEURONTIN Take 200 mg by mouth 3 (three) times daily as needed.   glycopyrrolate  1 MG tablet Commonly known as: ROBINUL  Take 1 tablet (1 mg total) by mouth every 4 (four) hours as needed (excessive secretions).   LORazepam  1 MG tablet Commonly known as: ATIVAN  Take 1 tablet (1 mg  total) by mouth every 4 (four) hours as needed for anxiety.   morphine  CONCENTRATE 10 mg / 0.5 ml concentrated solution Take 0.25 mLs (5 mg total) by mouth every 2 (two) hours as needed for moderate pain (pain score 4-6) (or dyspnea).   ondansetron  4 MG tablet Commonly known as: Zofran  Take 1 tablet (4 mg total) by mouth daily as needed for nausea or vomiting.        Follow-up Information     Franchot Houston, PA-C Follow up.   Specialty: Physician Assistant Why: Hospital follow  up Contact information: [address] [place] [address] [phone]                Allergies  Allergen Reactions   Sulfa Antibiotics Hives    Consultations: Palliative care   Procedures/Studies: DG Abd 1 View Result Date: 03/15/2023 CLINICAL DATA:  68 year old male with ileus.  Neurologic deficits. EXAM: ABDOMEN - 1 VIEW COMPARISON:  03/14/2023 and earlier. FINDINGS: Portable AP supine views at 1100 hours. Negative lung bases. Satisfactory enteric tube placement into the stomach, side hole at the gastric body level. Non obstructed bowel gas pattern. Abdominal and pelvic visceral contours within normal limits. No acute osseous abnormality identified. IMPRESSION: 1. Satisfactory enteric tube placement in the stomach. 2. Normal bowel gas pattern. Electronically Signed   By: VEAR Hurst M.D.   On: 03/15/2023 12:12   DG Abd 1 View Result Date: 03/14/2023 CLINICAL DATA:  Check gastric catheter placement EXAM: ABDOMEN - 1 VIEW COMPARISON:  Film from earlier in the same day FINDINGS: Gastric catheter now lies within the stomach. Proximal side port is in the stomach as well. IMPRESSION: Gastric catheter deeper within the stomach in satisfactory position. Electronically Signed   By: Oneil Devonshire M.D.   On: 03/14/2023 19:57   DG Abd 1 View Result Date: 03/14/2023 CLINICAL DATA:  NG tube placement EXAM: ABDOMEN - 1 VIEW limited for tube placement COMPARISON:  03/14/2023 earlier FINDINGS: Interval placement of enteric tube. Tip overlies the midbody of the stomach. Side hole near the GE junction. This could be advanced further into the stomach. Elsewhere there are some air-filled loops of bowel in the upper abdomen. Films are under penetrated. Overlapping cardiac leads. IMPRESSION: Limited x-ray for tube placement has tip overlying the midbody of the stomach. Side-hole at the GE junction. Recommend further advancement into the stomach Electronically Signed   By: Ranell Bring  M.D.   On: 03/14/2023 18:28   DG Abd 1 View Result Date: 03/14/2023 CLINICAL DATA:  Abdominal distension EXAM: ABDOMEN - 1 VIEW COMPARISON:  10/17/2019 FINDINGS: Scattered large and small bowel gas is noted. No obstructive changes are seen. No free air is noted. Bony structures are unremarkable. No abnormal mass is seen. IMPRESSION: No acute abnormality noted. Electronically Signed   By: Oneil Devonshire M.D.   On: 03/14/2023 17:21   DG Chest Port 1 View Result Date: 03/13/2023 CLINICAL DATA:  Cough. EXAM: PORTABLE CHEST 1 VIEW COMPARISON:  Chest radiograph dated 12/05/2022. FINDINGS: No focal consolidation, pleural effusion, or pneumothorax. The cardiac silhouette is within limits. No acute osseous pathology. IMPRESSION: No active disease. Electronically Signed   By: Vanetta Chou M.D.   On: 03/13/2023 16:17   ECHOCARDIOGRAM COMPLETE Result Date: 03/12/2023    ECHOCARDIOGRAM REPORT   Patient Name:   Elijah Murphy Date of Exam: 03/12/2023 Medical Rec #:  [id number]       Height:       69.0 in  Accession #:    [id number]      Weight:       162.0 lb Date of Birth:  [birth year]        BSA:          1.889 m Patient Age:    67 years        BP:           198/96 mmHg Patient Gender: M               HR:           60 bpm. Exam Location:  [facility] Procedure: 2D Echo, Cardiac Doppler, Color Doppler and Saline Contrast Bubble            Study Indications:     TIA G45.9  History:         Patient has prior history of Echocardiogram examinations, most                  recent 01/27/2022. Risk Factors:Dyslipidemia and Hypertension.  Sonographer:     Christopher Furnace Referring Phys:  [id number] PARAS [place]WTON Diagnosing Phys: Lonni Hanson MD IMPRESSIONS  1. Left ventricular ejection fraction, by estimation, is 60 to 65%. The left ventricle has normal function. The left ventricle has no regional wall motion abnormalities. There is mild left ventricular hypertrophy. Left ventricular diastolic parameters are consistent with Grade I diastolic  dysfunction (impaired relaxation).  2. Right ventricular systolic function is normal. The right ventricular size is normal. Tricuspid regurgitation signal is inadequate for assessing PA pressure.  3. The mitral valve was not well visualized. Mild mitral valve regurgitation. No evidence of mitral stenosis.  4. The aortic valve is tricuspid. Aortic valve regurgitation is not visualized. No aortic stenosis is present.  5. There is borderline dilatation of the ascending aorta, measuring 38 mm.  6. Agitated saline contrast bubble study was negative, with no evidence of any interatrial shunt. FINDINGS  Left Ventricle: Left ventricular ejection fraction, by estimation, is 60 to 65%. The left ventricle has normal function. The left ventricle has no regional wall motion abnormalities. The left ventricular internal cavity size was normal in size. There is  mild left ventricular hypertrophy. Left ventricular diastolic parameters are consistent with Grade I diastolic dysfunction (impaired relaxation). Right Ventricle: The right ventricular size is normal. No increase in right ventricular wall thickness. Right ventricular systolic function is normal. Tricuspid regurgitation signal is inadequate for assessing PA pressure. Left Atrium: Left atrial size was normal in size. Right Atrium: Right atrial size was normal in size. Pericardium: The pericardium was not well visualized. Mitral Valve: The mitral valve was not well visualized. Mild mitral annular calcification. Mild mitral valve regurgitation. No evidence of mitral valve stenosis. Tricuspid Valve: The tricuspid valve is grossly normal. Tricuspid valve regurgitation is trivial. Aortic Valve: The aortic valve is tricuspid. Aortic valve regurgitation is not visualized. No aortic stenosis is present. Aortic valve mean gradient measures 2.0 mmHg. Aortic valve peak gradient measures 3.2 mmHg. Aortic valve area, by VTI measures 4.99 cm. Pulmonic Valve: The pulmonic valve was not  well visualized. Pulmonic valve regurgitation is not visualized. No evidence of pulmonic stenosis. Aorta: The aortic root is normal in size and structure. There is borderline dilatation of the ascending aorta, measuring 38 mm. Pulmonary Artery: The pulmonary artery is of normal size. Venous: The inferior vena cava was not well visualized. IAS/Shunts: The interatrial septum was not well visualized. Agitated saline contrast was given intravenously to evaluate for intracardiac  shunting. Agitated saline contrast bubble study was negative, with no evidence of any interatrial shunt.  LEFT VENTRICLE PLAX 2D LVIDd:         4.90 cm   Diastology LVIDs:         3.40 cm   LV e' medial:    7.18 cm/s LV PW:         1.30 cm   LV E/e' medial:  8.9 LV IVS:        1.20 cm   LV e' lateral:   6.09 cm/s LVOT diam:     2.30 cm   LV E/e' lateral: 10.5 LV SV:         90 LV SV Index:   48 LVOT Area:     4.15 cm  RIGHT VENTRICLE RV Basal diam:  2.90 cm RV Mid diam:    2.20 cm LEFT ATRIUM             Index        RIGHT ATRIUM           Index LA diam:        2.80 cm 1.48 cm/m   RA Area:     12.10 cm LA Vol (A2C):   23.3 ml 12.33 ml/m  RA Volume:   23.60 ml  12.49 ml/m LA Vol (A4C):   29.1 ml 15.40 ml/m LA Biplane Vol: 28.4 ml 15.03 ml/m  AORTIC VALVE AV Area (Vmax):    4.32 cm AV Area (Vmean):   3.68 cm AV Area (VTI):     4.99 cm AV Vmax:           89.00 cm/s AV Vmean:          65.850 cm/s AV VTI:            0.180 m AV Peak Grad:      3.2 mmHg AV Mean Grad:      2.0 mmHg LVOT Vmax:         92.60 cm/s LVOT Vmean:        58.300 cm/s LVOT VTI:          0.217 m LVOT/AV VTI ratio: 1.20  AORTA Ao Root diam: 3.59 cm Ao Asc diam:  3.80 cm MITRAL VALVE MV Area (PHT): 4.54 cm    SHUNTS MV Decel Time: 167 msec    Systemic VTI:  0.22 m MV E velocity: 63.90 cm/s  Systemic Diam: 2.30 cm MV A velocity: 95.90 cm/s MV E/A ratio:  0.67 Lonni End MD Electronically signed by Lonni Hanson MD Signature Date/Time: 03/12/2023/2:25:33 PM    Final     CT ANGIO HEAD NECK W WO CM Result Date: 03/12/2023 CLINICAL DATA:  Stroke presentation with gait disturbance and visual disturbance. MRI do not show any acute brain insult. Abnormal flow seen in the left vertebral artery. EXAM: CT ANGIOGRAPHY HEAD AND NECK WITH AND WITHOUT CONTRAST TECHNIQUE: Multidetector CT imaging of the head and neck was performed using the standard protocol during bolus administration of intravenous contrast. Multiplanar CT image reconstructions and MIPs were obtained to evaluate the vascular anatomy. Carotid stenosis measurements (when applicable) are obtained utilizing NASCET criteria, using the distal internal carotid diameter as the denominator. RADIATION DOSE REDUCTION: This exam was performed according to the departmental dose-optimization program which includes automated exposure control, adjustment of the mA and/or kV according to patient size and/or use of iterative reconstruction technique. CONTRAST:  75mL OMNIPAQUE  IOHEXOL  350 MG/ML SOLN COMPARISON:  CT and MRI studies same day  FINDINGS: CTA NECK FINDINGS Aortic arch: Aortic atherosclerosis. Branching pattern is normal without origin stenosis. Right carotid system: Common carotid artery shows some scattered plaque but is widely patent to the bifurcation. Soft and calcified plaque at the carotid bifurcation and ICA bulb but no stenosis. Cervical ICA widely patent beyond that. Left carotid system: Common carotid artery shows scattered plaque but is widely patent to the bifurcation. Calcified plaque at the carotid bifurcation and ICA bulb but no stenosis. Cervical ICA widely patent beyond that. Vertebral arteries: Calcified plaque at the right vertebral artery origin with stenosis estimated at 70%. Beyond that, the vessel is patent through the cervical region to the foramen magnum. The left vertebral artery origin is difficult to evaluate accurately because of the presence of dense venous reflux. I think the origin is stenotic but  patent. Flow can be seen within the vessel to the mid cervical region, but no flow demonstrated distal to that. Skeleton: Negative Other neck: No mass or lymphadenopathy. Upper chest: No active process. Review of the MIP images confirms the above findings CTA HEAD FINDINGS Anterior circulation: Both internal carotid arteries are patent through the skull base and siphon regions. There is siphon atherosclerotic calcification with stenosis estimated at 50% in both distal siphon regions. The anterior and middle cerebral vessels are patent. No large vessel occlusion or proximal flow limiting stenosis. No aneurysm or vascular malformation. Posterior circulation: The right vertebral artery is patent through the foramen magnum to the basilar artery. No antegrade flow is demonstrated in the left vertebral artery through the foramen magnum. There is a small amount of distal left vertebral artery retrograde flow. No basilar stenosis. Superior cerebellar and posterior cerebral vessels show flow. There is stenotic disease in both posterior cerebral arteries, more severe on the left than the right. Venous sinuses: Patent and normal. Anatomic variants: None significant. Review of the MIP images confirms the above findings IMPRESSION: 1. Aortic atherosclerosis. 2. Atherosclerotic disease at both carotid bifurcations but without stenosis. 3. 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 4. The left vertebral artery origin is difficult to evaluate accurately because of the presence of dense venous reflux. I think the origin is stenotic but patent. Flow can be seen within the vessel to the mid cervical region, but no flow demonstrated distal to that. There is a small amount of distal left vertebral artery retrograde flow. 5. No intracranial large vessel occlusion or proximal flow limiting stenosis. 6. 50% stenosis in both distal internal carotid artery siphon regions. 7. Stenosis in both posterior cerebral arteries, more severe  on the left than the right. Aortic Atherosclerosis (ICD10-I70.0). Electronically Signed   By: Oneil Officer M.D.   On: 03/12/2023 12:47   MR BRAIN WO CONTRAST Result Date: 03/12/2023 CLINICAL DATA:  Left eye vision impairment, leaning to the left, stroke suspected EXAM: MRI HEAD WITHOUT CONTRAST TECHNIQUE: Multiplanar, multiecho pulse sequences of the brain and surrounding structures were obtained without intravenous contrast. COMPARISON:  05/22/2013 MRI head, correlation is also made with 03/12/2023 CT head FINDINGS: Brain: No restricted diffusion to suggest acute or subacute infarct. No acute hemorrhage, mass, mass effect, or midline shift. No hydrocephalus or extra-axial collection. Craniocervical junction within normal limits. Multiple foci of hemosiderin deposition, most prominently in pons (series 10, image 20), but also in the cerebellum, bilateral temporal lobes, left inferior frontal lobe, and left occipital lobe, likely sequela of prior hypertensive microhemorrhages. 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 Confluent T2 hyperintense signal in the periventricular white matter,  likely the sequela of moderate chronic small vessel ischemic disease. Remote lacunar infarcts in the pons and right thalamus. Vascular: Loss of the left vertebral artery void (series 8, image 2). Otherwise normal arterial flow voids. Skull and upper cervical spine: Normal marrow signal. Sinuses/Orbits: Mucous retention cyst maxillary sinuses. Mucosal thickening in the ethmoid air cells and right frontal sinus. No acute finding in the orbits. Other: Trace fluid in the right mastoid air cells. IMPRESSION: 1. No acute intracranial process. No evidence of acute or subacute infarct. 2. Loss of the left vertebral artery void, which is nonspecific but can be seen in the setting of vertebral artery occlusion or slow flow. This could be further evaluated with a CTA of the head and neck. Electronically Signed   By: Joanathan Campion M.D.    On: 03/12/2023 12:16   CT HEAD WO CONTRAST ( ) Result Date: 03/12/2023 CLINICAL DATA:  Neuro deficit, acute, stroke suspected. Difficulty walking. Visual disturbance. EXAM: CT HEAD WITHOUT CONTRAST TECHNIQUE: Contiguous axial images were obtained from the base of the skull through the vertex without intravenous contrast. RADIATION DOSE REDUCTION: This exam was performed according to the departmental dose-optimization program which includes automated exposure control, adjustment of the mA and/or kV according to patient size and/or use of iterative reconstruction technique. COMPARISON:  01/25/2022 FINDINGS: Brain: Moderate atrophic changes. Advanced chronic small-vessel ischemic changes of the hemispheric white matter. No sign of acute infarction, mass lesion, hemorrhage, hydrocephalus or extra-axial collection. Vascular: There is atherosclerotic calcification of the major vessels at the base of the brain. Skull: Negative Sinuses/Orbits: Clear/normal Other: None IMPRESSION: No acute CT finding. Moderate atrophic changes. Advanced chronic small-vessel ischemic changes of the hemispheric white matter. Electronically Signed   By: Oneil Officer M.D.   On: 03/12/2023 09:52      Subjective: Seen and examined on the day of discharge.  Appropriate for discharge home with hospice services.  Discharge Exam: Vitals:   03/17/23 0524 03/17/23 0729  BP: 124/87 (!) 141/93  Pulse: 84 85  Resp: 18 16  Temp: 98.3 F (36.8 C) 97.7 F (36.5 C)  SpO2: 94% 95%   Vitals:   03/17/23 0038 03/17/23 0500 03/17/23 0524 03/17/23 0729  BP: (!) 139/90  124/87 (!) 141/93  Pulse: 93  84 85  Resp: 16  18 16   Temp: 98.5 F (36.9 C)  98.3 F (36.8 C) 97.7 F (36.5 C)  TempSrc:      SpO2: 93%  94% 95%  Weight:  80.5 kg    Height:        General: Pt is alert, awake, not in acute distress Cardiovascular: RRR, S1/S2 +, no rubs, no gallops Respiratory: CTA bilaterally, no wheezing, no rhonchi Abdominal: Soft, NT, ND,  bowel sounds + Extremities: no edema, no cyanosis    The results of significant diagnostics from this hospitalization (including imaging, microbiology, ancillary and laboratory) are listed below for reference.     Microbiology: No results found for this or any previous visit (from the past 240 hours).   Labs: BNP (last 3 results) No results for input(s): BNP in the last 8760 hours. Basic Metabolic Panel: Recent Labs  Lab 03/12/23 0902 03/13/23 1228 03/14/23 0603 03/15/23 0459 03/16/23 0447  NA 138 138 136 141 140  K 4.7 3.3* 3.2* 3.3* 3.0*  CL 103 101 100 106 107  CO2 21* 23 24 25 24   GLUCOSE 143* 137* 142* 119* 114*  BUN 13 11 16 14 14   CREATININE 0.99 0.84 0.85 0.95 0.90  CALCIUM 8.7* 8.9 8.4* 8.4* 8.3*  MG 1.7 1.8 1.7 2.1 1.9  PHOS  --  3.0  --  3.0 3.0   Liver Function Tests: Recent Labs  Lab 03/12/23 0902  AST 35  ALT 30  ALKPHOS 47  BILITOT 1.5*  PROT 7.1  ALBUMIN 4.3   Recent Labs  Lab 03/15/23 0459  LIPASE 27   Recent Labs  Lab 03/12/23 2031  AMMONIA 12   CBC: Recent Labs  Lab 03/12/23 0902 03/14/23 0603  WBC 5.6 9.2  HGB 14.8 14.1  HCT 43.3 39.3  MCV 100.5* 96.3  PLT 149* 148*   Cardiac Enzymes: No results for input(s): CKTOTAL, CKMB, CKMBINDEX, TROPONINI in the last 168 hours. BNP: Invalid input(s): POCBNP CBG: No results for input(s): GLUCAP in the last 168 hours. D-Dimer No results for input(s): DDIMER in the last 72 hours. Hgb A1c No results for input(s): HGBA1C in the last 72 hours. Lipid Profile No results for input(s): CHOL, HDL, LDLCALC, TRIG, CHOLHDL, LDLDIRECT in the last 72 hours. Thyroid function studies No results for input(s): TSH, T4TOTAL, T3FREE, THYROIDAB in the last 72 hours.  Invalid input(s): FREET3 Anemia work up Recent Labs    03/16/23 0447  VITAMINB12 269   Urinalysis    Component Value Date/Time   COLORURINE YELLOW (A) 03/12/2023 2355   APPEARANCEUR CLEAR  (A) 03/12/2023 2355   LABSPEC 1.033 (H) 03/12/2023 2355   PHURINE 7.0 03/12/2023 2355   GLUCOSEU 50 (A) 03/12/2023 2355   HGBUR LARGE (A) 03/12/2023 2355   BILIRUBINUR NEGATIVE 03/12/2023 2355   KETONESUR 20 (A) 03/12/2023 2355   PROTEINUR NEGATIVE 03/12/2023 2355   NITRITE NEGATIVE 03/12/2023 2355   LEUKOCYTESUR NEGATIVE 03/12/2023 2355   Sepsis Labs Recent Labs  Lab 03/12/23 0902 03/14/23 0603  WBC 5.6 9.2   Microbiology No results found for this or any previous visit (from the past 240 hours).   Time coordinating discharge: Over 30 minutes  SIGNED:   Calvin KATHEE Robson, MD  [facility] 03/17/2023, 12:23 PM Pager   If 7PM-7AM, please contact night-coverage

## 2023-04-06 DEATH — deceased
